# Patient Record
Sex: Male | Born: 1960
Health system: Southern US, Community
[De-identification: ages and names within clinical notes are randomized; demographics above are authoritative.]

## PROBLEM LIST (undated history)

## (undated) DIAGNOSIS — Z8701 Personal history of pneumonia (recurrent): Secondary | ICD-10-CM

## (undated) DIAGNOSIS — Z87442 Personal history of urinary calculi: Secondary | ICD-10-CM

## (undated) DIAGNOSIS — J189 Pneumonia, unspecified organism: Secondary | ICD-10-CM

## (undated) DIAGNOSIS — M1990 Unspecified osteoarthritis, unspecified site: Secondary | ICD-10-CM

## (undated) DIAGNOSIS — N200 Calculus of kidney: Secondary | ICD-10-CM

## (undated) DIAGNOSIS — I10 Essential (primary) hypertension: Secondary | ICD-10-CM

## (undated) DIAGNOSIS — R7303 Prediabetes: Secondary | ICD-10-CM

## (undated) DIAGNOSIS — E785 Hyperlipidemia, unspecified: Secondary | ICD-10-CM

## (undated) DIAGNOSIS — M519 Unspecified thoracic, thoracolumbar and lumbosacral intervertebral disc disorder: Secondary | ICD-10-CM

## (undated) DIAGNOSIS — I2510 Atherosclerotic heart disease of native coronary artery without angina pectoris: Secondary | ICD-10-CM

## (undated) HISTORY — DX: Essential (primary) hypertension: I10

## (undated) HISTORY — DX: Calculus of kidney: N20.0

---
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
## 2007-09-10 ENCOUNTER — Ambulatory Visit (HOSPITAL_COMMUNITY): Admission: RE | Admit: 2007-09-10 | Discharge: 2007-09-10 | Payer: Self-pay | Admitting: Pulmonary Disease

## 2008-05-21 ENCOUNTER — Emergency Department (HOSPITAL_COMMUNITY): Admission: EM | Admit: 2008-05-21 | Discharge: 2008-05-21 | Payer: Self-pay | Admitting: Emergency Medicine

## 2011-09-08 LAB — URINALYSIS, ROUTINE W REFLEX MICROSCOPIC
Bilirubin Urine: NEGATIVE
Glucose, UA: NEGATIVE
Ketones, ur: NEGATIVE
Leukocytes, UA: NEGATIVE
Nitrite: NEGATIVE
Protein, ur: NEGATIVE
Specific Gravity, Urine: >1.03 — ABNORMAL HIGH
Urobilinogen, UA: 0.2
pH: 5

## 2011-09-08 LAB — URINE MICROSCOPIC-ADD ON

## 2017-09-04 ENCOUNTER — Encounter: Payer: Self-pay | Admitting: Family Medicine

## 2017-09-04 ENCOUNTER — Ambulatory Visit (INDEPENDENT_AMBULATORY_CARE_PROVIDER_SITE_OTHER): Payer: 59 | Admitting: Family Medicine

## 2017-09-04 VITALS — BP 156/100 | Ht 68.0 in | Wt 246.1 lb

## 2017-09-04 DIAGNOSIS — I10 Essential (primary) hypertension: Secondary | ICD-10-CM | POA: Diagnosis not present

## 2017-09-04 DIAGNOSIS — R5383 Other fatigue: Secondary | ICD-10-CM | POA: Diagnosis not present

## 2017-09-04 MED ORDER — AMLODIPINE BESYLATE 5 MG PO TABS: 5.0000 mg | ORAL_TABLET | Freq: Every day | ORAL | 5 refills | Status: DC

## 2017-09-04 NOTE — Patient Instructions (Signed)
DASH Eating Plan DASH stands for "Dietary Approaches to Stop Hypertension." The DASH eating plan is a healthy eating plan that has been shown to reduce high blood pressure (hypertension). It may also reduce your risk for type 2 diabetes, heart disease, and stroke. The DASH eating plan may also help with weight loss. What are tips for following this plan? General guidelines  Avoid eating more than 2,300 mg (milligrams) of salt (sodium) a day. If you have hypertension, you may need to reduce your sodium intake to 1,500 mg a day.  Limit alcohol intake to no more than 1 drink a day for nonpregnant women and 2 drinks a day for men. One drink equals 12 oz of beer, 5 oz of wine, or 1 oz of hard liquor.  Work with your health care provider to maintain a healthy body weight or to lose weight. Ask what an ideal weight is for you.  Get at least 30 minutes of exercise that causes your heart to beat faster (aerobic exercise) most days of the week. Activities may include walking, swimming, or biking.  Work with your health care provider or diet and nutrition specialist (dietitian) to adjust your eating plan to your individual calorie needs. Reading food labels  Check food labels for the amount of sodium per serving. Choose foods with less than 5 percent of the Daily Value of sodium. Generally, foods with less than 300 mg of sodium per serving fit into this eating plan.  To find whole grains, look for the word "whole" as the first word in the ingredient list. Shopping  Buy products labeled as "low-sodium" or "no salt added."  Buy fresh foods. Avoid canned foods and premade or frozen meals. Cooking  Avoid adding salt when cooking. Use salt-free seasonings or herbs instead of table salt or sea salt. Check with your health care provider or pharmacist before using salt substitutes.  Do not fry foods. Cook foods using healthy methods such as baking, boiling, grilling, and broiling instead.  Cook with  heart-healthy oils, such as olive, canola, soybean, or sunflower oil. Meal planning   Eat a balanced diet that includes: ? 5 or more servings of fruits and vegetables each day. At each meal, try to fill half of your plate with fruits and vegetables. ? Up to 6-8 servings of whole grains each day. ? Less than 6 oz of lean meat, poultry, or fish each day. A 3-oz serving of meat is about the same size as a deck of cards. One egg equals 1 oz. ? 2 servings of low-fat dairy each day. ? A serving of nuts, seeds, or beans 5 times each week. ? Heart-healthy fats. Healthy fats called Omega-3 fatty acids are found in foods such as flaxseeds and coldwater fish, like sardines, salmon, and mackerel.  Limit how much you eat of the following: ? Canned or prepackaged foods. ? Food that is high in trans fat, such as fried foods. ? Food that is high in saturated fat, such as fatty meat. ? Sweets, desserts, sugary drinks, and other foods with added sugar. ? Full-fat dairy products.  Do not salt foods before eating.  Try to eat at least 2 vegetarian meals each week.  Eat more home-cooked food and less restaurant, buffet, and fast food.  When eating at a restaurant, ask that your food be prepared with less salt or no salt, if possible. What foods are recommended? The items listed may not be a complete list. Talk with your dietitian about what   dietary choices are best for you. Grains Whole-grain or whole-wheat bread. Whole-grain or whole-wheat pasta. Brown rice. Oatmeal. Quinoa. Bulgur. Whole-grain and low-sodium cereals. Pita bread. Low-fat, low-sodium crackers. Whole-wheat flour tortillas. Vegetables Fresh or frozen vegetables (raw, steamed, roasted, or grilled). Low-sodium or reduced-sodium tomato and vegetable juice. Low-sodium or reduced-sodium tomato sauce and tomato paste. Low-sodium or reduced-sodium canned vegetables. Fruits All fresh, dried, or frozen fruit. Canned fruit in natural juice (without  added sugar). Meat and other protein foods Skinless chicken or turkey. Ground chicken or turkey. Pork with fat trimmed off. Fish and seafood. Egg whites. Dried beans, peas, or lentils. Unsalted nuts, nut butters, and seeds. Unsalted canned beans. Lean cuts of beef with fat trimmed off. Low-sodium, lean deli meat. Dairy Low-fat (1%) or fat-free (skim) milk. Fat-free, low-fat, or reduced-fat cheeses. Nonfat, low-sodium ricotta or cottage cheese. Low-fat or nonfat yogurt. Low-fat, low-sodium cheese. Fats and oils Soft margarine without trans fats. Vegetable oil. Low-fat, reduced-fat, or light mayonnaise and salad dressings (reduced-sodium). Canola, safflower, olive, soybean, and sunflower oils. Avocado. Seasoning and other foods Herbs. Spices. Seasoning mixes without salt. Unsalted popcorn and pretzels. Fat-free sweets. What foods are not recommended? The items listed may not be a complete list. Talk with your dietitian about what dietary choices are best for you. Grains Baked goods made with fat, such as croissants, muffins, or some breads. Dry pasta or rice meal packs. Vegetables Creamed or fried vegetables. Vegetables in a cheese sauce. Regular canned vegetables (not low-sodium or reduced-sodium). Regular canned tomato sauce and paste (not low-sodium or reduced-sodium). Regular tomato and vegetable juice (not low-sodium or reduced-sodium). Pickles. Olives. Fruits Canned fruit in a light or heavy syrup. Fried fruit. Fruit in cream or butter sauce. Meat and other protein foods Fatty cuts of meat. Ribs. Fried meat. Bacon. Sausage. Bologna and other processed lunch meats. Salami. Fatback. Hotdogs. Bratwurst. Salted nuts and seeds. Canned beans with added salt. Canned or smoked fish. Whole eggs or egg yolks. Chicken or turkey with skin. Dairy Whole or 2% milk, cream, and half-and-half. Whole or full-fat cream cheese. Whole-fat or sweetened yogurt. Full-fat cheese. Nondairy creamers. Whipped toppings.  Processed cheese and cheese spreads. Fats and oils Butter. Stick margarine. Lard. Shortening. Ghee. Bacon fat. Tropical oils, such as coconut, palm kernel, or palm oil. Seasoning and other foods Salted popcorn and pretzels. Onion salt, garlic salt, seasoned salt, table salt, and sea salt. Worcestershire sauce. Tartar sauce. Barbecue sauce. Teriyaki sauce. Soy sauce, including reduced-sodium. Steak sauce. Canned and packaged gravies. Fish sauce. Oyster sauce. Cocktail sauce. Horseradish that you find on the shelf. Ketchup. Mustard. Meat flavorings and tenderizers. Bouillon cubes. Hot sauce and Tabasco sauce. Premade or packaged marinades. Premade or packaged taco seasonings. Relishes. Regular salad dressings. Where to find more information:  National Heart, Lung, and Blood Institute: www.nhlbi.nih.gov  American Heart Association: www.heart.org Summary  The DASH eating plan is a healthy eating plan that has been shown to reduce high blood pressure (hypertension). It may also reduce your risk for type 2 diabetes, heart disease, and stroke.  With the DASH eating plan, you should limit salt (sodium) intake to 2,300 mg a day. If you have hypertension, you may need to reduce your sodium intake to 1,500 mg a day.  When on the DASH eating plan, aim to eat more fresh fruits and vegetables, whole grains, lean proteins, low-fat dairy, and heart-healthy fats.  Work with your health care provider or diet and nutrition specialist (dietitian) to adjust your eating plan to your individual   calorie needs. This information is not intended to replace advice given to you by your health care provider. Make sure you discuss any questions you have with your health care provider. Document Released: 11/17/2011 Document Revised: 11/21/2016 Document Reviewed: 11/21/2016 Elsevier Interactive Patient Education  2017 [employer]  

## 2017-09-04 NOTE — Progress Notes (Signed)
   Subjective:    Patient ID: Jeremy Riley, male    DOB: August 06, 1961, 56 y.o.   MRN: [id number]  Hypertension  This is a new problem. The current episode started in the past 7 days. Pertinent negatives include no chest pain, headaches or shortness of breath. (Nausea, dizziness )   The patient reports that the past few weeks at times feels a little woozy. Has noticed that blood pressures been up when he checked it. Did not have a history of this. Remote history of smoking but now uses chewing tobacco He denies any chest tightness pressure pain but does relate some fatigue tiredness. No prior history of blood pressure issues. No family history of blood pressure does not exercise consumes too much salt Patient states no other concerns this visit.   Review of Systems  Constitutional: Negative for activity change, fatigue and fever.  Respiratory: Negative for cough and shortness of breath.   Cardiovascular: Negative for chest pain and leg swelling.  Neurological: Negative for headaches.       Objective:   Physical Exam  Constitutional: He appears well-nourished. No distress.  Cardiovascular: Normal rate, regular rhythm and normal heart sounds.   No murmur heard. Pulmonary/Chest: Effort normal and breath sounds normal. No respiratory distress.  Musculoskeletal: He exhibits no edema.  Lymphadenopathy:    He has no cervical adenopathy.  Neurological: He is alert.  Psychiatric: His behavior is normal.  Vitals reviewed.    Patient denies any angina symptoms     Assessment & Plan:  Elevated blood pressure Will start amlodipine 5 mg daily Will do EKG to rule out acute changes Lab work indicated We'll be following up in several weeks Dietary activity education given.

## 2017-09-05 ENCOUNTER — Encounter: Payer: Self-pay | Admitting: Family Medicine

## 2017-09-09 LAB — CBC WITH DIFFERENTIAL/PLATELET
Basophils Absolute: 0.1 10*3/uL (ref 0.0–0.2)
Basos: 2 %
EOS (ABSOLUTE): 0.3 10*3/uL (ref 0.0–0.4)
Eos: 5 %
Hematocrit: 46.3 % (ref 37.5–51.0)
Hemoglobin: 16.2 g/dL (ref 13.0–17.7)
Immature Grans (Abs): 0 10*3/uL (ref 0.0–0.1)
Immature Granulocytes: 0 %
Lymphocytes Absolute: 1.9 10*3/uL (ref 0.7–3.1)
Lymphs: 33 %
MCH: 30.7 pg (ref 26.6–33.0)
MCHC: 35 g/dL (ref 31.5–35.7)
MCV: 88 fL (ref 79–97)
Monocytes Absolute: 0.5 10*3/uL (ref 0.1–0.9)
Monocytes: 8 %
Neutrophils Absolute: 3.1 10*3/uL (ref 1.4–7.0)
Neutrophils: 52 %
Platelets: 221 10*3/uL (ref 150–379)
RBC: 5.28 x10E6/uL (ref 4.14–5.80)
RDW: 13.8 % (ref 12.3–15.4)
WBC: 5.8 10*3/uL (ref 3.4–10.8)

## 2017-09-09 LAB — BASIC METABOLIC PANEL
BUN/Creatinine Ratio: 11 (ref 9–20)
BUN: 12 mg/dL (ref 6–24)
CO2: 26 mmol/L (ref 20–29)
Calcium: 9.7 mg/dL (ref 8.7–10.2)
Chloride: 103 mmol/L (ref 96–106)
Creatinine, Ser: 1.11 mg/dL (ref 0.76–1.27)
GFR calc Af Amer: 85 mL/min/{1.73_m2} (ref >59)
GFR calc non Af Amer: 74 mL/min/{1.73_m2} (ref >59)
Glucose: 113 mg/dL — ABNORMAL HIGH (ref 65–99)
Potassium: 4.6 mmol/L (ref 3.5–5.2)
Sodium: 143 mmol/L (ref 134–144)

## 2017-09-09 LAB — HEPATIC FUNCTION PANEL
ALT: 28 IU/L (ref 0–44)
AST: 24 IU/L (ref 0–40)
Albumin: 4.7 g/dL (ref 3.5–5.5)
Alkaline Phosphatase: 66 IU/L (ref 39–117)
Bilirubin Total: 0.6 mg/dL (ref 0.0–1.2)
Bilirubin, Direct: 0.18 mg/dL (ref 0.00–0.40)
Total Protein: 7.2 g/dL (ref 6.0–8.5)

## 2017-09-09 LAB — LIPID PANEL
Chol/HDL Ratio: 4.9 ratio (ref 0.0–5.0)
Cholesterol, Total: 194 mg/dL (ref 100–199)
HDL: 40 mg/dL (ref >39)
LDL Calculated: 125 mg/dL — ABNORMAL HIGH (ref 0–99)
Triglycerides: 145 mg/dL (ref 0–149)
VLDL Cholesterol Cal: 29 mg/dL (ref 5–40)

## 2017-09-09 LAB — PSA: Prostate Specific Ag, Serum: 1.7 ng/mL (ref 0.0–4.0)

## 2017-09-10 ENCOUNTER — Encounter: Payer: Self-pay | Admitting: Family Medicine

## 2017-10-03 ENCOUNTER — Encounter: Payer: Self-pay | Admitting: Family Medicine

## 2017-10-03 ENCOUNTER — Ambulatory Visit (INDEPENDENT_AMBULATORY_CARE_PROVIDER_SITE_OTHER): Payer: 59 | Admitting: Family Medicine

## 2017-10-03 VITALS — BP 146/96 | Ht 68.0 in | Wt 253.1 lb

## 2017-10-03 DIAGNOSIS — Z23 Encounter for immunization: Secondary | ICD-10-CM | POA: Diagnosis not present

## 2017-10-03 DIAGNOSIS — R739 Hyperglycemia, unspecified: Secondary | ICD-10-CM | POA: Diagnosis not present

## 2017-10-03 DIAGNOSIS — I10 Essential (primary) hypertension: Secondary | ICD-10-CM

## 2017-10-03 LAB — POCT GLYCOSYLATED HEMOGLOBIN (HGB A1C): Hemoglobin A1C: 5.2

## 2017-10-03 MED ORDER — AMLODIPINE BESYLATE 10 MG PO TABS: 10.0000 mg | ORAL_TABLET | Freq: Every day | ORAL | 5 refills | Status: DC

## 2017-10-03 NOTE — Patient Instructions (Signed)
DASH Eating Plan DASH stands for "Dietary Approaches to Stop Hypertension." The DASH eating plan is a healthy eating plan that has been shown to reduce high blood pressure (hypertension). It may also reduce your risk for type 2 diabetes, heart disease, and stroke. The DASH eating plan may also help with weight loss. What are tips for following this plan? General guidelines  Avoid eating more than 2,300 mg (milligrams) of salt (sodium) a day. If you have hypertension, you may need to reduce your sodium intake to 1,500 mg a day.  Limit alcohol intake to no more than 1 drink a day for nonpregnant women and 2 drinks a day for men. One drink equals 12 oz of beer, 5 oz of wine, or 1 oz of hard liquor.  Work with your health care provider to maintain a healthy body weight or to lose weight. Ask what an ideal weight is for you.  Get at least 30 minutes of exercise that causes your heart to beat faster (aerobic exercise) most days of the week. Activities may include walking, swimming, or biking.  Work with your health care provider or diet and nutrition specialist (dietitian) to adjust your eating plan to your individual calorie needs. Reading food labels  Check food labels for the amount of sodium per serving. Choose foods with less than 5 percent of the Daily Value of sodium. Generally, foods with less than 300 mg of sodium per serving fit into this eating plan.  To find whole grains, look for the word "whole" as the first word in the ingredient list. Shopping  Buy products labeled as "low-sodium" or "no salt added."  Buy fresh foods. Avoid canned foods and premade or frozen meals. Cooking  Avoid adding salt when cooking. Use salt-free seasonings or herbs instead of table salt or sea salt. Check with your health care provider or pharmacist before using salt substitutes.  Do not fry foods. Cook foods using healthy methods such as baking, boiling, grilling, and broiling instead.  Cook with  heart-healthy oils, such as olive, canola, soybean, or sunflower oil. Meal planning   Eat a balanced diet that includes: ? 5 or more servings of fruits and vegetables each day. At each meal, try to fill half of your plate with fruits and vegetables. ? Up to 6-8 servings of whole grains each day. ? Less than 6 oz of lean meat, poultry, or fish each day. A 3-oz serving of meat is about the same size as a deck of cards. One egg equals 1 oz. ? 2 servings of low-fat dairy each day. ? A serving of nuts, seeds, or beans 5 times each week. ? Heart-healthy fats. Healthy fats called Omega-3 fatty acids are found in foods such as flaxseeds and coldwater fish, like sardines, salmon, and mackerel.  Limit how much you eat of the following: ? Canned or prepackaged foods. ? Food that is high in trans fat, such as fried foods. ? Food that is high in saturated fat, such as fatty meat. ? Sweets, desserts, sugary drinks, and other foods with added sugar. ? Full-fat dairy products.  Do not salt foods before eating.  Try to eat at least 2 vegetarian meals each week.  Eat more home-cooked food and less restaurant, buffet, and fast food.  When eating at a restaurant, ask that your food be prepared with less salt or no salt, if possible. What foods are recommended? The items listed may not be a complete list. Talk with your dietitian about what   dietary choices are best for you. Grains Whole-grain or whole-wheat bread. Whole-grain or whole-wheat pasta. Brown rice. Oatmeal. Quinoa. Bulgur. Whole-grain and low-sodium cereals. Pita bread. Low-fat, low-sodium crackers. Whole-wheat flour tortillas. Vegetables Fresh or frozen vegetables (raw, steamed, roasted, or grilled). Low-sodium or reduced-sodium tomato and vegetable juice. Low-sodium or reduced-sodium tomato sauce and tomato paste. Low-sodium or reduced-sodium canned vegetables. Fruits All fresh, dried, or frozen fruit. Canned fruit in natural juice (without  added sugar). Meat and other protein foods Skinless chicken or turkey. Ground chicken or turkey. Pork with fat trimmed off. Fish and seafood. Egg whites. Dried beans, peas, or lentils. Unsalted nuts, nut butters, and seeds. Unsalted canned beans. Lean cuts of beef with fat trimmed off. Low-sodium, lean deli meat. Dairy Low-fat (1%) or fat-free (skim) milk. Fat-free, low-fat, or reduced-fat cheeses. Nonfat, low-sodium ricotta or cottage cheese. Low-fat or nonfat yogurt. Low-fat, low-sodium cheese. Fats and oils Soft margarine without trans fats. Vegetable oil. Low-fat, reduced-fat, or light mayonnaise and salad dressings (reduced-sodium). Canola, safflower, olive, soybean, and sunflower oils. Avocado. Seasoning and other foods Herbs. Spices. Seasoning mixes without salt. Unsalted popcorn and pretzels. Fat-free sweets. What foods are not recommended? The items listed may not be a complete list. Talk with your dietitian about what dietary choices are best for you. Grains Baked goods made with fat, such as croissants, muffins, or some breads. Dry pasta or rice meal packs. Vegetables Creamed or fried vegetables. Vegetables in a cheese sauce. Regular canned vegetables (not low-sodium or reduced-sodium). Regular canned tomato sauce and paste (not low-sodium or reduced-sodium). Regular tomato and vegetable juice (not low-sodium or reduced-sodium). Pickles. Olives. Fruits Canned fruit in a light or heavy syrup. Fried fruit. Fruit in cream or butter sauce. Meat and other protein foods Fatty cuts of meat. Ribs. Fried meat. Bacon. Sausage. Bologna and other processed lunch meats. Salami. Fatback. Hotdogs. Bratwurst. Salted nuts and seeds. Canned beans with added salt. Canned or smoked fish. Whole eggs or egg yolks. Chicken or turkey with skin. Dairy Whole or 2% milk, cream, and half-and-half. Whole or full-fat cream cheese. Whole-fat or sweetened yogurt. Full-fat cheese. Nondairy creamers. Whipped toppings.  Processed cheese and cheese spreads. Fats and oils Butter. Stick margarine. Lard. Shortening. Ghee. Bacon fat. Tropical oils, such as coconut, palm kernel, or palm oil. Seasoning and other foods Salted popcorn and pretzels. Onion salt, garlic salt, seasoned salt, table salt, and sea salt. Worcestershire sauce. Tartar sauce. Barbecue sauce. Teriyaki sauce. Soy sauce, including reduced-sodium. Steak sauce. Canned and packaged gravies. Fish sauce. Oyster sauce. Cocktail sauce. Horseradish that you find on the shelf. Ketchup. Mustard. Meat flavorings and tenderizers. Bouillon cubes. Hot sauce and Tabasco sauce. Premade or packaged marinades. Premade or packaged taco seasonings. Relishes. Regular salad dressings. Where to find more information:  National Heart, Lung, and Blood Institute: www.nhlbi.nih.gov  American Heart Association: www.heart.org Summary  The DASH eating plan is a healthy eating plan that has been shown to reduce high blood pressure (hypertension). It may also reduce your risk for type 2 diabetes, heart disease, and stroke.  With the DASH eating plan, you should limit salt (sodium) intake to 2,300 mg a day. If you have hypertension, you may need to reduce your sodium intake to 1,500 mg a day.  When on the DASH eating plan, aim to eat more fresh fruits and vegetables, whole grains, lean proteins, low-fat dairy, and heart-healthy fats.  Work with your health care provider or diet and nutrition specialist (dietitian) to adjust your eating plan to your individual   calorie needs. This information is not intended to replace advice given to you by your health care provider. Make sure you discuss any questions you have with your health care provider. Document Released: 11/17/2011 Document Revised: 11/21/2016 Document Reviewed: 11/21/2016 Elsevier Interactive Patient Education  2017 [employer]  

## 2017-10-03 NOTE — Progress Notes (Signed)
   Subjective:    Patient ID: Jeremy Riley, male    DOB: [birth year], 56 y.o.   MRN: [id number]  Hypertension  This is a chronic problem. The current episode started more than 1 year ago. Pertinent negatives include no chest pain, headaches or shortness of breath.  Patient is taking his blood pressure medicine Needs Back on salt He is trying to lose some weight He is taking about doing some walking He also relates increasing his lab work. We reviewed over this including slight elevation of glucose Patient does have some moderate obesity Used lose weight  Patient states no other concerns this visit.  Review of Systems  Constitutional: Negative for activity change, fatigue and fever.  Respiratory: Negative for cough and shortness of breath.   Cardiovascular: Negative for chest pain and leg swelling.  Neurological: Negative for headaches.       Objective:   Physical Exam  Constitutional: He appears well-nourished. No distress.  Cardiovascular: Normal rate, regular rhythm and normal heart sounds.   No murmur heard. Pulmonary/Chest: Effort normal and breath sounds normal. No respiratory distress.  Musculoskeletal: He exhibits no edema.  Lymphadenopathy:    He has no cervical adenopathy.  Neurological: He is alert.  Psychiatric: His behavior is normal.  Vitals reviewed.   The goal is to get his blood pressure under 130/70 range we'll check it on a regular basis and send [place] some readings      Assessment & Plan:  Moderate obesity-cup portions cut calories increase activity HTN increase medication if pedal edema notify us may need to try other combination Flu vaccine today Follow-up within 3 months for preventative health Discussed colonoscopy and other options at that time

## 2018-01-05 ENCOUNTER — Encounter: Payer: 59 | Admitting: Family Medicine

## 2018-02-27 ENCOUNTER — Ambulatory Visit (INDEPENDENT_AMBULATORY_CARE_PROVIDER_SITE_OTHER): Payer: BLUE CROSS/BLUE SHIELD | Admitting: Family Medicine

## 2018-02-27 ENCOUNTER — Encounter: Payer: Self-pay | Admitting: Family Medicine

## 2018-02-27 VITALS — BP 170/108 | Temp 98.1°F | Ht 68.0 in | Wt 255.0 lb

## 2018-02-27 DIAGNOSIS — N200 Calculus of kidney: Secondary | ICD-10-CM | POA: Diagnosis not present

## 2018-02-27 DIAGNOSIS — R3129 Other microscopic hematuria: Secondary | ICD-10-CM

## 2018-02-27 DIAGNOSIS — M545 Low back pain: Secondary | ICD-10-CM | POA: Diagnosis not present

## 2018-02-27 LAB — POCT URINALYSIS DIPSTICK
Leukocytes, UA: NEGATIVE
Spec Grav, UA: 1.02 (ref 1.010–1.025)
pH, UA: 5 (ref 5.0–8.0)

## 2018-02-27 MED ORDER — HYDROCODONE-ACETAMINOPHEN 5-325MG PREPACK (~~LOC~~: ORAL_TABLET | ORAL | 0 refills | Status: DC

## 2018-02-27 MED ORDER — TAMSULOSIN HCL 0.4 MG PO CAPS: ORAL_CAPSULE | ORAL | 0 refills | Status: DC

## 2018-02-27 MED ORDER — ONDANSETRON 4 MG PO TBDP: ORAL_TABLET | ORAL | 0 refills | Status: DC

## 2018-02-27 NOTE — Progress Notes (Signed)
   Subjective:    Patient ID: Jeremy Riley, male    DOB: [birth year], 57 y.o.   MRN: [id number]  HPI Patient is here today with complaints of lower back pain that started Sunday. He states he has had a hx of kidney stones and this is what it feels like. He thinks it is moving as he is in more pain today than he has been. Results for orders placed or performed in visit on 02/27/18  POCT urinalysis dipstick  Result Value Ref Range   Color, UA     Clarity, UA     Glucose, UA     Bilirubin, UA     Ketones, UA     Spec Grav, UA 1.020 1.010 - 1.025   Blood, UA trace    pH, UA 5.0 5.0 - 8.0   Protein, UA     Urobilinogen, UA  0.2 or 1.0 E.U./dL   Nitrite, UA     Leukocytes, UA Negative Negative   Appearance     Odor     pretty bad pain severe in nature  Pt taking hydrocodone Helps a bit    Takes phosfoos suppplement   Patient notes substantial nausea.  Left flank pain.  Radiating somewhat to the lateral abdomen.  Some nausea.  No vomiting.  Pain severe at times.  Took 800 mg ibuprofen did not help.  Known history of prior stones.  Exact nature stone never identified.  Prior ER report and CT scan reviewed today in presence of patient      Review of Systems No headache, no major weight loss or weight gain, no chest pain no back pain abdominal pain no change in bowel habits complete ROS otherwise negative     Objective:   Physical Exam  Alert and oriented, vitals reviewed and stable, NAD ENT-TM's and ext canals WNL bilat via otoscopic exam Soft palate, tonsils and post pharynx WNL via oropharyngeal exam Neck-symmetric, no masses; thyroid nonpalpable and nontender Pulmonary-no tachypnea or accessory muscle use; Clear without wheezes via auscultation Card--no abnrml murmurs, rhythm reg and rate WNL Carotid pulses symmetric, without bruits   Positive left flank tenderness mild left lateral abdominal tenderness excellent bowel sounds no rebound no guarding  Urinalysis  2-4 red blood cells per high-power field      Assessment & Plan:  Impression 1 probable acute left-sided kidney stone.  Discussed at length.  Will initiate Flomax.  Will prescribe hydrocodone.  Importance of using ibuprofen underlying to patient.  Screen urine.  Bring patient back stone.  Zofran as needed for nausea.  Warning signs discussed.  Increase hydration.  Hopefully will pass stone without major intervention discussed at length multiple questions answered  Greater than 50% of this 25 minute face to face visit was spent in counseling and discussion and coordination of care regarding the above diagnosis/diagnosies

## 2018-03-10 ENCOUNTER — Other Ambulatory Visit: Payer: Self-pay | Admitting: Family Medicine

## 2018-03-21 ENCOUNTER — Other Ambulatory Visit: Payer: Self-pay | Admitting: Family Medicine

## 2018-04-26 ENCOUNTER — Telehealth: Payer: Self-pay | Admitting: Family Medicine

## 2018-04-26 MED ORDER — AMLODIPINE BESYLATE 5 MG PO TABS: 5.0000 mg | ORAL_TABLET | Freq: Every day | ORAL | 1 refills | Status: DC

## 2018-04-26 NOTE — Telephone Encounter (Signed)
Wife is aware and new rx sent in to [facility].

## 2018-04-26 NOTE — Telephone Encounter (Signed)
So this is a potential side effect of the medication.  It is more common with 10 mg than it is with 5 mg.  So therefore I would recommend reducing it to 5 mg tablet.  But then we will need to do a follow-up visit to assess how his blood pressure is doing because he may need to be placed on additional meds.  Stay physically active.  Minimize salt in the diet.  May use 5 mg tablet, #30, 1 daily, 1 refill, recommend follow-up office visit somewhere within the next 2  weeks to reassess blood pressure- swelling issues should gradually go to with the transition to 5 mg

## 2018-04-26 NOTE — Telephone Encounter (Signed)
Wife thinks that Blood pressure medicine is causing excessive fluid retention in his legs and ankles. (She said this is a side effect of Norvasc)  She has been giving him otc fluid pills without a whole lot of results. BP has been good.  She is worried about his swelling and not sure what to do. Change BP meds?  [facility]

## 2018-04-26 NOTE — Telephone Encounter (Signed)
Called patient. He states no swelling in legs just in ankles and feet and it started after he was put on norvasc.

## 2018-05-11 ENCOUNTER — Encounter: Payer: Self-pay | Admitting: Family Medicine

## 2018-05-11 ENCOUNTER — Ambulatory Visit (INDEPENDENT_AMBULATORY_CARE_PROVIDER_SITE_OTHER): Payer: BLUE CROSS/BLUE SHIELD | Admitting: Family Medicine

## 2018-05-11 VITALS — BP 148/98 | Ht 68.0 in | Wt 253.1 lb

## 2018-05-11 DIAGNOSIS — Z131 Encounter for screening for diabetes mellitus: Secondary | ICD-10-CM

## 2018-05-11 DIAGNOSIS — R600 Localized edema: Secondary | ICD-10-CM | POA: Diagnosis not present

## 2018-05-11 DIAGNOSIS — I10 Essential (primary) hypertension: Secondary | ICD-10-CM

## 2018-05-11 DIAGNOSIS — Z13220 Encounter for screening for lipoid disorders: Secondary | ICD-10-CM

## 2018-05-11 DIAGNOSIS — Z125 Encounter for screening for malignant neoplasm of prostate: Secondary | ICD-10-CM

## 2018-05-11 MED ORDER — POTASSIUM CHLORIDE ER 10 MEQ PO TBCR: 10.0000 meq | EXTENDED_RELEASE_TABLET | Freq: Every day | ORAL | 4 refills | Status: DC

## 2018-05-11 MED ORDER — HYDROCHLOROTHIAZIDE 12.5 MG PO TABS: 12.5000 mg | ORAL_TABLET | Freq: Every day | ORAL | 4 refills | Status: DC

## 2018-05-11 MED ORDER — AMLODIPINE BESYLATE 5 MG PO TABS: 5.0000 mg | ORAL_TABLET | Freq: Every day | ORAL | 4 refills | Status: DC

## 2018-05-11 NOTE — Progress Notes (Signed)
   Subjective:    Patient ID: Jeremy Riley, male    DOB: 1961/09/13, 57 y.o.   MRN: [id number]  HPI  Patient is here today to follow up on chronic health issues.He states he has been having some bilateral foot edema. States since he has been on Norvasc he has had this problem since starting. It was at 10 mg and was decreased to 5 mg. We will reduce the medication to 5 mg of his blood pressure is gone mildly elevated he is doing some walking but on the days he works he is so fatigued he cannot fit in any extra walking in addition to this he denies wheezing difficulty breathing sweats chills vomiting  Review of Systems  Constitutional: Negative for activity change, fatigue and fever.  HENT: Negative for congestion and rhinorrhea.   Respiratory: Negative for cough and shortness of breath.   Cardiovascular: Negative for chest pain and leg swelling.  Gastrointestinal: Negative for abdominal pain, diarrhea and nausea.  Genitourinary: Negative for dysuria and hematuria.  Neurological: Negative for weakness and headaches.  Psychiatric/Behavioral: Negative for behavioral problems.       Objective:   Physical Exam  Constitutional: He appears well-nourished. No distress.  Cardiovascular: Normal rate, regular rhythm and normal heart sounds.  No murmur heard. Pulmonary/Chest: Effort normal and breath sounds normal. No respiratory distress.  Musculoskeletal: He exhibits no edema.  Lymphadenopathy:    He has no cervical adenopathy.  Neurological: He is alert.  Psychiatric: His behavior is normal.  Vitals reviewed.         Assessment & Plan:  Very nice gentleman HTN with pedal edema Diuretic low-dose on a regular basis Amlodipine 5 mg Screening lab work recommended for wellness exam later this year Recent kidney stone they will bring the stone in for analysis

## 2018-05-11 NOTE — Patient Instructions (Signed)
DASH Eating Plan DASH stands for "Dietary Approaches to Stop Hypertension." The DASH eating plan is a healthy eating plan that has been shown to reduce high blood pressure (hypertension). It may also reduce your risk for type 2 diabetes, heart disease, and stroke. The DASH eating plan may also help with weight loss. What are tips for following this plan? General guidelines  Avoid eating more than 2,300 mg (milligrams) of salt (sodium) a day. If you have hypertension, you may need to reduce your sodium intake to 1,500 mg a day.  Limit alcohol intake to no more than 1 drink a day for nonpregnant women and 2 drinks a day for men. One drink equals 12 oz of beer, 5 oz of wine, or 1 oz of hard liquor.  Work with your health care provider to maintain a healthy body weight or to lose weight. Ask what an ideal weight is for you.  Get at least 30 minutes of exercise that causes your heart to beat faster (aerobic exercise) most days of the week. Activities may include walking, swimming, or biking.  Work with your health care provider or diet and nutrition specialist (dietitian) to adjust your eating plan to your individual calorie needs. Reading food labels  Check food labels for the amount of sodium per serving. Choose foods with less than 5 percent of the Daily Value of sodium. Generally, foods with less than 300 mg of sodium per serving fit into this eating plan.  To find whole grains, look for the word "whole" as the first word in the ingredient list. Shopping  Buy products labeled as "low-sodium" or "no salt added."  Buy fresh foods. Avoid canned foods and premade or frozen meals. Cooking  Avoid adding salt when cooking. Use salt-free seasonings or herbs instead of table salt or sea salt. Check with your health care provider or pharmacist before using salt substitutes.  Do not fry foods. Cook foods using healthy methods such as baking, boiling, grilling, and broiling instead.  Cook with  heart-healthy oils, such as olive, canola, soybean, or sunflower oil. Meal planning   Eat a balanced diet that includes: ? 5 or more servings of fruits and vegetables each day. At each meal, try to fill half of your plate with fruits and vegetables. ? Up to 6-8 servings of whole grains each day. ? Less than 6 oz of lean meat, poultry, or fish each day. A 3-oz serving of meat is about the same size as a deck of cards. One egg equals 1 oz. ? 2 servings of low-fat dairy each day. ? A serving of nuts, seeds, or beans 5 times each week. ? Heart-healthy fats. Healthy fats called Omega-3 fatty acids are found in foods such as flaxseeds and coldwater fish, like sardines, salmon, and mackerel.  Limit how much you eat of the following: ? Canned or prepackaged foods. ? Food that is high in trans fat, such as fried foods. ? Food that is high in saturated fat, such as fatty meat. ? Sweets, desserts, sugary drinks, and other foods with added sugar. ? Full-fat dairy products.  Do not salt foods before eating.  Try to eat at least 2 vegetarian meals each week.  Eat more home-cooked food and less restaurant, buffet, and fast food.  When eating at a restaurant, ask that your food be prepared with less salt or no salt, if possible. What foods are recommended? The items listed may not be a complete list. Talk with your dietitian about what   dietary choices are best for you. Grains Whole-grain or whole-wheat bread. Whole-grain or whole-wheat pasta. Brown rice. Oatmeal. Quinoa. Bulgur. Whole-grain and low-sodium cereals. Pita bread. Low-fat, low-sodium crackers. Whole-wheat flour tortillas. Vegetables Fresh or frozen vegetables (raw, steamed, roasted, or grilled). Low-sodium or reduced-sodium tomato and vegetable juice. Low-sodium or reduced-sodium tomato sauce and tomato paste. Low-sodium or reduced-sodium canned vegetables. Fruits All fresh, dried, or frozen fruit. Canned fruit in natural juice (without  added sugar). Meat and other protein foods Skinless chicken or turkey. Ground chicken or turkey. Pork with fat trimmed off. Fish and seafood. Egg whites. Dried beans, peas, or lentils. Unsalted nuts, nut butters, and seeds. Unsalted canned beans. Lean cuts of beef with fat trimmed off. Low-sodium, lean deli meat. Dairy Low-fat (1%) or fat-free (skim) milk. Fat-free, low-fat, or reduced-fat cheeses. Nonfat, low-sodium ricotta or cottage cheese. Low-fat or nonfat yogurt. Low-fat, low-sodium cheese. Fats and oils Soft margarine without trans fats. Vegetable oil. Low-fat, reduced-fat, or light mayonnaise and salad dressings (reduced-sodium). Canola, safflower, olive, soybean, and sunflower oils. Avocado. Seasoning and other foods Herbs. Spices. Seasoning mixes without salt. Unsalted popcorn and pretzels. Fat-free sweets. What foods are not recommended? The items listed may not be a complete list. Talk with your dietitian about what dietary choices are best for you. Grains Baked goods made with fat, such as croissants, muffins, or some breads. Dry pasta or rice meal packs. Vegetables Creamed or fried vegetables. Vegetables in a cheese sauce. Regular canned vegetables (not low-sodium or reduced-sodium). Regular canned tomato sauce and paste (not low-sodium or reduced-sodium). Regular tomato and vegetable juice (not low-sodium or reduced-sodium). Pickles. Olives. Fruits Canned fruit in a light or heavy syrup. Fried fruit. Fruit in cream or butter sauce. Meat and other protein foods Fatty cuts of meat. Ribs. Fried meat. Bacon. Sausage. Bologna and other processed lunch meats. Salami. Fatback. Hotdogs. Bratwurst. Salted nuts and seeds. Canned beans with added salt. Canned or smoked fish. Whole eggs or egg yolks. Chicken or turkey with skin. Dairy Whole or 2% milk, cream, and half-and-half. Whole or full-fat cream cheese. Whole-fat or sweetened yogurt. Full-fat cheese. Nondairy creamers. Whipped toppings.  Processed cheese and cheese spreads. Fats and oils Butter. Stick margarine. Lard. Shortening. Ghee. Bacon fat. Tropical oils, such as coconut, palm kernel, or palm oil. Seasoning and other foods Salted popcorn and pretzels. Onion salt, garlic salt, seasoned salt, table salt, and sea salt. Worcestershire sauce. Tartar sauce. Barbecue sauce. Teriyaki sauce. Soy sauce, including reduced-sodium. Steak sauce. Canned and packaged gravies. Fish sauce. Oyster sauce. Cocktail sauce. Horseradish that you find on the shelf. Ketchup. Mustard. Meat flavorings and tenderizers. Bouillon cubes. Hot sauce and Tabasco sauce. Premade or packaged marinades. Premade or packaged taco seasonings. Relishes. Regular salad dressings. Where to find more information:  National Heart, Lung, and Blood Institute: www.nhlbi.nih.gov  American Heart Association: www.heart.org Summary  The DASH eating plan is a healthy eating plan that has been shown to reduce high blood pressure (hypertension). It may also reduce your risk for type 2 diabetes, heart disease, and stroke.  With the DASH eating plan, you should limit salt (sodium) intake to 2,300 mg a day. If you have hypertension, you may need to reduce your sodium intake to 1,500 mg a day.  When on the DASH eating plan, aim to eat more fresh fruits and vegetables, whole grains, lean proteins, low-fat dairy, and heart-healthy fats.  Work with your health care provider or diet and nutrition specialist (dietitian) to adjust your eating plan to your individual   calorie needs. This information is not intended to replace advice given to you by your health care provider. Make sure you discuss any questions you have with your health care provider. Document Released: 11/17/2011 Document Revised: 11/21/2016 Document Reviewed: 11/21/2016 Elsevier Interactive Patient Education  2018 Elsevier Inc.  

## 2018-08-06 ENCOUNTER — Other Ambulatory Visit: Payer: Self-pay | Admitting: Family Medicine

## 2018-08-29 ENCOUNTER — Encounter: Payer: BLUE CROSS/BLUE SHIELD | Admitting: Family Medicine

## 2018-09-21 ENCOUNTER — Ambulatory Visit (INDEPENDENT_AMBULATORY_CARE_PROVIDER_SITE_OTHER): Payer: BLUE CROSS/BLUE SHIELD | Admitting: Family Medicine

## 2018-09-21 ENCOUNTER — Encounter: Payer: Self-pay | Admitting: Family Medicine

## 2018-09-21 VITALS — BP 136/86 | Ht 68.0 in | Wt 254.8 lb

## 2018-09-21 DIAGNOSIS — Z0000 Encounter for general adult medical examination without abnormal findings: Secondary | ICD-10-CM

## 2018-09-21 NOTE — Progress Notes (Signed)
   Subjective:    Patient ID: Jeremy Riley, male    DOB: [birth year], 57 y.o.   MRN: [id number]  HPI The patient comes in today for a wellness visit.  Patient comes in for wellness He does use tobacco It is using it as a smokeless We did counsel the patient about quitting smoking He denies excessive use of alcohol He does play golf he tries to stay active he denies any accidents injuries Denies depression   A review of their health history was completed.  A review of medications was also completed.  Any needed refills; none  Eating habits: not health conscious  Falls/  MVA accidents in past few months: none  Regular exercise: none  Specialist pt sees on regular basis: none  Preventative health issues were discussed.   Additional concerns: none  Declines flu vaccine today.    Review of Systems  Constitutional: Negative for activity change, appetite change and fever.  HENT: Negative for congestion and rhinorrhea.   Eyes: Negative for discharge.  Respiratory: Negative for cough and wheezing.   Cardiovascular: Negative for chest pain.  Gastrointestinal: Negative for abdominal pain, blood in stool and vomiting.  Genitourinary: Negative for difficulty urinating and frequency.  Musculoskeletal: Negative for neck pain.  Skin: Negative for rash.  Allergic/Immunologic: Negative for environmental allergies and food allergies.  Neurological: Negative for weakness and headaches.  Psychiatric/Behavioral: Negative for agitation.       Objective:   Physical Exam  Constitutional: He appears well-developed and well-nourished.  HENT:  Head: Normocephalic and atraumatic.  Right Ear: External ear normal.  Left Ear: External ear normal.  Nose: Nose normal.  Mouth/Throat: Oropharynx is clear and moist.  Eyes: Pupils are equal, round, and reactive to light. EOM are normal.  Neck: Normal range of motion. Neck supple. No thyromegaly present.  Cardiovascular: Normal rate, regular  rhythm and normal heart sounds.  No murmur heard. Pulmonary/Chest: Effort normal and breath sounds normal. No respiratory distress. He has no wheezes.  Abdominal: Soft. Bowel sounds are normal. He exhibits no distension and no mass. There is no tenderness.  Genitourinary: Penis normal.  Musculoskeletal: Normal range of motion. He exhibits no edema.  Lymphadenopathy:    He has no cervical adenopathy.  Neurological: He is alert. He exhibits normal muscle tone.  Skin: Skin is warm and dry. No erythema.  Psychiatric: He has a normal mood and affect. His behavior is normal. Judgment normal.          Assessment & Plan:  Adult wellness-complete.wellness physical was conducted today. Importance of diet and exercise were discussed in detail.  In addition to this a discussion regarding safety was also covered. We also reviewed over immunizations and gave recommendations regarding current immunization needed for age.  In addition to this additional areas were also touched on including: Preventative health exams needed:  Colonoscopy patient defers on colonoscopy will do stool testing for blood  Patient was advised yearly wellness exam We did discuss quitting tobacco products Also discussed the importance of minimizing alcohol Also discussed trying to lose some weight and regular physical activity  The patient never did do his lab work he was recommended highly to get this done he stated that he will work toward trying to get this completed  He will do a regular follow-up regarding blood pressure and cholesterol within the next several months

## 2018-09-21 NOTE — Patient Instructions (Signed)
DASH Eating Plan DASH stands for "Dietary Approaches to Stop Hypertension." The DASH eating plan is a healthy eating plan that has been shown to reduce high blood pressure (hypertension). It may also reduce your risk for type 2 diabetes, heart disease, and stroke. The DASH eating plan may also help with weight loss. What are tips for following this plan? General guidelines  Avoid eating more than 2,300 mg (milligrams) of salt (sodium) a day. If you have hypertension, you may need to reduce your sodium intake to 1,500 mg a day.  Limit alcohol intake to no more than 1 drink a day for nonpregnant women and 2 drinks a day for men. One drink equals 12 oz of beer, 5 oz of wine, or 1 oz of hard liquor.  Work with your health care provider to maintain a healthy body weight or to lose weight. Ask what an ideal weight is for you.  Get at least 30 minutes of exercise that causes your heart to beat faster (aerobic exercise) most days of the week. Activities may include walking, swimming, or biking.  Work with your health care provider or diet and nutrition specialist (dietitian) to adjust your eating plan to your individual calorie needs. Reading food labels  Check food labels for the amount of sodium per serving. Choose foods with less than 5 percent of the Daily Value of sodium. Generally, foods with less than 300 mg of sodium per serving fit into this eating plan.  To find whole grains, look for the word "whole" as the first word in the ingredient list. Shopping  Buy products labeled as "low-sodium" or "no salt added."  Buy fresh foods. Avoid canned foods and premade or frozen meals. Cooking  Avoid adding salt when cooking. Use salt-free seasonings or herbs instead of table salt or sea salt. Check with your health care provider or pharmacist before using salt substitutes.  Do not fry foods. Cook foods using healthy methods such as baking, boiling, grilling, and broiling instead.  Cook with  heart-healthy oils, such as olive, canola, soybean, or sunflower oil. Meal planning   Eat a balanced diet that includes: ? 5 or more servings of fruits and vegetables each day. At each meal, try to fill half of your plate with fruits and vegetables. ? Up to 6-8 servings of whole grains each day. ? Less than 6 oz of lean meat, poultry, or fish each day. A 3-oz serving of meat is about the same size as a deck of cards. One egg equals 1 oz. ? 2 servings of low-fat dairy each day. ? A serving of nuts, seeds, or beans 5 times each week. ? Heart-healthy fats. Healthy fats called Omega-3 fatty acids are found in foods such as flaxseeds and coldwater fish, like sardines, salmon, and mackerel.  Limit how much you eat of the following: ? Canned or prepackaged foods. ? Food that is high in trans fat, such as fried foods. ? Food that is high in saturated fat, such as fatty meat. ? Sweets, desserts, sugary drinks, and other foods with added sugar. ? Full-fat dairy products.  Do not salt foods before eating.  Try to eat at least 2 vegetarian meals each week.  Eat more home-cooked food and less restaurant, buffet, and fast food.  When eating at a restaurant, ask that your food be prepared with less salt or no salt, if possible. What foods are recommended? The items listed may not be a complete list. Talk with your dietitian about what   dietary choices are best for you. Grains Whole-grain or whole-wheat bread. Whole-grain or whole-wheat pasta. Brown rice. Oatmeal. Quinoa. Bulgur. Whole-grain and low-sodium cereals. Pita bread. Low-fat, low-sodium crackers. Whole-wheat flour tortillas. Vegetables Fresh or frozen vegetables (raw, steamed, roasted, or grilled). Low-sodium or reduced-sodium tomato and vegetable juice. Low-sodium or reduced-sodium tomato sauce and tomato paste. Low-sodium or reduced-sodium canned vegetables. Fruits All fresh, dried, or frozen fruit. Canned fruit in natural juice (without  added sugar). Meat and other protein foods Skinless chicken or turkey. Ground chicken or turkey. Pork with fat trimmed off. Fish and seafood. Egg whites. Dried beans, peas, or lentils. Unsalted nuts, nut butters, and seeds. Unsalted canned beans. Lean cuts of beef with fat trimmed off. Low-sodium, lean deli meat. Dairy Low-fat (1%) or fat-free (skim) milk. Fat-free, low-fat, or reduced-fat cheeses. Nonfat, low-sodium ricotta or cottage cheese. Low-fat or nonfat yogurt. Low-fat, low-sodium cheese. Fats and oils Soft margarine without trans fats. Vegetable oil. Low-fat, reduced-fat, or light mayonnaise and salad dressings (reduced-sodium). Canola, safflower, olive, soybean, and sunflower oils. Avocado. Seasoning and other foods Herbs. Spices. Seasoning mixes without salt. Unsalted popcorn and pretzels. Fat-free sweets. What foods are not recommended? The items listed may not be a complete list. Talk with your dietitian about what dietary choices are best for you. Grains Baked goods made with fat, such as croissants, muffins, or some breads. Dry pasta or rice meal packs. Vegetables Creamed or fried vegetables. Vegetables in a cheese sauce. Regular canned vegetables (not low-sodium or reduced-sodium). Regular canned tomato sauce and paste (not low-sodium or reduced-sodium). Regular tomato and vegetable juice (not low-sodium or reduced-sodium). Pickles. Olives. Fruits Canned fruit in a light or heavy syrup. Fried fruit. Fruit in cream or butter sauce. Meat and other protein foods Fatty cuts of meat. Ribs. Fried meat. Bacon. Sausage. Bologna and other processed lunch meats. Salami. Fatback. Hotdogs. Bratwurst. Salted nuts and seeds. Canned beans with added salt. Canned or smoked fish. Whole eggs or egg yolks. Chicken or turkey with skin. Dairy Whole or 2% milk, cream, and half-and-half. Whole or full-fat cream cheese. Whole-fat or sweetened yogurt. Full-fat cheese. Nondairy creamers. Whipped toppings.  Processed cheese and cheese spreads. Fats and oils Butter. Stick margarine. Lard. Shortening. Ghee. Bacon fat. Tropical oils, such as coconut, palm kernel, or palm oil. Seasoning and other foods Salted popcorn and pretzels. Onion salt, garlic salt, seasoned salt, table salt, and sea salt. Worcestershire sauce. Tartar sauce. Barbecue sauce. Teriyaki sauce. Soy sauce, including reduced-sodium. Steak sauce. Canned and packaged gravies. Fish sauce. Oyster sauce. Cocktail sauce. Horseradish that you find on the shelf. Ketchup. Mustard. Meat flavorings and tenderizers. Bouillon cubes. Hot sauce and Tabasco sauce. Premade or packaged marinades. Premade or packaged taco seasonings. Relishes. Regular salad dressings. Where to find more information:  National Heart, Lung, and Blood Institute: www.nhlbi.nih.gov  American Heart Association: www.heart.org Summary  The DASH eating plan is a healthy eating plan that has been shown to reduce high blood pressure (hypertension). It may also reduce your risk for type 2 diabetes, heart disease, and stroke.  With the DASH eating plan, you should limit salt (sodium) intake to 2,300 mg a day. If you have hypertension, you may need to reduce your sodium intake to 1,500 mg a day.  When on the DASH eating plan, aim to eat more fresh fruits and vegetables, whole grains, lean proteins, low-fat dairy, and heart-healthy fats.  Work with your health care provider or diet and nutrition specialist (dietitian) to adjust your eating plan to your individual   calorie needs. This information is not intended to replace advice given to you by your health care provider. Make sure you discuss any questions you have with your health care provider. Document Released: 11/17/2011 Document Revised: 11/21/2016 Document Reviewed: 11/21/2016 Elsevier Interactive Patient Education  2018 Elsevier Inc.  

## 2018-10-03 ENCOUNTER — Other Ambulatory Visit: Payer: Self-pay | Admitting: Family Medicine

## 2018-12-25 ENCOUNTER — Ambulatory Visit (INDEPENDENT_AMBULATORY_CARE_PROVIDER_SITE_OTHER): Payer: BLUE CROSS/BLUE SHIELD | Admitting: Family Medicine

## 2018-12-25 ENCOUNTER — Encounter: Payer: Self-pay | Admitting: Family Medicine

## 2018-12-25 VITALS — BP 138/96 | Temp 97.6°F | Ht 68.0 in | Wt 260.0 lb

## 2018-12-25 DIAGNOSIS — M545 Low back pain, unspecified: Secondary | ICD-10-CM

## 2018-12-25 DIAGNOSIS — Z23 Encounter for immunization: Secondary | ICD-10-CM

## 2018-12-25 LAB — POCT URINALYSIS DIPSTICK
Spec Grav, UA: >=1.03 — AB (ref 1.010–1.025)
pH, UA: 5 (ref 5.0–8.0)

## 2018-12-25 MED ORDER — TAMSULOSIN HCL 0.4 MG PO CAPS: 0.4000 mg | ORAL_CAPSULE | Freq: Every day | ORAL | 0 refills | Status: DC

## 2018-12-25 NOTE — Progress Notes (Signed)
   Subjective:    Patient ID: Jeremy Riley, male    DOB: January 21, 1961, 58 y.o.   MRN: [id number]  Back Pain  This is a recurrent problem. The current episode started today. The pain is present in the lumbar spine (left side).  history of kidney stones. Pain has always been on left side lower back.   Results for orders placed or performed in visit on 12/25/18  POCT urinalysis dipstick  Result Value Ref Range   Color, UA     Clarity, UA     Glucose, UA     Bilirubin, UA     Ketones, UA     Spec Grav, UA >=1.030 (A) 1.010 - 1.025   Blood, UA     pH, UA 5.0 5.0 - 8.0   Protein, UA     Urobilinogen, UA     Nitrite, UA     Leukocytes, UA     Appearance     Odor     This morn, pretty sudden onset   Left fank exactly ike last one kidney stone   Some dysuria   No blood in urine     Review of Systems  Musculoskeletal: Positive for back pain.       Objective:   Physical Exam  Alert active good hydration.  Positive left CVA tenderness.  Positive lateral abdominal tenderness.  Heart regular rate and rhythm lungs clear.  Urinalysis occasional red blood cell      Assessment & Plan:  Impression probable kidney stone.  Plan hydration.  Hydrocodone as needed for pain.  Encourage use ibuprofen.  Started on Flomax.  Symptom care discussed.  Warning signs discussed.

## 2018-12-26 ENCOUNTER — Telehealth: Payer: Self-pay | Admitting: Family Medicine

## 2018-12-26 MED ORDER — HYDROCODONE-ACETAMINOPHEN 5-325 MG PO TABS: ORAL_TABLET | ORAL | 0 refills | Status: DC

## 2018-12-26 NOTE — Telephone Encounter (Signed)
Please advise. Thank you

## 2018-12-26 NOTE — Telephone Encounter (Signed)
Pharmacy did not receive refill for HYDROcodone-acetaminophen (VICODIN) 5-325 mg TABS tablet. Pt checking on status of refill. Advise.    Pharmacy:  [id number] - [place], [place]

## 2018-12-26 NOTE — Telephone Encounter (Signed)
Done, sorry electronic goof up on my part

## 2018-12-26 NOTE — Telephone Encounter (Signed)
Pt contacted and informed that medication has been resent. Pt verbalized understanding.

## 2019-01-17 ENCOUNTER — Other Ambulatory Visit: Payer: Self-pay | Admitting: Family Medicine

## 2019-03-17 ENCOUNTER — Other Ambulatory Visit: Payer: Self-pay | Admitting: Family Medicine

## 2019-03-28 ENCOUNTER — Ambulatory Visit: Payer: BLUE CROSS/BLUE SHIELD | Admitting: Family Medicine

## 2019-04-10 ENCOUNTER — Other Ambulatory Visit: Payer: Self-pay | Admitting: Family Medicine

## 2019-04-12 ENCOUNTER — Other Ambulatory Visit: Payer: Self-pay | Admitting: Family Medicine

## 2019-04-15 ENCOUNTER — Other Ambulatory Visit: Payer: Self-pay | Admitting: Family Medicine

## 2019-04-15 NOTE — Telephone Encounter (Signed)
Previous request on other medicines was processed earlier today Will need a virtual visit may have refill

## 2019-04-15 NOTE — Telephone Encounter (Signed)
Needs virtual visit Then may have refills

## 2019-04-17 NOTE — Telephone Encounter (Signed)
Left message to return call 

## 2019-04-23 ENCOUNTER — Ambulatory Visit (INDEPENDENT_AMBULATORY_CARE_PROVIDER_SITE_OTHER): Payer: BLUE CROSS/BLUE SHIELD | Admitting: Family Medicine

## 2019-04-23 ENCOUNTER — Encounter: Payer: Self-pay | Admitting: Family Medicine

## 2019-04-23 ENCOUNTER — Other Ambulatory Visit: Payer: Self-pay

## 2019-04-23 DIAGNOSIS — I10 Essential (primary) hypertension: Secondary | ICD-10-CM

## 2019-04-23 DIAGNOSIS — Z13220 Encounter for screening for lipoid disorders: Secondary | ICD-10-CM | POA: Diagnosis not present

## 2019-04-23 DIAGNOSIS — Z79899 Other long term (current) drug therapy: Secondary | ICD-10-CM | POA: Diagnosis not present

## 2019-04-23 DIAGNOSIS — Z125 Encounter for screening for malignant neoplasm of prostate: Secondary | ICD-10-CM

## 2019-04-23 MED ORDER — HYDROCHLOROTHIAZIDE 12.5 MG PO TABS: 12.5000 mg | ORAL_TABLET | Freq: Every day | ORAL | 1 refills | Status: DC

## 2019-04-23 MED ORDER — HYDROCODONE-ACETAMINOPHEN 5-325 MG PO TABS: ORAL_TABLET | ORAL | 0 refills | Status: DC

## 2019-04-23 MED ORDER — AMLODIPINE BESYLATE 5 MG PO TABS: 5.0000 mg | ORAL_TABLET | Freq: Every day | ORAL | 1 refills | Status: DC

## 2019-04-23 NOTE — Progress Notes (Signed)
   Subjective:    Patient ID: Jeremy Riley, male    DOB: December 11, 1961, 58 y.o.   MRN: [id number]  Hypertension  This is a chronic problem. Pertinent negatives include no chest pain, headaches or shortness of breath. Treatments tried: amlodipine 5mg , hctz 12.5mg . Compliance problems: takes meds every, could do better with diet and exercise. needs to lose weight.   pt states bp has been running good but does not have his readings with him.  Patient relates he does try to watch diet try to stay active works [employer]  In addition to this patient relates has had some intermittent kidney stones but none recently but does need a refill on his hydrocodone drug registry checked he knows to use this medication at home only when he has a kidney stone Virtual Visit via Video Note  I connected with Jeremy Riley on 04/23/19 at  1:10 PM EDT by a video enabled telemedicine application and verified that I am speaking with the correct person using two identifiers.  Location: Patient: home Provider: office  I discussed the limitations of evaluation and management by telemedicine and the availability of in person appointments. The patient expressed understanding and agreed to proceed.  History of Present Illness:    Observations/Objective:   Assessment and Plan:   Follow Up Instructions:    I discussed the assessment and treatment plan with the patient. The patient was provided an opportunity to ask questions and all were answered. The patient agreed with the plan and demonstrated an understanding of the instructions.   The patient was advised to call back or seek an in-person evaluation if the symptoms worsen or if the condition fails to improve as anticipated.  I provided 15 minutes of non-face-to-face time during this encounter.      Review of Systems  Constitutional: Negative for activity change, fatigue and fever.  HENT: Negative for congestion and rhinorrhea.   Respiratory:  Negative for cough and shortness of breath.   Cardiovascular: Negative for chest pain and leg swelling.  Gastrointestinal: Negative for abdominal pain, diarrhea and nausea.  Genitourinary: Negative for dysuria and hematuria.  Neurological: Negative for weakness and headaches.  Psychiatric/Behavioral: Negative for agitation and behavioral problems.       Objective:   Physical Exam Unable to do physical exam via phone patient looks good no respiratory distress       Assessment & Plan:  HTN decent control continue current measures  Watch diet closely keep weight check  Intermittent kidney stones patient will bring the stones to send for analysis pain medication was sent and patient uses it responsibly

## 2019-05-12 ENCOUNTER — Other Ambulatory Visit: Payer: Self-pay | Admitting: Family Medicine

## 2019-05-21 ENCOUNTER — Observation Stay (HOSPITAL_COMMUNITY)
Admission: EM | Admit: 2019-05-21 | Discharge: 2019-05-22 | Discharge status: Against Medical Advice | Payer: BLUE CROSS/BLUE SHIELD | Attending: Internal Medicine | Admitting: Internal Medicine

## 2019-05-21 ENCOUNTER — Emergency Department (HOSPITAL_COMMUNITY): Payer: BLUE CROSS/BLUE SHIELD

## 2019-05-21 ENCOUNTER — Encounter (HOSPITAL_COMMUNITY): Payer: Self-pay

## 2019-05-21 ENCOUNTER — Other Ambulatory Visit: Payer: Self-pay

## 2019-05-21 DIAGNOSIS — E876 Hypokalemia: Secondary | ICD-10-CM | POA: Diagnosis not present

## 2019-05-21 DIAGNOSIS — Z8249 Family history of ischemic heart disease and other diseases of the circulatory system: Secondary | ICD-10-CM | POA: Diagnosis not present

## 2019-05-21 DIAGNOSIS — I10 Essential (primary) hypertension: Secondary | ICD-10-CM | POA: Diagnosis not present

## 2019-05-21 DIAGNOSIS — E669 Obesity, unspecified: Secondary | ICD-10-CM | POA: Diagnosis not present

## 2019-05-21 DIAGNOSIS — Z1159 Encounter for screening for other viral diseases: Secondary | ICD-10-CM | POA: Insufficient documentation

## 2019-05-21 DIAGNOSIS — I25110 Atherosclerotic heart disease of native coronary artery with unstable angina pectoris: Secondary | ICD-10-CM | POA: Diagnosis not present

## 2019-05-21 DIAGNOSIS — Z6836 Body mass index (BMI) 36.0-36.9, adult: Secondary | ICD-10-CM | POA: Insufficient documentation

## 2019-05-21 DIAGNOSIS — Z87891 Personal history of nicotine dependence: Secondary | ICD-10-CM | POA: Diagnosis not present

## 2019-05-21 DIAGNOSIS — Z20828 Contact with and (suspected) exposure to other viral communicable diseases: Secondary | ICD-10-CM | POA: Diagnosis not present

## 2019-05-21 DIAGNOSIS — I160 Hypertensive urgency: Secondary | ICD-10-CM | POA: Diagnosis not present

## 2019-05-21 DIAGNOSIS — R7303 Prediabetes: Secondary | ICD-10-CM | POA: Insufficient documentation

## 2019-05-21 DIAGNOSIS — I249 Acute ischemic heart disease, unspecified: Secondary | ICD-10-CM | POA: Diagnosis not present

## 2019-05-21 DIAGNOSIS — Z79899 Other long term (current) drug therapy: Secondary | ICD-10-CM | POA: Insufficient documentation

## 2019-05-21 DIAGNOSIS — R079 Chest pain, unspecified: Secondary | ICD-10-CM | POA: Diagnosis not present

## 2019-05-21 DIAGNOSIS — R072 Precordial pain: Secondary | ICD-10-CM

## 2019-05-21 LAB — CBC
HCT: 47.4 % (ref 39.0–52.0)
Hemoglobin: 16.5 g/dL (ref 13.0–17.0)
MCH: 30.3 pg (ref 26.0–34.0)
MCHC: 34.8 g/dL (ref 30.0–36.0)
MCV: 87 fL (ref 80.0–100.0)
Platelets: 224 10*3/uL (ref 150–400)
RBC: 5.45 MIL/uL (ref 4.22–5.81)
RDW: 12.7 % (ref 11.5–15.5)
WBC: 7.2 10*3/uL (ref 4.0–10.5)
nRBC: 0 % (ref 0.0–0.2)

## 2019-05-21 LAB — BASIC METABOLIC PANEL
Anion gap: 10 (ref 5–15)
BUN: 17 mg/dL (ref 6–20)
CO2: 26 mmol/L (ref 22–32)
Calcium: 9.2 mg/dL (ref 8.9–10.3)
Chloride: 103 mmol/L (ref 98–111)
Creatinine, Ser: 1.12 mg/dL (ref 0.61–1.24)
GFR calc Af Amer: >60 mL/min (ref >60)
GFR calc non Af Amer: >60 mL/min (ref >60)
Glucose, Bld: 166 mg/dL — ABNORMAL HIGH (ref 70–99)
Potassium: 3.2 mmol/L — ABNORMAL LOW (ref 3.5–5.1)
Sodium: 139 mmol/L (ref 135–145)

## 2019-05-21 LAB — SARS CORONAVIRUS 2 BY RT PCR (HOSPITAL ORDER, PERFORMED IN ~~LOC~~ HOSPITAL LAB): SARS Coronavirus 2: NEGATIVE

## 2019-05-21 LAB — TROPONIN I: Troponin I: <0.03 ng/mL (ref <0.03)

## 2019-05-21 MED ORDER — POTASSIUM CHLORIDE CRYS ER 10 MEQ PO TBCR
10.0000 meq | EXTENDED_RELEASE_TABLET | Freq: Every day | ORAL | Status: DC
  Filled 2019-05-21: qty 1

## 2019-05-21 MED ORDER — MORPHINE SULFATE (PF) 2 MG/ML IV SOLN: 2.0000 mg | INTRAVENOUS | Status: DC | PRN

## 2019-05-21 MED ORDER — HYDROCHLOROTHIAZIDE 25 MG PO TABS: 12.5000 mg | ORAL_TABLET | Freq: Every day | ORAL | Status: DC

## 2019-05-21 MED ORDER — ENOXAPARIN SODIUM 40 MG/0.4ML ~~LOC~~ SOLN
40.0000 mg | SUBCUTANEOUS | Status: DC
  Administered 2019-05-21: 40 mg via SUBCUTANEOUS
  Filled 2019-05-21: qty 0.4

## 2019-05-21 MED ORDER — SODIUM CHLORIDE 0.9 % IV SOLN: 250.0000 mL | INTRAVENOUS | Status: DC | PRN

## 2019-05-21 MED ORDER — SODIUM CHLORIDE 0.9% FLUSH: 3.0000 mL | Freq: Two times a day (BID) | INTRAVENOUS | Status: DC

## 2019-05-21 MED ORDER — METOPROLOL TARTRATE 25 MG PO TABS
25.0000 mg | ORAL_TABLET | Freq: Two times a day (BID) | ORAL | Status: DC
  Administered 2019-05-21: 25 mg via ORAL
  Filled 2019-05-21: qty 1

## 2019-05-21 MED ORDER — ACETAMINOPHEN 325 MG PO TABS: 650.0000 mg | ORAL_TABLET | ORAL | Status: DC | PRN

## 2019-05-21 MED ORDER — AMLODIPINE BESYLATE 5 MG PO TABS: 5.0000 mg | ORAL_TABLET | Freq: Every day | ORAL | Status: DC

## 2019-05-21 MED ORDER — NITROGLYCERIN 0.4 MG SL SUBL: 0.4000 mg | SUBLINGUAL_TABLET | SUBLINGUAL | Status: DC | PRN

## 2019-05-21 MED ORDER — POTASSIUM CHLORIDE CRYS ER 20 MEQ PO TBCR
20.0000 meq | EXTENDED_RELEASE_TABLET | Freq: Once | ORAL | Status: AC
  Administered 2019-05-21: 20 meq via ORAL
  Filled 2019-05-21: qty 1

## 2019-05-21 MED ORDER — ASPIRIN 81 MG PO CHEW
324.0000 mg | CHEWABLE_TABLET | Freq: Once | ORAL | Status: AC
  Administered 2019-05-21: 324 mg via ORAL
  Filled 2019-05-21: qty 4

## 2019-05-21 MED ORDER — ONDANSETRON HCL 4 MG/2ML IJ SOLN: 4.0000 mg | Freq: Four times a day (QID) | INTRAMUSCULAR | Status: DC | PRN

## 2019-05-21 MED ORDER — SODIUM CHLORIDE 0.9% FLUSH: 3.0000 mL | INTRAVENOUS | Status: DC | PRN

## 2019-05-21 MED ORDER — ASPIRIN EC 81 MG PO TBEC: 81.0000 mg | DELAYED_RELEASE_TABLET | Freq: Every day | ORAL | Status: DC

## 2019-05-21 MED ORDER — SODIUM CHLORIDE 0.9% FLUSH: 3.0000 mL | Freq: Once | INTRAVENOUS | Status: DC

## 2019-05-21 NOTE — H&P (Signed)
History and Physical    Jeremy Riley DDU:[id number] DOB: 1961-08-21 DOA: 05/21/2019  PCP: Kathyrn Drown, MD   Patient coming from: Home   Chief Complaint: Chest pain   HPI: Jeremy Riley is a 58 y.o. male with medical history significant for hypertension and obesity (BMI 36), now presenting to the emergency department for evaluation of chest discomfort.  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  He decided to lay down, his wife could see that he was not feeling well, recommended that he check his blood pressure, noted his pressure to be elevated, and came into the ED for evaluation.  Denies any associated shortness of breath, cough, fevers, or chills, and denies sick contacts.  ED Course: Upon arrival to the ED, patient is found to be afebrile, saturating well on room air, and hypertensive to 190/110.  EKG features a sinus rhythm with PVC and non-specific ST-abnormalities, and chest x-ray is notable for minimal right middle lobe atelectasis only.  Chemistry panel features a potassium of 3.2 and CBC is unremarkable.  Initial troponin is undetectable.  Patient was given 324 mg of aspirin and hospitalists were asked to admit.  Review of Systems:  All other systems reviewed and apart from HPI, are negative.  Past Medical History:  Diagnosis Date  . Hypertension     History reviewed. No pertinent surgical history.   reports that he has quit smoking. His smokeless tobacco use includes chew. He reports current alcohol use. He reports previous drug use.  No Known Allergies  Family History  Problem Relation Age of Onset  . Heart disease Father      Prior to Admission medications   Medication Sig Start Date End Date Taking? Authorizing Provider  acetaminophen  (TYLENOL) 500 MG tablet Take 500 mg by mouth every 6 (six) hours as needed.    [provider]  amLODipine (NORVASC) 5 MG tablet Take 1 tablet (5 mg total) by mouth daily. 04/23/19   Kathyrn Drown, MD  hydrochlorothiazide (HYDRODIURIL) 12.5 MG tablet Take 1 tablet (12.5 mg total) by mouth daily. 04/23/19   Kathyrn Drown, MD  HYDROcodone-acetaminophen (NORCO/VICODIN) 5-325 MG tablet Take 1 tablet by mouth every 6 (six) hours as needed for moderate pain.    [provider]  HYDROcodone-acetaminophen (NORCO/VICODIN) 5-325 MG tablet Take one every 4  hours prn pain 04/23/19   Kathyrn Drown, MD  HYDROcodone-acetaminophen (VICODIN) 5-325 mg TABS tablet One po Q 6 hours prn pain Patient not taking: Reported on 12/25/2018 02/27/18   Mikey Kirschner, MD  ibuprofen (ADVIL,MOTRIN) 200 MG tablet Take 200 mg by mouth every 6 (six) hours as needed.    [provider]  ondansetron (ZOFRAN ODT) 4 MG disintegrating tablet One po Q 8 hours prn nausea Patient not taking: Reported on 04/23/2019 02/27/18   Mikey Kirschner, MD  potassium chloride (K-DUR) 10 MEQ tablet TAKE 1 TABLET BY MOUTH EVERY DAY 05/13/19   Kathyrn Drown, MD  tamsulosin (FLOMAX) 0.4 MG CAPS capsule TAKE 1 CAPSULE BY MOUTH EVERYDAY AT BEDTIME Patient not taking: Reported on 04/23/2019 04/10/19   Kathyrn Drown, MD    Physical Exam: Vitals:   05/21/19 2008 05/21/19 2009 05/21/19 2200 05/21/19 2230  BP: (!) 189/114  Marland Kitchen)  171/100 (!) 155/93  Pulse: 96  93 88  Resp: 18  (!) 21 (!) 21  Temp: 98.2 F (36.8 C)     TempSrc: Oral     SpO2: 98%  98% 100%  Weight:  108.9 kg    Height:  5\' 8"  (1.727 m)      Constitutional: NAD, calm  Eyes: PERTLA, lids and conjunctivae normal ENMT: Mucous membranes are moist. Posterior pharynx clear of any exudate or lesions.   Neck: normal, supple, no masses, no thyromegaly Respiratory: clear to auscultation bilaterally, no wheezing, no crackles. No accessory muscle use.   Cardiovascular: S1 & S2 heard, regular rate and rhythm. No extremity edema.   Abdomen: No distension, no tenderness, soft. Bowel sounds normal.  Musculoskeletal: no clubbing / cyanosis. No joint deformity upper and lower extremities.   Skin: no significant rashes, lesions, ulcers. Warm, dry, well-perfused. Neurologic: CN 2-12 grossly intact. Sensation intact. Strength 5/5 in all 4 limbs.  Psychiatric: Alert and oriented x 3. Very pleasant, cooperative.    Labs on Admission: I have personally reviewed following labs and imaging studies  CBC: Recent Labs  Lab 05/21/19 2119  WBC 7.2  HGB 16.5  HCT 47.4  MCV 87.0  PLT 854   Basic Metabolic Panel: Recent Labs  Lab 05/21/19 2119  NA 139  K 3.2*  CL 103  CO2 26  GLUCOSE 166*  BUN 17  CREATININE 1.12  CALCIUM 9.2   GFR: Estimated Creatinine Clearance: 86 mL/min (by C-G formula based on SCr of 1.12 mg/dL). Liver Function Tests: No results for input(s): AST, ALT, ALKPHOS, BILITOT, PROT, ALBUMIN in the last 168 hours. No results for input(s): LIPASE, AMYLASE in the last 168 hours. No results for input(s): AMMONIA in the last 168 hours. Coagulation Profile: No results for input(s): INR, PROTIME in the last 168 hours. Cardiac Enzymes: Recent Labs  Lab 05/21/19 2119  TROPONINI <0.03   BNP (last 3 results) No results for input(s): PROBNP in the last 8760 hours. HbA1C: No results for input(s): HGBA1C in the last 72 hours. CBG: No results for input(s): GLUCAP in the last 168 hours. Lipid Profile: No results for input(s): CHOL, HDL, LDLCALC, TRIG, CHOLHDL, LDLDIRECT in the last 72 hours. Thyroid Function Tests: No results for input(s): TSH, T4TOTAL, FREET4, T3FREE, THYROIDAB in the last 72 hours. Anemia Panel: No results for input(s): VITAMINB12, FOLATE, FERRITIN, TIBC, IRON, RETICCTPCT in the last 72 hours. Urine analysis:    Component Value Date/Time   COLORURINE YELLOW 05/21/2008 0638   APPEARANCEUR CLEAR 05/21/2008  0638   LABSPEC >1.030 (H) 05/21/2008 0638   PHURINE 5.0 05/21/2008 0638   GLUCOSEU NEGATIVE 05/21/2008 0638   HGBUR LARGE (A) 05/21/2008 0638   BILIRUBINUR NEGATIVE 05/21/2008 0638   KETONESUR NEGATIVE 05/21/2008 0638   PROTEINUR NEGATIVE 05/21/2008 0638   UROBILINOGEN 0.2 05/21/2008 0638   NITRITE NEGATIVE 05/21/2008 0638   LEUKOCYTESUR Negative 02/27/2018 1622   Sepsis Labs: @LABRCNTIP (procalcitonin:4,lacticidven:4) )No results found for this or any previous visit (from the past 240 hour(s)).   Radiological Exams on Admission: Dg Chest 2 View  Result Date: 05/21/2019 CLINICAL DATA:  Chest pain. EXAM: CHEST - 2 VIEW COMPARISON:  Radiographs of September 10, 2007. FINDINGS: The heart size and mediastinal contours are within normal limits. No pneumothorax or pleural effusion is noted. Left lung is clear. Minimal right middle lobe subsegmental atelectasis is noted. The visualized skeletal structures are unremarkable. IMPRESSION: Minimal right middle lobe subsegmental atelectasis. Electronically Signed   By: Sabino Dick  Jr M.D.   On: 05/21/2019 20:50    EKG: Independently reviewed. Sinus rhythm, PVC, diffuse ST-abnormalities similar to prior.   Assessment/Plan   1. Chest pain  - Presents with 2 days of waxing and waning chest pressure, seemingly unaffected by exertion  - Initial troponin is <0.03, EKG with ST-abnormalities that appear similar to prior, and CXR unremearkable  - Continue cardiac monitoring, obtain serial troponin measurements, repeat EKG, start low-dose metoprolol for now, check fasting lipids in am    2. Hypertension with hypertensive urgency  - BP 190/110 in ED, patient reports compliance with his medications  - Continue Norvasc and HCTZ, start low-dose metoprolol for now    3. Hypokalemia  - Serum potassium is 3.2 on admission and replaced with oral potassium  - Repeat chem panel in am     PPE: Mask, face shield. Patient wearing mask.  DVT prophylaxis: Lovenox   Code Status: Full  Family Communication: Discussed with patient  Consults called: None Admission status: observation     Vianne Bulls, MD [facility] Pager [phone]  If 7PM-7AM, please contact night-coverage [email or web address] Password [email or web address]  05/21/2019, 11:03 PM

## 2019-05-21 NOTE — ED Triage Notes (Signed)
Pt presents to ED with complaints of chest tightness which started tonight. Pt states he has also had some nausea.

## 2019-05-21 NOTE — ED Provider Notes (Signed)
Emergency Department Provider Note   I have reviewed the triage vital signs and the nursing notes.   HISTORY  Chief Complaint Chest Pain   HPI Jeremy Riley is a 58 y.o. male with PMH of HTN, elevated BMI, and smoking history presents to the emergency department for evaluation of intermittent chest tightness which began this evening.  Patient states he had some mild symptoms yesterday but resolved quickly.  Today, the patient had central chest heaviness and tightness.  No radiation of symptoms.  Not worse with exertion or deep breathing.  No fevers or chills.  He did feel some mild nausea but no vomiting.  He started to feel worse at home and alerted his wife.  He states that she checked his blood pressure and saw it was very high and insisted that he present to the emergency department.  He has no prior history of CAD.  He has been compliant with his home medications.  He states that his father did have a heart attack but unsure exactly which age.  He stopped smoking several years ago but does vape tobacco currently. CP resolved shortly after arrival in the ED.    Past Medical History:  Diagnosis Date   Hypertension     Patient Active Problem List   Diagnosis Date Noted   Chest pain 05/21/2019   Hypertensive urgency 05/21/2019   Hypokalemia 05/21/2019   HTN (hypertension) 10/03/2017    History reviewed. No pertinent surgical history.  Allergies Patient has no known allergies.  History reviewed. No pertinent family history.  Social History Social History   Tobacco Use   Smoking status: Former Smoker   Smokeless tobacco: Current User    Types: Chew  Substance Use Topics   Alcohol use: Yes    Comment: occ   Drug use: Not Currently    Review of Systems  Constitutional: No fever/chills Eyes: No visual changes. ENT: No sore throat. Cardiovascular: Positive chest pain. Respiratory: Denies shortness of breath. Gastrointestinal: No abdominal pain.  No  nausea, no vomiting.  No diarrhea.  No constipation. Genitourinary: Negative for dysuria. Musculoskeletal: Negative for back pain. Skin: Negative for rash. Neurological: Negative for headaches, focal weakness or numbness.  10-point ROS otherwise negative.  ____________________________________________   PHYSICAL EXAM:  VITAL SIGNS: ED Triage Vitals  Enc Vitals Group     BP 05/21/19 2008 (!) 189/114     Pulse Rate 05/21/19 2008 96     Resp 05/21/19 2008 18     Temp 05/21/19 2008 98.2 F (36.8 C)     Temp Source 05/21/19 2008 Oral     SpO2 05/21/19 2008 98 %     Weight 05/21/19 2009 240 lb (108.9 kg)     Height 05/21/19 2009 5\' 8"  (1.727 m)   Constitutional: Alert and oriented. Well appearing and in no acute distress. Eyes: Conjunctivae are normal. Head: Atraumatic. Nose: No congestion/rhinnorhea. Mouth/Throat: Mucous membranes are moist.  Neck: No stridor.   Cardiovascular: Normal rate, regular rhythm. Good peripheral circulation. Grossly normal heart sounds.   Respiratory: Normal respiratory effort.  No retractions. Lungs CTAB. Gastrointestinal: Soft and nontender. No distention.  Musculoskeletal: No lower extremity tenderness nor edema. No gross deformities of extremities. Neurologic:  Normal speech and language. No gross focal neurologic deficits are appreciated.  Skin:  Skin is warm, dry and intact. No rash noted.  ____________________________________________   LABS (all labs ordered are listed, but only abnormal results are displayed)  Labs Reviewed  BASIC METABOLIC PANEL - Abnormal;  Notable for the following components:      Result Value   Potassium 3.2 (*)    Glucose, Bld 166 (*)    All other components within normal limits  SARS CORONAVIRUS 2 (HOSPITAL ORDER, [facility])  CBC  TROPONIN I   ____________________________________________  EKG     ____________________________________________  RADIOLOGY  Dg Chest 2  View  Result Date: 05/21/2019 CLINICAL DATA:  Chest pain. EXAM: CHEST - 2 VIEW COMPARISON:  Radiographs of September 10, 2007. FINDINGS: The heart size and mediastinal contours are within normal limits. No pneumothorax or pleural effusion is noted. Left lung is clear. Minimal right middle lobe subsegmental atelectasis is noted. The visualized skeletal structures are unremarkable. IMPRESSION: Minimal right middle lobe subsegmental atelectasis. Electronically Signed   By: Marijo Conception M.D.   On: 05/21/2019 20:50    ____________________________________________   PROCEDURES  Procedure(s) performed:   Procedures  None  ____________________________________________   INITIAL IMPRESSION / ASSESSMENT AND PLAN / ED COURSE  Pertinent labs & imaging results that were available during my care of the patient were reviewed by me and considered in my medical decision making (see chart for details).   Patient presents to the ED for evaluation of chest pain. HEART score 5. Low PE suspicion. EKG not crossing into Epic but picture above. Non-specific ST changes. No STEMI.   Discussed patient's case with Hospitalist, Dr. Myna Hidalgo to request admission. Patient and family (if present) updated with plan. Care transferred to Hospitalist service.  I reviewed all nursing notes, vitals, pertinent old records, EKGs, labs, imaging (as available).  ____________________________________________  FINAL CLINICAL IMPRESSION(S) / ED DIAGNOSES  Final diagnoses:  Precordial chest pain    MEDICATIONS GIVEN DURING THIS VISIT:  Medications  sodium chloride flush (NS) 0.9 % injection 3 mL (has no administration in time range)  aspirin chewable tablet 324 mg (324 mg Oral Given 05/21/19 2207)     Note:  This document was prepared using Dragon voice recognition software and may include unintentional dictation errors.  Nanda Quinton, MD Emergency Medicine    Mischelle Reeg, [facility], MD 05/21/19 2255

## 2019-05-22 ENCOUNTER — Other Ambulatory Visit: Payer: Self-pay

## 2019-05-22 ENCOUNTER — Encounter (HOSPITAL_COMMUNITY)
Admission: EM | Disposition: A | Discharge status: Home / Self Care | Payer: Self-pay | Source: Home / Self Care | Attending: Emergency Medicine

## 2019-05-22 ENCOUNTER — Encounter (HOSPITAL_COMMUNITY): Payer: Self-pay | Admitting: *Deleted

## 2019-05-22 ENCOUNTER — Telehealth: Payer: Self-pay | Admitting: *Deleted

## 2019-05-22 ENCOUNTER — Observation Stay (HOSPITAL_BASED_OUTPATIENT_CLINIC_OR_DEPARTMENT_OTHER)
Admission: EM | Admit: 2019-05-22 | Discharge: 2019-05-23 | Disposition: A | Discharge status: Home / Self Care | Payer: BLUE CROSS/BLUE SHIELD | Source: Home / Self Care | Attending: Emergency Medicine | Admitting: Emergency Medicine

## 2019-05-22 DIAGNOSIS — Z87891 Personal history of nicotine dependence: Secondary | ICD-10-CM | POA: Diagnosis not present

## 2019-05-22 DIAGNOSIS — E785 Hyperlipidemia, unspecified: Secondary | ICD-10-CM | POA: Insufficient documentation

## 2019-05-22 DIAGNOSIS — Z9861 Coronary angioplasty status: Secondary | ICD-10-CM | POA: Diagnosis present

## 2019-05-22 DIAGNOSIS — R7303 Prediabetes: Secondary | ICD-10-CM | POA: Diagnosis present

## 2019-05-22 DIAGNOSIS — E782 Mixed hyperlipidemia: Secondary | ICD-10-CM | POA: Diagnosis not present

## 2019-05-22 DIAGNOSIS — R079 Chest pain, unspecified: Secondary | ICD-10-CM | POA: Diagnosis not present

## 2019-05-22 DIAGNOSIS — E669 Obesity, unspecified: Secondary | ICD-10-CM | POA: Diagnosis not present

## 2019-05-22 DIAGNOSIS — Z6836 Body mass index (BMI) 36.0-36.9, adult: Secondary | ICD-10-CM | POA: Diagnosis not present

## 2019-05-22 DIAGNOSIS — I25110 Atherosclerotic heart disease of native coronary artery with unstable angina pectoris: Secondary | ICD-10-CM | POA: Diagnosis not present

## 2019-05-22 DIAGNOSIS — Z955 Presence of coronary angioplasty implant and graft: Secondary | ICD-10-CM

## 2019-05-22 DIAGNOSIS — I160 Hypertensive urgency: Secondary | ICD-10-CM | POA: Diagnosis not present

## 2019-05-22 DIAGNOSIS — I249 Acute ischemic heart disease, unspecified: Secondary | ICD-10-CM | POA: Diagnosis not present

## 2019-05-22 DIAGNOSIS — Z8249 Family history of ischemic heart disease and other diseases of the circulatory system: Secondary | ICD-10-CM | POA: Diagnosis not present

## 2019-05-22 DIAGNOSIS — I200 Unstable angina: Secondary | ICD-10-CM | POA: Diagnosis not present

## 2019-05-22 DIAGNOSIS — I10 Essential (primary) hypertension: Secondary | ICD-10-CM | POA: Diagnosis present

## 2019-05-22 DIAGNOSIS — Z79899 Other long term (current) drug therapy: Secondary | ICD-10-CM | POA: Diagnosis not present

## 2019-05-22 DIAGNOSIS — E876 Hypokalemia: Secondary | ICD-10-CM | POA: Diagnosis present

## 2019-05-22 DIAGNOSIS — Z1159 Encounter for screening for other viral diseases: Secondary | ICD-10-CM | POA: Diagnosis not present

## 2019-05-22 DIAGNOSIS — I2510 Atherosclerotic heart disease of native coronary artery without angina pectoris: Secondary | ICD-10-CM | POA: Diagnosis present

## 2019-05-22 DIAGNOSIS — R072 Precordial pain: Secondary | ICD-10-CM | POA: Diagnosis present

## 2019-05-22 HISTORY — DX: Prediabetes: R73.03

## 2019-05-22 HISTORY — DX: Atherosclerotic heart disease of native coronary artery without angina pectoris: I25.10

## 2019-05-22 HISTORY — DX: Personal history of pneumonia (recurrent): Z87.01

## 2019-05-22 HISTORY — DX: Unspecified thoracic, thoracolumbar and lumbosacral intervertebral disc disorder: M51.9

## 2019-05-22 HISTORY — PX: CORONARY STENT INTERVENTION: CATH118234

## 2019-05-22 HISTORY — PX: LEFT HEART CATH AND CORONARY ANGIOGRAPHY: CATH118249

## 2019-05-22 HISTORY — DX: Hyperlipidemia, unspecified: E78.5

## 2019-05-22 LAB — BASIC METABOLIC PANEL
Anion gap: 12 (ref 5–15)
BUN: 14 mg/dL (ref 6–20)
CO2: 25 mmol/L (ref 22–32)
Calcium: 9.1 mg/dL (ref 8.9–10.3)
Chloride: 103 mmol/L (ref 98–111)
Creatinine, Ser: 1.05 mg/dL (ref 0.61–1.24)
GFR calc Af Amer: >60 mL/min (ref >60)
GFR calc non Af Amer: >60 mL/min (ref >60)
Glucose, Bld: 133 mg/dL — ABNORMAL HIGH (ref 70–99)
Potassium: 4.3 mmol/L (ref 3.5–5.1)
Sodium: 140 mmol/L (ref 135–145)

## 2019-05-22 LAB — POCT ACTIVATED CLOTTING TIME: Activated Clotting Time: 290 seconds

## 2019-05-22 LAB — CREATININE, SERUM
Creatinine, Ser: 1.04 mg/dL (ref 0.61–1.24)
GFR calc Af Amer: >60 mL/min (ref >60)
GFR calc non Af Amer: >60 mL/min (ref >60)

## 2019-05-22 LAB — LIPID PANEL
Cholesterol: 220 mg/dL — ABNORMAL HIGH (ref 0–200)
HDL: 37 mg/dL — ABNORMAL LOW (ref >40)
LDL Cholesterol: 134 mg/dL — ABNORMAL HIGH (ref 0–99)
Total CHOL/HDL Ratio: 5.9 RATIO
Triglycerides: 245 mg/dL — ABNORMAL HIGH (ref <150)
VLDL: 49 mg/dL — ABNORMAL HIGH (ref 0–40)

## 2019-05-22 LAB — TROPONIN I
Troponin I: <0.03 ng/mL (ref <0.03)
Troponin I: <0.03 ng/mL (ref <0.03)

## 2019-05-22 LAB — CBC
HCT: 46.1 % (ref 39.0–52.0)
Hemoglobin: 16.5 g/dL (ref 13.0–17.0)
MCH: 30.7 pg (ref 26.0–34.0)
MCHC: 35.8 g/dL (ref 30.0–36.0)
MCV: 85.7 fL (ref 80.0–100.0)
Platelets: 275 10*3/uL (ref 150–400)
RBC: 5.38 MIL/uL (ref 4.22–5.81)
RDW: 12.6 % (ref 11.5–15.5)
WBC: 10.4 10*3/uL (ref 4.0–10.5)
nRBC: 0 % (ref 0.0–0.2)

## 2019-05-22 LAB — MAGNESIUM: Magnesium: 2.3 mg/dL (ref 1.7–2.4)

## 2019-05-22 SURGERY — LEFT HEART CATH AND CORONARY ANGIOGRAPHY
Anesthesia: LOCAL

## 2019-05-22 MED ORDER — SODIUM CHLORIDE 0.9% FLUSH: 3.0000 mL | INTRAVENOUS | Status: DC | PRN

## 2019-05-22 MED ORDER — VERAPAMIL HCL 2.5 MG/ML IV SOLN
INTRAVENOUS | Status: DC | PRN
  Administered 2019-05-22: 10 mL via INTRA_ARTERIAL

## 2019-05-22 MED ORDER — HEPARIN SODIUM (PORCINE) 1000 UNIT/ML IJ SOLN
INTRAMUSCULAR | Status: AC
  Filled 2019-05-22: qty 1

## 2019-05-22 MED ORDER — VERAPAMIL HCL 2.5 MG/ML IV SOLN
INTRAVENOUS | Status: AC
  Filled 2019-05-22: qty 2

## 2019-05-22 MED ORDER — ATORVASTATIN CALCIUM 40 MG PO TABS: 40.0000 mg | ORAL_TABLET | Freq: Every day | ORAL | Status: DC

## 2019-05-22 MED ORDER — HYDROCODONE-ACETAMINOPHEN 5-325 MG PO TABS: 1.0000 | ORAL_TABLET | Freq: Four times a day (QID) | ORAL | Status: DC | PRN

## 2019-05-22 MED ORDER — HEPARIN (PORCINE) IN NACL 1000-0.9 UT/500ML-% IV SOLN
INTRAVENOUS | Status: AC
  Filled 2019-05-22: qty 1000

## 2019-05-22 MED ORDER — LIDOCAINE HCL (PF) 1 % IJ SOLN
INTRAMUSCULAR | Status: AC
  Filled 2019-05-22: qty 30

## 2019-05-22 MED ORDER — ONDANSETRON HCL 4 MG/2ML IJ SOLN: 4.0000 mg | Freq: Four times a day (QID) | INTRAMUSCULAR | Status: DC | PRN

## 2019-05-22 MED ORDER — ASPIRIN EC 81 MG PO TBEC: 81.0000 mg | DELAYED_RELEASE_TABLET | Freq: Every day | ORAL | Status: DC

## 2019-05-22 MED ORDER — FENTANYL CITRATE (PF) 100 MCG/2ML IJ SOLN
INTRAMUSCULAR | Status: AC
  Filled 2019-05-22: qty 2

## 2019-05-22 MED ORDER — TICAGRELOR 90 MG PO TABS
ORAL_TABLET | ORAL | Status: AC
  Filled 2019-05-22: qty 1

## 2019-05-22 MED ORDER — SODIUM CHLORIDE 0.9 % IV SOLN: 250.0000 mL | INTRAVENOUS | Status: DC | PRN

## 2019-05-22 MED ORDER — NITROGLYCERIN 1 MG/10 ML FOR IR/CATH LAB
INTRA_ARTERIAL | Status: AC
  Filled 2019-05-22: qty 10

## 2019-05-22 MED ORDER — LABETALOL HCL 5 MG/ML IV SOLN: 10.0000 mg | INTRAVENOUS | Status: AC | PRN

## 2019-05-22 MED ORDER — FENTANYL CITRATE (PF) 100 MCG/2ML IJ SOLN
INTRAMUSCULAR | Status: DC | PRN
  Administered 2019-05-22 (×2): 25 ug via INTRAVENOUS

## 2019-05-22 MED ORDER — SODIUM CHLORIDE 0.9% FLUSH
3.0000 mL | Freq: Two times a day (BID) | INTRAVENOUS | Status: DC
  Administered 2019-05-22: 3 mL via INTRAVENOUS

## 2019-05-22 MED ORDER — HEPARIN SODIUM (PORCINE) 1000 UNIT/ML IJ SOLN
INTRAMUSCULAR | Status: DC | PRN
  Administered 2019-05-22: 7000 [IU] via INTRAVENOUS
  Administered 2019-05-22: 5000 [IU] via INTRAVENOUS
  Administered 2019-05-22: 2000 [IU] via INTRAVENOUS

## 2019-05-22 MED ORDER — NITROGLYCERIN 0.4 MG SL SUBL: 0.4000 mg | SUBLINGUAL_TABLET | SUBLINGUAL | Status: DC | PRN

## 2019-05-22 MED ORDER — IOHEXOL 350 MG/ML SOLN
INTRAVENOUS | Status: DC | PRN
  Administered 2019-05-22: 19:00:00 120 mL via INTRAVENOUS

## 2019-05-22 MED ORDER — HYDRALAZINE HCL 20 MG/ML IJ SOLN: 10.0000 mg | INTRAMUSCULAR | Status: AC | PRN

## 2019-05-22 MED ORDER — ENOXAPARIN SODIUM 40 MG/0.4ML ~~LOC~~ SOLN: 40.0000 mg | SUBCUTANEOUS | Status: DC

## 2019-05-22 MED ORDER — NITROGLYCERIN 1 MG/10 ML FOR IR/CATH LAB
INTRA_ARTERIAL | Status: DC | PRN
  Administered 2019-05-22: 200 ug via INTRACORONARY

## 2019-05-22 MED ORDER — SODIUM CHLORIDE 0.9% FLUSH
3.0000 mL | Freq: Two times a day (BID) | INTRAVENOUS | Status: DC
  Administered 2019-05-23: 3 mL via INTRAVENOUS

## 2019-05-22 MED ORDER — ATORVASTATIN CALCIUM 80 MG PO TABS: 80.0000 mg | ORAL_TABLET | Freq: Every day | ORAL | Status: DC

## 2019-05-22 MED ORDER — AMLODIPINE BESYLATE 5 MG PO TABS
5.0000 mg | ORAL_TABLET | Freq: Every day | ORAL | Status: DC
  Administered 2019-05-23: 06:00:00 5 mg via ORAL
  Filled 2019-05-22: qty 1

## 2019-05-22 MED ORDER — ACETAMINOPHEN 325 MG PO TABS: 650.0000 mg | ORAL_TABLET | ORAL | Status: DC | PRN

## 2019-05-22 MED ORDER — TICAGRELOR 90 MG PO TABS
90.0000 mg | ORAL_TABLET | Freq: Two times a day (BID) | ORAL | Status: DC
  Administered 2019-05-23: 90 mg via ORAL
  Filled 2019-05-22: qty 1

## 2019-05-22 MED ORDER — OXYCODONE HCL 5 MG PO TABS: 5.0000 mg | ORAL_TABLET | ORAL | Status: DC | PRN

## 2019-05-22 MED ORDER — SODIUM CHLORIDE 0.9 % IV SOLN
INTRAVENOUS | Status: AC
  Administered 2019-05-22: 19:00:00 via INTRAVENOUS

## 2019-05-22 MED ORDER — LIDOCAINE HCL (PF) 1 % IJ SOLN
INTRAMUSCULAR | Status: DC | PRN
  Administered 2019-05-22: 10 mL

## 2019-05-22 MED ORDER — MIDAZOLAM HCL 2 MG/2ML IJ SOLN
INTRAMUSCULAR | Status: DC | PRN
  Administered 2019-05-22: 0.5 mg via INTRAVENOUS
  Administered 2019-05-22: 1 mg via INTRAVENOUS

## 2019-05-22 MED ORDER — SODIUM CHLORIDE 0.9 % IV SOLN
INTRAVENOUS | Status: DC
  Administered 2019-05-22 – 2019-05-23 (×2): via INTRAVENOUS

## 2019-05-22 MED ORDER — MIDAZOLAM HCL 2 MG/2ML IJ SOLN
INTRAMUSCULAR | Status: AC
  Filled 2019-05-22: qty 2

## 2019-05-22 MED ORDER — TICAGRELOR 90 MG PO TABS
ORAL_TABLET | ORAL | Status: DC | PRN
  Administered 2019-05-22: 180 mg via ORAL

## 2019-05-22 MED ORDER — HEPARIN SODIUM (PORCINE) 5000 UNIT/ML IJ SOLN
5000.0000 [IU] | Freq: Three times a day (TID) | INTRAMUSCULAR | Status: DC
  Administered 2019-05-22: 5000 [IU] via SUBCUTANEOUS
  Filled 2019-05-22: qty 1

## 2019-05-22 MED ORDER — ASPIRIN 81 MG PO CHEW
81.0000 mg | CHEWABLE_TABLET | Freq: Every day | ORAL | Status: DC
  Administered 2019-05-23: 81 mg via ORAL
  Filled 2019-05-22: qty 1

## 2019-05-22 MED ORDER — ASPIRIN 81 MG PO CHEW
81.0000 mg | CHEWABLE_TABLET | ORAL | Status: AC
  Administered 2019-05-22: 15:00:00 81 mg via ORAL
  Filled 2019-05-22: qty 1

## 2019-05-22 MED ORDER — HEPARIN (PORCINE) IN NACL 1000-0.9 UT/500ML-% IV SOLN
INTRAVENOUS | Status: DC | PRN
  Administered 2019-05-22 (×2): 500 mL

## 2019-05-22 MED ORDER — HYDROCHLOROTHIAZIDE 25 MG PO TABS
12.5000 mg | ORAL_TABLET | Freq: Every day | ORAL | Status: DC
  Administered 2019-05-23: 12.5 mg via ORAL
  Filled 2019-05-22: qty 1

## 2019-05-22 SURGICAL SUPPLY — 16 items
BALLN SAPPHIRE 2.5X15 (BALLOONS) ×2
BALLN SAPPHIRE ~~LOC~~ 2.75X12 (BALLOONS) ×2 IMPLANT
BALLOON SAPPHIRE 2.5X15 (BALLOONS) ×1 IMPLANT
CATH 5FR JL3.5 JR4 ANG PIG MP (CATHETERS) ×2 IMPLANT
CATH VISTA GUIDE 6FR XB3.5 (CATHETERS) ×2 IMPLANT
DEVICE RAD COMP TR BAND LRG (VASCULAR PRODUCTS) ×2 IMPLANT
GLIDESHEATH SLEND A-KIT 6F 22G (SHEATH) ×2 IMPLANT
GUIDEWIRE INQWIRE 1.5J.035X260 (WIRE) ×1 IMPLANT
INQWIRE 1.5J .035X260CM (WIRE) ×2
KIT ENCORE 26 ADVANTAGE (KITS) ×2 IMPLANT
KIT HEART LEFT (KITS) ×2 IMPLANT
PACK CARDIAC CATHETERIZATION (CUSTOM PROCEDURE TRAY) ×2 IMPLANT
STENT RESOLUTE ONYX 2.5X18 (Permanent Stent) ×2 IMPLANT
TRANSDUCER W/STOPCOCK (MISCELLANEOUS) ×2 IMPLANT
TUBING CIL FLEX 10 FLL-RA (TUBING) ×2 IMPLANT
WIRE ASAHI PROWATER 180CM (WIRE) ×2 IMPLANT

## 2019-05-22 NOTE — ED Triage Notes (Signed)
Pt left earlier this morning from ED AMA. Pt was holding in the ED for inpatient bed. Pt had come in last night with c/o chest tightness that started last night. Pt denies any chest pain currently but does report that he feels like he has some "chest congestion". Pt denies SOB, cough.

## 2019-05-22 NOTE — Interval H&P Note (Signed)
Cath Lab Visit (complete for each Cath Lab visit)  Clinical Evaluation Leading to the Procedure:   ACS: Yes.    Non-ACS:    Anginal Classification: CCS III  Anti-ischemic medical therapy: Minimal Therapy (1 class of medications)  Non-Invasive Test Results: No non-invasive testing performed  Prior CABG: No previous CABG      History and Physical Interval Note:  05/22/2019 5:50 PM  Jeremy Riley  has presented today for surgery, with the diagnosis of chest pain.  The various methods of treatment have been discussed with the patient and family. After consideration of risks, benefits and other options for treatment, the patient has consented to  Procedure(s): LEFT HEART CATH AND CORONARY ANGIOGRAPHY (N/A) as a surgical intervention.  The patient's history has been reviewed, patient examined, no change in status, stable for surgery.  I have reviewed the patient's chart and labs.  Questions were answered to the patient's satisfaction.     Belva Crome III

## 2019-05-22 NOTE — ED Notes (Signed)
[facility] at bedside 

## 2019-05-22 NOTE — H&P (Addendum)
Cardiology H&P    Patient ID: Jeremy Riley; [id number]; 1961-05-31   Admit date: 05/22/2019 Date of Admission: 05/22/2019  Primary Care Provider: Kathyrn Drown, MD Primary Cardiologist: New to [facility] - Dr. Domenic Polite  Patient Profile    Jeremy Riley is a 58 y.o. male with past medical history of HTN, chronic back pain, and prior tobacco use who is being seen today for the evaluation of chest pain at the request of Dr. Ree Kida.  He initially left AMA this morning but presented back to the ED for further evaluation.  History of Present Illness    Jeremy Riley reports being in his usual state of health until the past week. He reports developing "chest tightness" that he initially described as congestion approximately 1 week ago. Since that timeframe, he reports feeling "off". Unsure if symptoms are associated with exertion. Says that his BP has been significantly elevated during this timeframe which is new for him. Reports good compliance with his current medication regimen. He denies any associated dyspnea on exertion, orthopnea, PND, lower extremity edema, or palpitations.  No known personal history of CAD but reports his father required CABG in his 29s. No known HLD or Type 2 DM but FLP this morning showed total cholesterol was elevated 220 and LDL at 134. He is a former smoker and quit smoking cigarettes 10+ years ago but still vapes on a daily basis.   Labs from 05/21/2019 showed WBC 7.2, Hgb 16.5, platelets 224, +139, K+ 3.2, and creatinine 1.12. COVID testing negative. Initial and cyclic troponin values were negative. CXR shows minimal right middle lobe atelectasis. EKG shows NSR, HR 92, with no acute ST or T-wave changes.    Past Medical History:  Diagnosis Date  . History of pneumonia as a child   . Hypertension   . Lumbar disc disease    Fall from ladder    History reviewed. No pertinent surgical history.   Home Medications:  Prior to Admission medications   Medication Sig  Start Date End Date Taking? Authorizing Provider  acetaminophen (TYLENOL) 500 MG tablet Take 500 mg by mouth every 6 (six) hours as needed.    [provider]  amLODipine (NORVASC) 5 MG tablet Take 1 tablet (5 mg total) by mouth daily. 04/23/19   Kathyrn Drown, MD  hydrochlorothiazide (HYDRODIURIL) 12.5 MG tablet Take 1 tablet (12.5 mg total) by mouth daily. 04/23/19   Kathyrn Drown, MD  HYDROcodone-acetaminophen (NORCO/VICODIN) 5-325 MG tablet Take 1 tablet by mouth every 6 (six) hours as needed for moderate pain.    [provider]  HYDROcodone-acetaminophen (NORCO/VICODIN) 5-325 MG tablet Take one every 4  hours prn pain 04/23/19   Kathyrn Drown, MD  HYDROcodone-acetaminophen (VICODIN) 5-325 mg TABS tablet One po Q 6 hours prn pain Patient not taking: Reported on 12/25/2018 02/27/18   Mikey Kirschner, MD  ibuprofen (ADVIL,MOTRIN) 200 MG tablet Take 200 mg by mouth every 6 (six) hours as needed.    [provider]  ondansetron (ZOFRAN ODT) 4 MG disintegrating tablet One po Q 8 hours prn nausea Patient not taking: Reported on 04/23/2019 02/27/18   Mikey Kirschner, MD  potassium chloride (K-DUR) 10 MEQ tablet TAKE 1 TABLET BY MOUTH EVERY DAY 05/13/19   Kathyrn Drown, MD  tamsulosin (FLOMAX) 0.4 MG CAPS capsule TAKE 1 CAPSULE BY MOUTH EVERYDAY AT BEDTIME Patient not taking: Reported on 04/23/2019 04/10/19   Kathyrn Drown, MD    Inpatient Medications:  Scheduled Meds:  Continuous Infusions:  PRN Meds:   Allergies:   No Known Allergies  Social History:   Social History   Socioeconomic History  . Marital status: Married    Spouse name: Not on file  . Number of children: Not on file  . Years of education: Not on file  . Highest education level: Not on file  Occupational History  . Not on file  Social Needs  . Financial resource strain: Not on file  . Food insecurity:    Worry: Not on file    Inability: Not on file  . Transportation needs:    Medical:  Not on file    Non-medical: Not on file  Tobacco Use  . Smoking status: Former Smoker    Types: Cigarettes  . Smokeless tobacco: Current User    Types: Chew  Substance and Sexual Activity  . Alcohol use: Yes    Comment: occ  . Drug use: Not Currently  . Sexual activity: Not on file  Lifestyle  . Physical activity:    Days per week: Not on file    Minutes per session: Not on file  . Stress: Not on file  Relationships  . Social connections:    Talks on phone: Not on file    Gets together: Not on file    Attends religious service: Not on file    Active member of club or organization: Not on file    Attends meetings of clubs or organizations: Not on file    Relationship status: Not on file  . Intimate partner violence:    Fear of current or ex partner: Not on file    Emotionally abused: Not on file    Physically abused: Not on file    Forced sexual activity: Not on file  Other Topics Concern  . Not on file  Social History Narrative  . Not on file     Family History:    Family History  Problem Relation Age of Onset  . Heart disease Father        CABG age 45's      Review of Systems    General:  No chills, fever, night sweats or weight changes.  Cardiovascular:  No dyspnea on exertion, edema, orthopnea, palpitations, paroxysmal nocturnal dyspnea. Positive for chest pain.  Dermatological: No rash, lesions/masses Respiratory: No cough, dyspnea Urologic: No hematuria, dysuria Abdominal:   No nausea, vomiting, diarrhea, bright red blood per rectum, melena, or hematemesis Neurologic:  No visual changes, wkns, changes in mental status. All other systems reviewed and are otherwise negative except as noted above.  Physical Exam/Data    Vitals:   05/22/19 1031 05/22/19 1034 05/22/19 1100  BP:  (!) 159/96 (!) 161/89  Pulse:  91 90  Resp:  (!) 21 (!) 23  Temp:  98.2 F (36.8 C)   TempSrc:  Oral   SpO2:  97% 96%  Weight: 108.9 kg    Height: 5\' 8"  (1.727 m)     No  intake or output data in the 24 hours ending 05/22/19 1221 Filed Weights   05/22/19 1031  Weight: 108.9 kg   Body mass index is 36.49 kg/m.   General: Pleasant, overweight Caucasian male appearing in NAD Psych: Normal affect. Neuro: Alert and oriented X 3. Moves all extremities spontaneously. HEENT: Normal  Neck: Supple without bruits or JVD. Lungs:  Resp regular and unlabored, CTA without wheezing or rales. Heart: RRR no s3, s4, or murmurs. Abdomen: Soft, non-tender, non-distended,  BS + x 4.  Extremities: No clubbing, cyanosis or edema. DP/PT/Radials 2+ and equal bilaterally.   EKG:  The EKG was personally reviewed and demonstrates: NSR, HR 92, with no acute ST or T-wave changes.  Telemetry:  Telemetry was personally reviewed and demonstrates:  NSR, HR in 70's to 90's. No significant arrhythmias.    Labs/Studies     Relevant CV Studies:  None on File  Laboratory Data:  Chemistry Recent Labs  Lab 05/21/19 2119 05/22/19 0606  NA 139 140  K 3.2* 4.3  CL 103 103  CO2 26 25  GLUCOSE 166* 133*  BUN 17 14  CREATININE 1.12 1.05  CALCIUM 9.2 9.1  GFRNONAA >60 >60  GFRAA >60 >60  ANIONGAP 10 12    No results for input(s): PROT, ALBUMIN, AST, ALT, ALKPHOS, BILITOT in the last 168 hours. Hematology Recent Labs  Lab 05/21/19 2119  WBC 7.2  RBC 5.45  HGB 16.5  HCT 47.4  MCV 87.0  MCH 30.3  MCHC 34.8  RDW 12.7  PLT 224   Cardiac Enzymes Recent Labs  Lab 05/21/19 2119 05/21/19 2336 05/22/19 0606  TROPONINI <0.03 <0.03 <0.03   No results for input(s): TROPIPOC in the last 168 hours.  BNPNo results for input(s): BNP, PROBNP in the last 168 hours.  DDimer No results for input(s): DDIMER in the last 168 hours.  Radiology/Studies:  Dg Chest 2 View  Result Date: 05/21/2019 CLINICAL DATA:  Chest pain. EXAM: CHEST - 2 VIEW COMPARISON:  Radiographs of September 10, 2007. FINDINGS: The heart size and mediastinal contours are within normal limits. No pneumothorax or  pleural effusion is noted. Left lung is clear. Minimal right middle lobe subsegmental atelectasis is noted. The visualized skeletal structures are unremarkable. IMPRESSION: Minimal right middle lobe subsegmental atelectasis. Electronically Signed   By: Marijo Conception M.D.   On: 05/21/2019 20:50     Assessment & Plan    1. Chest Pain with Mixed Typical and Atypical Features - Presents with episodes of chest tightness for the past week which can occur at rest or with activity. He denies any associated symptoms but has experienced elevated BP during this timeframe. - initial and cyclic troponin values have been negative. EKG shows no acute ischemic changes.  - he does have multiple cardiac risk factors including HTN, HLD, family history of CAD (father undergoing CABG in his 3's), and tobacco use. Options were reviewed with the patient and given his high pre-test probability, will plan for cardiac catheterization later today. He has been NPO since midnight.  - initiate ASA 81mg  daily and statin therapy.   2. HTN - Will continue PTA Amlodipine 5 mg daily and HCTZ 12.5 mg daily (starting tomorrow given cath today) at the time of admission.  3. HLD  - FLP this admission shows total cholesterol 220, triglycerides 245, HDL 37, and LDL 134. Will initiate high intensity statin therapy.  For questions or updates, please contact [place] Please consult [email or web address] for contact info under Cardiology/STEMI.  Signed, Erma Heritage, PA-C 05/22/2019, 12:21 PM Pager: [phone]   Attending note:  Patient seen and examined.  I reviewed his records and discussed the case with Ms. Ahmed Prima PA-C.  Jeremy Riley is a 58 year old male with history of hypertension, previous cigarette use up to 10 years ago and now with regular vaping, LDL 134 not on statin therapy, also family history of CAD and CABG in his father while in his 44s.  He reports a history of recurring  chest tightness since about a week  ago.  Not specifically worsened with exertion, but recurring and making him feel "not quite right."  He ate some crab cakes yesterday and thought that he might have had indigestion, but states that he felt worse and found that his blood pressure was significantly elevated over baseline despite regular use of his antihypertensive medications.  He was encouraged to come to the ER by his wife and was scheduled for admission, however left AMA only to return to the ER later this morning at the vigorous encouragement of his PCP Dr. Wolfgang Phoenix.  On examination he reports no active chest tightness.  He is afebrile, heart rate in the 90s in sinus rhythm by telemetry which I personally reviewed.  Systolic blood pressure 641R to 160s with interval elevations in diastolic blood pressure.  Lungs are clear without labored breathing.  Cardiac exam reveals RRR without gallop or significant murmur.  Lab work shows negative troponin I levels x3, potassium 4.3, BUN 14, creatinine 1.05, total cholesterol 220 with LDL 134, hemoglobin 16.5, platelets 224, varus 2 negative.  For some minimal right middle lobe atelectasis.  I personally reviewed his ECG which shows sinus rhythm with no acute ST segment changes.  Patient presents with recent onset recurring chest tightness with both typical and atypical features in the setting of hypertension with recent spikes in blood pressure despite compliance with medication, previous tobacco use in the form of cigarettes and more recently use of vaping, and a family history of premature CAD, his father underwent CABG in his 35s.  Although his cardiac markers argue against ACS, symptoms are concerning for unstable angina, and in light of his high pretest probability of ischemic heart disease, we discussed transfer to [facility] for a diagnostic cardiac catheterization.  We discussed the risks and benefits and he is in agreement to proceed.  Also discussed situation with the patient's wife  over the phone.  Satira Sark, M.D., F.A.C.C.

## 2019-05-22 NOTE — ED Notes (Signed)
ED TO INPATIENT HANDOFF REPORT  ED Nurse Name and Phone #: [phone]  S Name/Age/Gender Jeremy Riley 58 y.o. male Room/Bed: APA04/APA04  Code Status   Code Status: Full Code  Home/SNF/Other Home Patient oriented to: self Is this baseline? Yes   Triage Complete: Triage complete  Chief Complaint Chest pains, high Blood pressure   Triage Note Pt presents to ED with complaints of chest tightness which started tonight. Pt states he has also had some nausea.    Allergies No Known Allergies  Level of Care/Admitting Diagnosis ED Disposition    ED Disposition Condition [facility] Area: [facility] [[id number]]  Level of Care: Telemetry [5]  Covid Evaluation: Screening Protocol (No Symptoms)  Diagnosis: Chest pain [518841]  Admitting Physician: DENNIS, HEGEMAN [[id number]]  Attending Physician: Vianne Bulls [[id number]]  PT Class (Do Not Modify): Observation [104]  PT Acc Code (Do Not Modify): Observation [10022]       B Medical/Surgery History Past Medical History:  Diagnosis Date  . Hypertension    History reviewed. No pertinent surgical history.   A IV Location/Drains/Wounds Patient Lines/Drains/Airways Status   Active Line/Drains/Airways    Name:   Placement date:   Placement time:   Site:   Days:   Peripheral IV 05/21/19 Right Antecubital   05/21/19    2228    Antecubital   1          Intake/Output Last 24 hours No intake or output data in the 24 hours ending 05/22/19 0005  Labs/Imaging Results for orders placed or performed during the hospital encounter of 05/21/19 (from the past 48 hour(s))  Basic metabolic panel     Status: Abnormal   Collection Time: 05/21/19  9:19 PM  Result Value Ref Range   Sodium 139 135 - 145 mmol/L   Potassium 3.2 (L) 3.5 - 5.1 mmol/L   Chloride 103 98 - 111 mmol/L   CO2 26 22 - 32 mmol/L   Glucose, Bld 166 (H) 70 - 99 mg/dL   BUN 17 6 - 20 mg/dL   Creatinine, Ser 1.12 0.61 - 1.24 mg/dL   Calcium  9.2 8.9 - 10.3 mg/dL   GFR calc non Af Amer >60 >60 mL/min   GFR calc Af Amer >60 >60 mL/min   Anion gap 10 5 - 15    Comment: Performed at [facility], [address], [place], [place] [address]  CBC     Status: None   Collection Time: 05/21/19  9:19 PM  Result Value Ref Range   WBC 7.2 4.0 - 10.5 K/uL   RBC 5.45 4.22 - 5.81 MIL/uL   Hemoglobin 16.5 13.0 - 17.0 g/dL   HCT 47.4 39.0 - 52.0 %   MCV 87.0 80.0 - 100.0 fL   MCH 30.3 26.0 - 34.0 pg   MCHC 34.8 30.0 - 36.0 g/dL   RDW 12.7 11.5 - 15.5 %   Platelets 224 150 - 400 K/uL   nRBC 0.0 0.0 - 0.2 %    Comment: Performed at [facility], [address], [place], [place] [address]  Troponin I - ONCE - STAT     Status: None   Collection Time: 05/21/19  9:19 PM  Result Value Ref Range   Troponin I <0.03 <0.03 ng/mL    Comment: Performed at [facility], [address], [place], [place] [address]  SARS Coronavirus 2 (CEPHEID - Performed in [place] hospital lab), [facility]  Status: None   Collection Time: 05/21/19 10:01 PM  Result Value Ref Range   SARS Coronavirus 2 NEGATIVE NEGATIVE    Comment: (NOTE) If result is NEGATIVE SARS-CoV-2 target nucleic acids are NOT DETECTED. The SARS-CoV-2 RNA is generally detectable in upper and lower  respiratory specimens during the acute phase of infection. The lowest  concentration of SARS-CoV-2 viral copies this assay can detect is 250  copies / mL. A negative result does not preclude SARS-CoV-2 infection  and should not be used as the sole basis for treatment or other  patient management decisions.  A negative result may occur with  improper specimen collection / handling, submission of specimen other  than nasopharyngeal swab, presence of viral mutation(s) within the  areas targeted by this assay, and inadequate number of viral copies  (<250 copies / mL). A negative result must be combined with clinical  observations, patient history, and epidemiological information. If result is  POSITIVE SARS-CoV-2 target nucleic acids are DETECTED. The SARS-CoV-2 RNA is generally detectable in upper and lower  respiratory specimens dur ing the acute phase of infection.  Positive  results are indicative of active infection with SARS-CoV-2.  Clinical  correlation with patient history and other diagnostic information is  necessary to determine patient infection status.  Positive results do  not rule out bacterial infection or co-infection with other viruses. If result is PRESUMPTIVE POSTIVE SARS-CoV-2 nucleic acids MAY BE PRESENT.   A presumptive positive result was obtained on the submitted specimen  and confirmed on repeat testing.  While 2019 novel coronavirus  (SARS-CoV-2) nucleic acids may be present in the submitted sample  additional confirmatory testing may be necessary for epidemiological  and / or clinical management purposes  to differentiate between  SARS-CoV-2 and other Sarbecovirus currently known to infect humans.  If clinically indicated additional testing with an alternate test  methodology [phone]) is advised. The SARS-CoV-2 RNA is generally  detectable in upper and lower respiratory sp ecimens during the acute  phase of infection. The expected result is Negative. Fact Sheet for Patients:  StrictlyIdeas.no Fact Sheet for Healthcare Providers: BankingDealers.co.za This test is not yet approved or cleared by the [place] FDA and has been authorized for detection and/or diagnosis of SARS-CoV-2 by FDA under an Emergency Use Authorization (EUA).  This EUA will remain in effect (meaning this test can be used) for the duration of the COVID-19 declaration under Section 564(b)(1) of the Act, 21 U.S.C. section 360bbb-3(b)(1), unless the authorization is terminated or revoked sooner. Performed at [facility], [address], [place], [place] [address]    Dg Chest 2 View  Result Date: 05/21/2019 CLINICAL DATA:   Chest pain. EXAM: CHEST - 2 VIEW COMPARISON:  Radiographs of September 10, 2007. FINDINGS: The heart size and mediastinal contours are within normal limits. No pneumothorax or pleural effusion is noted. Left lung is clear. Minimal right middle lobe subsegmental atelectasis is noted. The visualized skeletal structures are unremarkable. IMPRESSION: Minimal right middle lobe subsegmental atelectasis. Electronically Signed   By: Marijo Conception M.D.   On: 05/21/2019 20:50    Pending Labs Unresulted Labs (From admission, onward)    Start     Ordered   05/28/19 0500  Creatinine, serum  (enoxaparin (LOVENOX)    CrCl >/= 30 ml/min)  Weekly,   R    Comments:  while on enoxaparin therapy    05/21/19 2302   05/22/19 0500  HIV antibody (Routine Testing)  Tomorrow morning,   R  05/21/19 2302   05/22/19 4142  Basic metabolic panel  Tomorrow morning,   R     05/21/19 2302   05/22/19 0500  Magnesium  Tomorrow morning,   R     05/21/19 2302   05/22/19 0500  Lipid panel  Tomorrow morning,   R     05/21/19 2304   05/22/19 0000  Troponin I - Now Then Q6H  Now then every 6 hours,   STAT     05/21/19 2302          Vitals/Pain Today's Vitals   05/21/19 2009 05/21/19 2200 05/21/19 2230 05/21/19 2315  BP:  (!) 171/100 (!) 155/93 (!) 156/89  Pulse:  93 88 82  Resp:  (!) 21 (!) 21   Temp:      TempSrc:      SpO2:  98% 100% 98%  Weight: 108.9 kg     Height: 5\' 8"  (1.727 m)     PainSc:        Isolation Precautions No active isolations  Medications Medications  sodium chloride flush (NS) 0.9 % injection 3 mL (has no administration in time range)  amLODipine (NORVASC) tablet 5 mg (has no administration in time range)  hydrochlorothiazide (HYDRODIURIL) tablet 12.5 mg (has no administration in time range)  potassium chloride (K-DUR) CR tablet 10 mEq (has no administration in time range)  aspirin EC tablet 81 mg (has no administration in time range)  nitroGLYCERIN (NITROSTAT) SL tablet 0.4 mg (has  no administration in time range)  acetaminophen (TYLENOL) tablet 650 mg (has no administration in time range)  ondansetron (ZOFRAN) injection 4 mg (has no administration in time range)  enoxaparin (LOVENOX) injection 40 mg (40 mg Subcutaneous Given 05/21/19 2312)  sodium chloride flush (NS) 0.9 % injection 3 mL (has no administration in time range)  sodium chloride flush (NS) 0.9 % injection 3 mL (has no administration in time range)  0.9 %  sodium chloride infusion (has no administration in time range)  morphine 2 MG/ML injection 2-4 mg (has no administration in time range)  metoprolol tartrate (LOPRESSOR) tablet 25 mg (25 mg Oral Given 05/21/19 2311)  aspirin chewable tablet 324 mg (324 mg Oral Given 05/21/19 2207)  potassium chloride SA (K-DUR) CR tablet 20 mEq (20 mEq Oral Given 05/21/19 2311)    Mobility walks Low fall risk   Focused Assessments Cardiac Assessment Handoff:    Lab Results  Component Value Date   TROPONINI <0.03 05/21/2019   No results found for: DDIMER Does the Patient currently have chest pain? No     R Recommendations: See Admitting Provider Note  Report given to:   Additional Notes:

## 2019-05-22 NOTE — ED Notes (Signed)
Cardiologist at bedside.  

## 2019-05-22 NOTE — Discharge Summary (Signed)
Physician Discharge Summary  Jeremy Riley:[id number] DOB: [birth year] DOA: 05/21/2019  PCP: Jeremy Drown, MD  Admit date: 05/21/2019 Discharge date: 05/22/2019  Time spent: 45 minutes  Recommendations for Outpatient Follow-up:  None- as patient left AMA   Discharge Diagnoses:  Principal Problem:   Chest pain Active Problems:   HTN (hypertension)   Hypertensive urgency   Hypokalemia   Discharge Condition: Not stable  Diet recommendation: None  Filed Weights   05/21/19 2009  Weight: 108.9 kg    History of present illness:  On 05/21/2019 on 05/21/2019 by Dr. Mitzi Hansen REMIGIO Riley is a 58 y.o. male with medical history significant for hypertension and obesity (BMI 36), now presenting to the emergency department for evaluation of chest discomfort.  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  He decided to lay down, his wife could see that he was not feeling well, recommended that he check his blood pressure, noted his pressure to be elevated, and came into the ED for evaluation.  Denies any associated shortness of breath, cough, fevers, or chills, and denies sick contacts.  Hospital Course:  Chest pain -Troponin cycled, unremarkable x3 -Pain has been waxing and waning for 2 days, and does not seem to be affected by exertion -Patient does have multiple risk factors including hypertension, hyperlipidemia, family history, smoking -Cardiology was consulted and appreciated -Patient opted to leave [place] prior to cardiology consultation -Advised to stay however again stated he needed to leave  Essential hypertension/hypertensive urgency -Upon arrival to the emergency department, blood pressure was 190/110 -He reports compliance with his  medications -At the time of my assessment, BP was still uncontrolled -He states that he takes amlodipine as well as a fluid pill  Hypokalemia -replaced  Tobacco abuse -States he no longer smokes cigarettes but is now vaping -Smoking Cessation discussed  Procedures: none  Consultations: Cardiology- however patient left prior to being seen  Discharge Exam: Vitals:   05/22/19 0500 05/22/19 0747  BP: 140/82 (!) 153/121  Pulse: 72 81  Resp: 16 18  Temp:  98.8 F (37.1 C)  SpO2: 94% 98%     General: Well developed, well nourished, NAD, appears stated age  HEENT: NCAT, mucous membranes moist.  Neck: Supple  Cardiovascular: S1 S2 auscultated, no rubs, murmurs or gallops. Regular rate and rhythm.  Respiratory: Clear to auscultation bilaterally with equal chest rise  Abdomen: Soft, obese, nontender, nondistended, + bowel sounds  Extremities: warm dry without cyanosis clubbing or edema  Neuro: AAOx3, nonfocal  Psych: Normal affect and demeanor  Discharge Instructions  Allergies as of 05/22/2019   No Known Allergies     Medication List    ASK your doctor about these medications   acetaminophen 500 MG tablet Commonly known as:  TYLENOL Take 500 mg by mouth every 6 (six) hours as needed.   amLODipine 5 MG tablet Commonly known as:  NORVASC Take 1 tablet (5 mg total) by mouth daily.   hydrochlorothiazide 12.5 MG tablet Commonly known as:  HYDRODIURIL Take 1 tablet (12.5 mg total) by mouth daily.   HYDROcodone-acetaminophen 5-325 MG tablet Commonly known as:  NORCO/VICODIN Take 1 tablet by mouth every 6 (six) hours as needed for moderate pain.   HYDROcodone-acetaminophen 5-325 mg Tabs tablet  Commonly known as:  VICODIN One po Q 6 hours prn pain   HYDROcodone-acetaminophen 5-325 MG tablet Commonly known as:  NORCO/VICODIN Take one every 4  hours prn pain   ibuprofen 200 MG tablet Commonly known as:  ADVIL Take 200 mg by mouth every 6 (six) hours as  needed.   ondansetron 4 MG disintegrating tablet Commonly known as:  Zofran ODT One po Q 8 hours prn nausea   potassium chloride 10 MEQ tablet Commonly known as:  K-DUR TAKE 1 TABLET BY MOUTH EVERY DAY   tamsulosin 0.4 MG Caps capsule Commonly known as:  FLOMAX TAKE 1 CAPSULE BY MOUTH EVERYDAY AT BEDTIME      No Known Allergies    The results of significant diagnostics from this hospitalization (including imaging, microbiology, ancillary and laboratory) are listed below for reference.    Significant Diagnostic Studies: Dg Chest 2 View  Result Date: 05/21/2019 CLINICAL DATA:  Chest pain. EXAM: CHEST - 2 VIEW COMPARISON:  Radiographs of September 10, 2007. FINDINGS: The heart size and mediastinal contours are within normal limits. No pneumothorax or pleural effusion is noted. Left lung is clear. Minimal right middle lobe subsegmental atelectasis is noted. The visualized skeletal structures are unremarkable. IMPRESSION: Minimal right middle lobe subsegmental atelectasis. Electronically Signed   By: Marijo Conception M.D.   On: 05/21/2019 20:50    Microbiology: Recent Results (from the past 240 hour(s))  SARS Coronavirus 2 (CEPHEID - Performed in [place] hospital lab), Hosp Order     Status: None   Collection Time: 05/21/19 10:01 PM  Result Value Ref Range Status   SARS Coronavirus 2 NEGATIVE NEGATIVE Final    Comment: (NOTE) If result is NEGATIVE SARS-CoV-2 target nucleic acids are NOT DETECTED. The SARS-CoV-2 RNA is generally detectable in upper and lower  respiratory specimens during the acute phase of infection. The lowest  concentration of SARS-CoV-2 viral copies this assay can detect is 250  copies / mL. A negative result does not preclude SARS-CoV-2 infection  and should not be used as the sole basis for treatment or other  patient management decisions.  A negative result may occur with  improper specimen collection / handling, submission of specimen other  than  nasopharyngeal swab, presence of viral mutation(s) within the  areas targeted by this assay, and inadequate number of viral copies  (<250 copies / mL). A negative result must be combined with clinical  observations, patient history, and epidemiological information. If result is POSITIVE SARS-CoV-2 target nucleic acids are DETECTED. The SARS-CoV-2 RNA is generally detectable in upper and lower  respiratory specimens dur ing the acute phase of infection.  Positive  results are indicative of active infection with SARS-CoV-2.  Clinical  correlation with patient history and other diagnostic information is  necessary to determine patient infection status.  Positive results do  not rule out bacterial infection or co-infection with other viruses. If result is PRESUMPTIVE POSTIVE SARS-CoV-2 nucleic acids MAY BE PRESENT.   A presumptive positive result was obtained on the submitted specimen  and confirmed on repeat testing.  While 2019 novel coronavirus  (SARS-CoV-2) nucleic acids may be present in the submitted sample  additional confirmatory testing may be necessary for epidemiological  and / or clinical management purposes  to differentiate between  SARS-CoV-2 and other Sarbecovirus currently known to infect humans.  If clinically indicated additional testing with an alternate test  methodology [phone]) is advised. The SARS-CoV-2 RNA is generally  detectable in upper and lower respiratory  sp ecimens during the acute  phase of infection. The expected result is Negative. Fact Sheet for Patients:  StrictlyIdeas.no Fact Sheet for Healthcare Providers: BankingDealers.co.za This test is not yet approved or cleared by the [place] FDA and has been authorized for detection and/or diagnosis of SARS-CoV-2 by FDA under an Emergency Use Authorization (EUA).  This EUA will remain in effect (meaning this test can be used) for the duration of the  COVID-19 declaration under Section 564(b)(1) of the Act, 21 U.S.C. section 360bbb-3(b)(1), unless the authorization is terminated or revoked sooner. Performed at [facility], [address] [place], [place] [address]      Labs: Basic Metabolic Panel: Recent Labs  Lab 05/21/19 2119 05/22/19 0606  NA 139 140  K 3.2* 4.3  CL 103 103  CO2 26 25  GLUCOSE 166* 133*  BUN 17 14  CREATININE 1.12 1.05  CALCIUM 9.2 9.1  MG  --  2.3   Liver Function Tests: No results for input(s): AST, ALT, ALKPHOS, BILITOT, PROT, ALBUMIN in the last 168 hours. No results for input(s): LIPASE, AMYLASE in the last 168 hours. No results for input(s): AMMONIA in the last 168 hours. CBC: Recent Labs  Lab 05/21/19 2119  WBC 7.2  HGB 16.5  HCT 47.4  MCV 87.0  PLT 224   Cardiac Enzymes: Recent Labs  Lab 05/21/19 2119 05/21/19 2336 05/22/19 0606  TROPONINI <0.03 <0.03 <0.03   BNP: BNP (last 3 results) No results for input(s): BNP in the last 8760 hours.  ProBNP (last 3 results) No results for input(s): PROBNP in the last 8760 hours.  CBG: No results for input(s): GLUCAP in the last 168 hours.     Signed:  Cristal Ford  [facility] 05/22/2019, 11:58 AM

## 2019-05-22 NOTE — Telephone Encounter (Signed)
Pt returned call and informed that provider reviewed records and pt needs to go back to ER to have full hospital workup and evaluation. Pt verbalized understanding and states he will go back to ED.

## 2019-05-22 NOTE — Telephone Encounter (Signed)
Pt calling today to get appt with dr Richardson Landry. He was at ED last night and left this morning because he states he was told all of his test were normal and he needed to see cardiologist. Pt states he is not having chest pain. Having pressure in chest like congestion, no fever, no cough, no sob. Pt advised dr Richardson Landry will review records and we will call  Him back.  Pt's number [phone]

## 2019-05-22 NOTE — CV Procedure (Signed)
   Unstable angina presentation.  Left heart cath, left ventriculography, coronary angiography, distal circumflex stent implantation via right radial approach using real-time vascular ultrasound guidance.  Distal circumflex 99% --> 0% with 2.5 x 18 Onyx postdilated to 2.75 mm in diameter.  No complications.  Discharge in a.m. if no complications.  Recommend aggressive risk factor modification: LDL less than 55; A1c less than 7, blood pressure 130/80 mmHg, evaluation for sleep apnea, and institute moderate aerobic activity.  Smoking cessation.

## 2019-05-22 NOTE — ED Provider Notes (Addendum)
[facility] EMERGENCY DEPARTMENT Provider Note   CSN: [id number] Arrival date & time: 05/22/19  1017    History   Chief Complaint Chief Complaint  Patient presents with  . Chest Pain    HPI Jeremy Riley is a 58 y.o. male who returns to the emergency department this morning after leaving AMA.  Patient denies any active chest pain at this time.  He was admitted overnight for chest pain rule out as he is high risk for coronary artery disease given his previous risk factors.  He was waiting for cardiology consult but left and went to his primary care physician's office who promptly returned him to the emergency department.  Both with Dr. Ree Kida who had seen the patient earlier this morning.  We have consulted with cardiology to discuss the patient's case.  Patient denies fever chills chest pain shortness of breath wheezing or tightness.     HPI  Past Medical History:  Diagnosis Date  . History of pneumonia as a child   . Hypertension   . Lumbar disc disease    Fall from ladder    Patient Active Problem List   Diagnosis Date Noted  . Chest pain 05/21/2019  . Hypertensive urgency 05/21/2019  . Hypokalemia 05/21/2019  . HTN (hypertension) 10/03/2017    History reviewed. No pertinent surgical history.      Home Medications    Prior to Admission medications   Medication Sig Start Date End Date Taking? Authorizing Provider  acetaminophen (TYLENOL) 500 MG tablet Take 500 mg by mouth every 6 (six) hours as needed.    [provider]  amLODipine (NORVASC) 5 MG tablet Take 1 tablet (5 mg total) by mouth daily. 04/23/19   Kathyrn Drown, MD  hydrochlorothiazide (HYDRODIURIL) 12.5 MG tablet Take 1 tablet (12.5 mg total) by mouth daily. 04/23/19   Kathyrn Drown, MD  HYDROcodone-acetaminophen (NORCO/VICODIN) 5-325 MG tablet Take 1 tablet by mouth every 6 (six) hours as needed for moderate pain.    [provider]  HYDROcodone-acetaminophen (NORCO/VICODIN) 5-325  MG tablet Take one every 4  hours prn pain 04/23/19   Kathyrn Drown, MD  HYDROcodone-acetaminophen (VICODIN) 5-325 mg TABS tablet One po Q 6 hours prn pain Patient not taking: Reported on 12/25/2018 02/27/18   Mikey Kirschner, MD  ibuprofen (ADVIL,MOTRIN) 200 MG tablet Take 200 mg by mouth every 6 (six) hours as needed.    [provider]  ondansetron (ZOFRAN ODT) 4 MG disintegrating tablet One po Q 8 hours prn nausea Patient not taking: Reported on 04/23/2019 02/27/18   Mikey Kirschner, MD  potassium chloride (K-DUR) 10 MEQ tablet TAKE 1 TABLET BY MOUTH EVERY DAY 05/13/19   Kathyrn Drown, MD  tamsulosin (FLOMAX) 0.4 MG CAPS capsule TAKE 1 CAPSULE BY MOUTH EVERYDAY AT BEDTIME Patient not taking: Reported on 04/23/2019 04/10/19   Kathyrn Drown, MD    Family History Family History  Problem Relation Age of Onset  . Heart disease Father        CABG age 1's    Social History Social History   Tobacco Use  . Smoking status: Former Smoker    Types: Cigarettes  . Smokeless tobacco: Current User    Types: Chew  Substance Use Topics  . Alcohol use: Yes    Comment: occ  . Drug use: Not Currently     Allergies   Patient has no known allergies.   Review of Systems Review of Systems  Ten  systems reviewed and are negative for acute change, except as noted in the HPI.   Physical Exam Updated Vital Signs BP (!) 172/104 (BP Location: Left Arm)   Pulse 87   Temp 98.4 F (36.9 C) (Oral)   Resp 15   Ht 5\' 8"  (1.727 m)   Wt 114.6 kg   SpO2 96%   BMI 38.42 kg/m   Physical Exam Physical Exam  Nursing note and vitals reviewed. Constitutional: He appears well-developed and well-nourished. No distress.  HENT:  Head: Normocephalic and atraumatic.  Eyes: Conjunctivae normal are normal. No scleral icterus.  Neck: Normal range of motion. Neck supple.  Cardiovascular: Normal rate, regular rhythm and normal heart sounds.   Pulmonary/Chest: Effort normal and breath sounds  normal. No respiratory distress.  Abdominal: Soft. There is no tenderness.  Musculoskeletal: He exhibits no edema.  Neurological: He is alert.  Skin: Skin is warm and dry. He is not diaphoretic.  Psychiatric: His behavior is normal.   Who presents emergency department chief complaint of left shoulder and anterior shoulder blade pain.  ED Treatments / Results  Labs (all labs ordered are listed, but only abnormal results are displayed) Labs Reviewed - No data to display  EKG None  Radiology Dg Chest 2 View  Result Date: 05/21/2019 CLINICAL DATA:  Chest pain. EXAM: CHEST - 2 VIEW COMPARISON:  Radiographs of September 10, 2007. FINDINGS: The heart size and mediastinal contours are within normal limits. No pneumothorax or pleural effusion is noted. Left lung is clear. Minimal right middle lobe subsegmental atelectasis is noted. The visualized skeletal structures are unremarkable. IMPRESSION: Minimal right middle lobe subsegmental atelectasis. Electronically Signed   By: Marijo Conception M.D.   On: 05/21/2019 20:50    Procedures Procedures (including critical care time)  Medications Ordered in ED Medications  sodium chloride flush (NS) 0.9 % injection 3 mL (3 mLs Intravenous Given 05/22/19 1234)  sodium chloride flush (NS) 0.9 % injection 3 mL (has no administration in time range)  0.9 %  sodium chloride infusion (has no administration in time range)  0.9 %  sodium chloride infusion ( Intravenous New Bag/Given 05/22/19 1242)  HYDROcodone-acetaminophen (NORCO/VICODIN) 5-325 MG per tablet 1 tablet (has no administration in time range)  amLODipine (NORVASC) tablet 5 mg (5 mg Oral Not Given 05/22/19 1449)  hydrochlorothiazide (HYDRODIURIL) tablet 12.5 mg (has no administration in time range)  aspirin EC tablet 81 mg (has no administration in time range)  nitroGLYCERIN (NITROSTAT) SL tablet 0.4 mg (has no administration in time range)  acetaminophen (TYLENOL) tablet 650 mg (has no administration  in time range)  ondansetron (ZOFRAN) injection 4 mg (has no administration in time range)  enoxaparin (LOVENOX) injection 40 mg (has no administration in time range)  atorvastatin (LIPITOR) tablet 40 mg (has no administration in time range)  aspirin chewable tablet 81 mg (81 mg Oral Given 05/22/19 1459)     Initial Impression / Assessment and Plan / ED Course  I have reviewed the triage vital signs and the nursing notes.  Pertinent labs & imaging results that were available during my care of the patient were reviewed by me and considered in my medical decision making (see chart for details).       Patient will be admitted by cardiology service for further management and evaluation.  He is stable throughout his visit here in the emergency department Final Clinical Impressions(s) / ED Diagnoses   Final diagnoses:  Chest pain with moderate risk of acute coronary syndrome  ED Discharge Orders    None       Margarita Mail, PA-C 05/22/19 1640    Margarita Mail, PA-C 05/22/19 1641    Maudie Flakes, MD 05/23/19 0700

## 2019-05-22 NOTE — Telephone Encounter (Signed)
[facility] as soon as possible

## 2019-05-22 NOTE — Telephone Encounter (Signed)
I reviewed all the records including what he told the physicians, I think he made a serious mistake by leaving, he could be on the edge of a massive heart attack. There is no way we can know that or not without him going through a hospital level work up and evaluation. We do not have the3 capability of doi8ng that in the office. He needs to go back to the er

## 2019-05-23 ENCOUNTER — Other Ambulatory Visit (HOSPITAL_COMMUNITY): Payer: BLUE CROSS/BLUE SHIELD

## 2019-05-23 ENCOUNTER — Encounter (HOSPITAL_COMMUNITY): Payer: Self-pay | Admitting: Interventional Cardiology

## 2019-05-23 ENCOUNTER — Other Ambulatory Visit: Payer: Self-pay | Admitting: Student

## 2019-05-23 DIAGNOSIS — I200 Unstable angina: Secondary | ICD-10-CM | POA: Diagnosis not present

## 2019-05-23 DIAGNOSIS — I160 Hypertensive urgency: Secondary | ICD-10-CM | POA: Diagnosis not present

## 2019-05-23 DIAGNOSIS — I25110 Atherosclerotic heart disease of native coronary artery with unstable angina pectoris: Secondary | ICD-10-CM | POA: Diagnosis not present

## 2019-05-23 DIAGNOSIS — Z1159 Encounter for screening for other viral diseases: Secondary | ICD-10-CM | POA: Diagnosis not present

## 2019-05-23 DIAGNOSIS — I10 Essential (primary) hypertension: Secondary | ICD-10-CM | POA: Diagnosis not present

## 2019-05-23 DIAGNOSIS — I2510 Atherosclerotic heart disease of native coronary artery without angina pectoris: Secondary | ICD-10-CM | POA: Diagnosis present

## 2019-05-23 DIAGNOSIS — R7303 Prediabetes: Secondary | ICD-10-CM | POA: Diagnosis present

## 2019-05-23 DIAGNOSIS — E876 Hypokalemia: Secondary | ICD-10-CM

## 2019-05-23 DIAGNOSIS — E785 Hyperlipidemia, unspecified: Secondary | ICD-10-CM | POA: Diagnosis present

## 2019-05-23 LAB — BASIC METABOLIC PANEL
Anion gap: 10 (ref 5–15)
BUN: 12 mg/dL (ref 6–20)
CO2: 26 mmol/L (ref 22–32)
Calcium: 8.9 mg/dL (ref 8.9–10.3)
Chloride: 103 mmol/L (ref 98–111)
Creatinine, Ser: 1.09 mg/dL (ref 0.61–1.24)
GFR calc Af Amer: >60 mL/min (ref >60)
GFR calc non Af Amer: >60 mL/min (ref >60)
Glucose, Bld: 120 mg/dL — ABNORMAL HIGH (ref 70–99)
Potassium: 3.4 mmol/L — ABNORMAL LOW (ref 3.5–5.1)
Sodium: 139 mmol/L (ref 135–145)

## 2019-05-23 LAB — CBC
HCT: 44.4 % (ref 39.0–52.0)
Hemoglobin: 15.8 g/dL (ref 13.0–17.0)
MCH: 30.7 pg (ref 26.0–34.0)
MCHC: 35.6 g/dL (ref 30.0–36.0)
MCV: 86.2 fL (ref 80.0–100.0)
Platelets: 228 10*3/uL (ref 150–400)
RBC: 5.15 MIL/uL (ref 4.22–5.81)
RDW: 12.6 % (ref 11.5–15.5)
WBC: 7.2 10*3/uL (ref 4.0–10.5)
nRBC: 0 % (ref 0.0–0.2)

## 2019-05-23 LAB — HEMOGLOBIN A1C
Hgb A1c MFr Bld: 6.3 % — ABNORMAL HIGH (ref 4.8–5.6)
Mean Plasma Glucose: 134.11 mg/dL

## 2019-05-23 LAB — HIV ANTIBODY (ROUTINE TESTING W REFLEX): HIV Screen 4th Generation wRfx: NONREACTIVE

## 2019-05-23 MED ORDER — POTASSIUM CHLORIDE CRYS ER 20 MEQ PO TBCR
20.0000 meq | EXTENDED_RELEASE_TABLET | Freq: Once | ORAL | Status: AC
  Filled 2019-05-23: qty 1

## 2019-05-23 MED ORDER — CARVEDILOL 3.125 MG PO TABS: 3.1250 mg | ORAL_TABLET | Freq: Two times a day (BID) | ORAL | 2 refills | Status: DC

## 2019-05-23 MED ORDER — LOSARTAN POTASSIUM 50 MG PO TABS
100.0000 mg | ORAL_TABLET | Freq: Every day | ORAL | Status: DC
  Administered 2019-05-23: 09:00:00 100 mg via ORAL
  Filled 2019-05-23: qty 2

## 2019-05-23 MED ORDER — NITROGLYCERIN 0.4 MG SL SUBL: 0.4000 mg | SUBLINGUAL_TABLET | SUBLINGUAL | 1 refills | Status: DC | PRN

## 2019-05-23 MED ORDER — TICAGRELOR 90 MG PO TABS: 90.0000 mg | ORAL_TABLET | Freq: Two times a day (BID) | ORAL | 11 refills | Status: DC

## 2019-05-23 MED ORDER — LOSARTAN POTASSIUM-HCTZ 100-12.5 MG PO TABS: 1.0000 | ORAL_TABLET | Freq: Every day | ORAL | 2 refills | Status: DC

## 2019-05-23 MED ORDER — ASPIRIN 81 MG PO CHEW: 81.0000 mg | CHEWABLE_TABLET | Freq: Every day | ORAL | Status: AC

## 2019-05-23 MED ORDER — CARVEDILOL 3.125 MG PO TABS
3.1250 mg | ORAL_TABLET | Freq: Two times a day (BID) | ORAL | Status: DC
  Administered 2019-05-23: 3.125 mg via ORAL
  Filled 2019-05-23: qty 1

## 2019-05-23 MED ORDER — ATORVASTATIN CALCIUM 80 MG PO TABS: 80.0000 mg | ORAL_TABLET | Freq: Every day | ORAL | 2 refills | Status: DC

## 2019-05-23 MED ORDER — POTASSIUM CHLORIDE CRYS ER 20 MEQ PO TBCR
40.0000 meq | EXTENDED_RELEASE_TABLET | Freq: Once | ORAL | Status: DC
  Administered 2019-05-23: 40 meq via ORAL

## 2019-05-23 MED FILL — NITROGLYCERIN 0.4 MG TAB SL: 0.4 | 7 days supply | Qty: 25 | Fill #0

## 2019-05-23 MED FILL — ATORVASTATIN CALCIUM 80 MG: 80 | 30 days supply | Qty: 30 | Fill #0

## 2019-05-23 MED FILL — BRILINTA 90 MG TABLET: 90 | 30 days supply | Qty: 60 | Fill #0

## 2019-05-23 MED FILL — CARVEDILOL 3.125 MG TABLET: 3.125 | 30 days supply | Qty: 60 | Fill #0

## 2019-05-23 MED FILL — LOSARTAN-HCTZ 100-12.5 MG T: 100-12.5 | 30 days supply | Qty: 30 | Fill #0

## 2019-05-23 NOTE — Progress Notes (Signed)
CARDIAC REHAB PHASE I   PRE:  Rate/Rhythm: 91 SR    BP: sitting 152/105    SaO2:   MODE:  Ambulation: 600 ft   POST:  Rate/Rhythm: 103 ST    BP: sitting 169/103     SaO2:   Tolerated well, no c/o. BP elevated, hasn't gotten new meds yet. Ed completed with great reception. He wants to make change. Understands need for Brilinta/ASA. Gave stent card. Discussed vaping cessation, pre DM and diet changes, exercise, NTG, and CRPII. Will refer to [facility]. Pt interested in app.  Pt is interested in participating in Virtual Cardiac Rehab. Pt advised that Virtual Cardiac Rehab is provided at no cost to the patient.  Checklist:  1. Pt has smart device  ie smartphone and/or ipad for downloading an app  Yes 2. Reliable internet/wifi service    Yes 3. Understands how to use their smartphone and navigate within an app.  Yes 4.  Reviewed with pt the scheduling process for virtual cardiac rehab.  Pt verbalized understanding.  [place], ACSM 05/23/2019 9:12 AM

## 2019-05-23 NOTE — Discharge Instructions (Signed)
-   Please take all medications as prescribed elsewhere on your discharge paperwork. - We have made adjustments to your blood pressure medications. Please check your blood pressure daily and keep a log of these to bring to your office visit in 2 weeks.  - For now, stop taking the potassium supplement. We will recheck your potassium and kidney function next week. Depending on what your potassium level is at that time, we may restart it.  - Please go to [facility] on 05/30/2019 for lab work. - Please call our office with any questions or concerns.   Post Cardiac Catheterization: NO HEAVY LIFTING OR SEXUAL ACTIVITY X 7 DAYS. NO DRIVING X 3-5 DAYS. NO SOAKING BATHS, HOT TUBS, POOLS, ETC., X 7 DAYS.  Radial Site Care: Refer to this sheet in the next few weeks. These instructions provide you with information on caring for yourself after your procedure. Your caregiver may also give you more specific instructions. Your treatment has been planned according to current medical practices, but problems sometimes occur. Call your caregiver if you have any problems or questions after your procedure. HOME CARE INSTRUCTIONS  You may shower the day after the procedure.Remove the bandage (dressing) and gently wash the site with plain soap and water.Gently pat the site dry.   Do not apply powder or lotion to the site.   Do not submerge the affected site in water for 3 to 5 days.   Inspect the site at least twice daily.   Do not flex or bend the affected arm for 24 hours.   No lifting over 5 pounds (2.3 kg) for 5 days after your procedure.   Do not drive home if you are discharged the same day of the procedure. Have someone else drive you.  What to expect:  Any bruising will usually fade within 1 to 2 weeks.   Blood that collects in the tissue (hematoma) may be painful to the touch. It should usually decrease in size and tenderness within 1 to 2 weeks.  SEEK IMMEDIATE MEDICAL CARE IF:  You have  unusual pain at the radial site.   You have redness, warmth, swelling, or pain at the radial site.   You have drainage (other than a small amount of blood on the dressing).   You have chills.   You have a fever or persistent symptoms for more than 72 hours.   You have a fever and your symptoms suddenly get worse.   Your arm becomes pale, cool, tingly, or numb.   You have heavy bleeding from the site. Hold pressure on the site.

## 2019-05-23 NOTE — Progress Notes (Signed)
Primary Cardiologist :  Domenic Polite  Subjective:  Denies SSCP, palpitations or Dyspnea   Objective:  Vitals:   05/22/19 2355 05/23/19 0513 05/23/19 0624 05/23/19 0800  BP: (!) 149/91  (!) 148/100 (!) 152/105  Pulse:  74    Resp:      Temp:  98.2 F (36.8 C)    TempSrc:  Oral    SpO2:  97%  100%  Weight:  114.5 kg    Height:        Intake/Output from previous day:  Intake/Output Summary (Last 24 hours) at 05/23/2019 0810 Last data filed at 05/23/2019 0800 Gross per 24 hour  Intake 1281.37 ml  Output -  Net 1281.37 ml    Physical Exam: Affect appropriate Healthy:  appears stated age HEENT: normal Neck supple with no adenopathy JVP normal no bruits no thyromegaly Lungs clear with no wheezing and good diaphragmatic motion Heart:  S1/S2 no murmur, no rub, gallop or click PMI normal Abdomen: benighn, BS positve, no tenderness, no AAA no bruit.  No HSM or HJR Distal pulses intact with no bruits No edema Neuro non-focal Skin warm and dry No muscular weakness Right radial A   Lab Results: Basic Metabolic Panel: Recent Labs    05/22/19 0606 05/22/19 2022 05/23/19 0342  NA 140  --  139  K 4.3  --  3.4*  CL 103  --  103  CO2 25  --  26  GLUCOSE 133*  --  120*  BUN 14  --  12  CREATININE 1.05 1.04 1.09  CALCIUM 9.1  --  8.9  MG 2.3  --   --    Liver Function Tests: No results for input(s): AST, ALT, ALKPHOS, BILITOT, PROT, ALBUMIN in the last 72 hours. No results for input(s): LIPASE, AMYLASE in the last 72 hours. CBC: Recent Labs    05/22/19 2022 05/23/19 0342  WBC 10.4 7.2  HGB 16.5 15.8  HCT 46.1 44.4  MCV 85.7 86.2  PLT 275 228   Cardiac Enzymes: Recent Labs    05/21/19 2119 05/21/19 2336 05/22/19 0606  TROPONINI <0.03 <0.03 <0.03   BNP: Invalid input(s): POCBNP D-Dimer: No results for input(s): DDIMER in the last 72 hours. Hemoglobin A1C: Recent Labs    05/23/19 0342  HGBA1C 6.3*   Fasting Lipid Panel: Recent Labs    05/22/19  0606  CHOL 220*  HDL 37*  LDLCALC 134*  TRIG 245*  CHOLHDL 5.9   Thyroid Function Tests: No results for input(s): TSH, T4TOTAL, T3FREE, THYROIDAB in the last 72 hours.  Invalid input(s): FREET3 Anemia Panel: No results for input(s): VITAMINB12, FOLATE, FERRITIN, TIBC, IRON, RETICCTPCT in the last 72 hours.  Imaging: Dg Chest 2 View  Result Date: 05/21/2019 CLINICAL DATA:  Chest pain. EXAM: CHEST - 2 VIEW COMPARISON:  Radiographs of September 10, 2007. FINDINGS: The heart size and mediastinal contours are within normal limits. No pneumothorax or pleural effusion is noted. Left lung is clear. Minimal right middle lobe subsegmental atelectasis is noted. The visualized skeletal structures are unremarkable. IMPRESSION: Minimal right middle lobe subsegmental atelectasis. Electronically Signed   By: Marijo Conception M.D.   On: 05/21/2019 20:50    Cardiac Studies:  ECG: NSR rate 80 normal    Telemetry: NSR 05/23/2019   Echo: will cancel   Medications:   . amLODipine  5 mg Oral Daily  . aspirin  81 mg Oral Daily  . atorvastatin  80 mg Oral q1800  . heparin  5,000 Units Subcutaneous Q8H  . hydrochlorothiazide  12.5 mg Oral Daily  . sodium chloride flush  3 mL Intravenous Q12H  . sodium chloride flush  3 mL Intravenous Q12H  . ticagrelor  90 mg Oral BID     . sodium chloride 100 mL/hr at 05/23/19 0802  . sodium chloride      Assessment/Plan:   Chest Pain:  CAD with new DES to distal circumflex DAT  HTN:  Increase coreg D/C norvasc Add Hyzaar low sodium diet HLD:  Continue high does statin  D/c home today  Jenkins Rouge 05/23/2019, 8:10 AM

## 2019-05-23 NOTE — Care Management (Signed)
[id number] 05-23-19 Brilinta filled via [place]. CM did provide patient with additional Brilinta discount card. No further needs identified at this time. Bethena Roys, RN,BSN Case Manager [phone]

## 2019-05-23 NOTE — Discharge Summary (Signed)
Discharge Summary    Patient ID: Jeremy Riley MRN: [id number]; DOB: December 30, 1960  Admit date: 05/22/2019 Discharge date: 05/23/2019  Primary Care Provider: Kathyrn Drown, MD  Primary Cardiologist: Rozann Lesches, MD (New Patient) Primary Electrophysiologist:  None   Discharge Diagnoses    Principal Problem:   Unstable angina [facility]) Active Problems:   HTN (hypertension)   Hypokalemia   Hyperlipidemia   Pre-diabetes   CAD (coronary artery disease)   Allergies No Known Allergies  Diagnostic Studies/Procedures    Left Heart Catheterization 05/22/2019:  Unstable angina pectoris  Successful DES implantation in distal circumflex reducing a culprit 99% stenosis to 0% using a 2.5 x 18 Onyx postdilated to 2.75 mm.  TIMI grade III flow noted.  Normal left main.  Luminal irregularities in LAD  Luminal irregularities and dominant right coronary  Recommendations:  Aggressive secondary risk factor modification: Consider the diagnosis of sleep apnea; hemoglobin A1c should be less than 7; LDL cholesterol less than 70 and preferably 55 or less; blood pressure 130/80 mmHg or less; moderate aerobic activity; discontinue vaping.  Eligible for discharge in a.m. if no problems.  Diagnostic Dominance: Right  Intervention    History of Present Illness     Jeremy Riley is a 58 year old male with a history of hypertension, chronic back pain, and prior tobacco use but no known history of CAD. Patient presented to the [facility] ED on 05/21/2019 for evaluation of chest pain with associated nausea. Initial troponin negative. Patient was admitted for ACS rule out and Cardiology was consulted. However, patient ended up leaving AMA yesterday before Cardiology consultation. Patient returned to the ED later yesterday for further evaluation.   Patient reports being in his usual state of health until the past week. He reports developing "chest tightness" that he initially described as  congestion approximately 1 week ago. Since that timeframe, he reports feeling "off". Unsure if symptoms are associated with exertion. Says that his BP has been significantly elevated during this timeframe which is new for him. Reports good compliance with his current medication regimen. He denies any associated dyspnea on exertion, orthopnea, PND, lower extremity edema, or palpitations.  No known personal history of CAD but reports his father required CABG in his 35s. No known hyperlipdiemia or type 2 diabetes but fasting lipid panel showed total cholesterol was elevated 220 and LDL at 134. He is a former smoker and quit smoking cigarettes 10+ years ago but still vapes on a daily basis.   Labs from 05/21/2019 showed WBC 7.2, Hgb 16.5, platelets 224, Na+ 140, K+ 4.3,  and creatinine 1.09. COVID testing negative. Initial and cyclic troponin values were negative. Chest x-ray shows minimal right middle lobe atelectasis. EKG shows normal sinus rhythm, rate 92, with no acute ST or T-wave changes.   Symptoms concerning for unstable angina so patient was transferred to [facility] for cardiac catheterization.   Hospital Course     Consultants: None  Unstable Angina Patient admitted at [facility] on 05/22/2019 and transferred to [facility] for cardiac catheterization as stated above. Troponin remained negative x3. Left cardiac catheterization showed 99% stenosis of the distal circumflex with luminal irregularities noted in the LAD and dominant RCA. Patient underwent success PCI with DES to the distal circumflex. Patient tolerated the procedure well. Renal function stable. Right radial cath site soft with no signs of hematoma. Patient able to ambulate this morning without any problems.  - Continue dual antiplatelet therapy with Aspirin 81mg  daily and Brilinta  90mg  twice daily.  - Patient will need aggressive secondary risk factor modification. LDL goal less than 70 and preferable 55 or less. BP goal 130/80 mmHg  or less. High intensity statin and beta-blocker added this admission. Consider sleep study for evaluation of sleep apnea.  Hypertension BP has been poorly controlled with systolic BP as high as 694 at one point. Most recent BP 152/105.  - Home Amlodipine discontinued.  - Home HCTZ 12.5mg  will be switched to Losartan-HCTZ combo (Hyzar) 100-12.5mg  daily. - Coreg 3.125mg  twice daily added this admission. May be able to up-titrate as outpatient. - Advised patient to measure BP daily and keep a log to bring to follow-up visit.  - Will recheck BMET in 1 week after starting ARB.  Hyperlipidemia Lipid panel this admission: Cholesterol 220, Triglycerides 245, HDL 37, LDL 134. LDL goal <70 and preferable 55 or less given CAD. - Continue Lipitor 80mg  daily (started this admission). - Will need repeat lipid panel and CMP in 6 weeks.   Pre-Diabetes  Hemoglobin A1c 6.3 this admission, consistent with pre-diabetes. Will need to follow-up with PCP.  Hypokalemia Patient mildly hypokalemic over the last couple of day with lowest potassium of 3.2 on 05/21/2019. Patient on K-Dur 10 mEq daily at home. Potassium 3.4 this morning. Dose of K-Dur 20 mEq ordered. - Will discontinue home K-Dur at discharge given initiation of ARB. Will recheck BMET in 1 week.   Patient seen and examined by Dr. Johnsie Cancel today and determined to be stable for discharge. Outpatient follow-up has been arranged. Medications as below.  _____________  Discharge Vitals Blood pressure (!) 152/105, pulse 85, temperature 98.2 F (36.8 C), temperature source Oral, resp. rate (!) 0, height 5\' 8"  (1.727 m), weight 114.5 kg, SpO2 100 %.  Filed Weights   05/22/19 1031 05/22/19 1446 05/23/19 0513  Weight: 108.9 kg 114.6 kg 114.5 kg    Labs & Radiologic Studies    CBC Recent Labs    05/22/19 2022 05/23/19 0342  WBC 10.4 7.2  HGB 16.5 15.8  HCT 46.1 44.4  MCV 85.7 86.2  PLT 275 854   Basic Metabolic Panel Recent Labs    05/22/19 0606  05/22/19 2022 05/23/19 0342  NA 140  --  139  K 4.3  --  3.4*  CL 103  --  103  CO2 25  --  26  GLUCOSE 133*  --  120*  BUN 14  --  12  CREATININE 1.05 1.04 1.09  CALCIUM 9.1  --  8.9  MG 2.3  --   --    Liver Function Tests No results for input(s): AST, ALT, ALKPHOS, BILITOT, PROT, ALBUMIN in the last 72 hours. No results for input(s): LIPASE, AMYLASE in the last 72 hours. Cardiac Enzymes Recent Labs    05/21/19 2119 05/21/19 2336 05/22/19 0606  TROPONINI <0.03 <0.03 <0.03   BNP Invalid input(s): POCBNP D-Dimer No results for input(s): DDIMER in the last 72 hours. Hemoglobin A1C Recent Labs    05/23/19 0342  HGBA1C 6.3*   Fasting Lipid Panel Recent Labs    05/22/19 0606  CHOL 220*  HDL 37*  LDLCALC 134*  TRIG 245*  CHOLHDL 5.9   Thyroid Function Tests No results for input(s): TSH, T4TOTAL, T3FREE, THYROIDAB in the last 72 hours.  Invalid input(s): FREET3 _____________  Dg Chest 2 View  Result Date: 05/21/2019 CLINICAL DATA:  Chest pain. EXAM: CHEST - 2 VIEW COMPARISON:  Radiographs of September 10, 2007. FINDINGS: The heart size and mediastinal  contours are within normal limits. No pneumothorax or pleural effusion is noted. Left lung is clear. Minimal right middle lobe subsegmental atelectasis is noted. The visualized skeletal structures are unremarkable. IMPRESSION: Minimal right middle lobe subsegmental atelectasis. Electronically Signed   By: Marijo Conception M.D.   On: 05/21/2019 20:50   Disposition   Patient is being discharged home today in good condition.  Follow-up Plans & Appointments    Follow-up Information    Erma Heritage, PA-C Follow up.   Specialties: Physician Assistant, Cardiology Why: You have a follow-up visit scheduled for 06/07/2019 at 1:30pm with Bernerd Pho, one of Dr. Myles Gip PAs. Please arrive 15 minutes early for check-in. Contact information: [place] [address] [phone]        [facility] Follow up.   Why: Please go by the [facility] next Thursday 05/30/2019 for lab work. We will be checking a BMET at that time to monitor your potassium and kidney funciton. Contact information: [address] [phone]       Kathyrn Drown, MD Follow up.   Specialty: Family Medicine Why: Please call your primary care physician's office to schedule a follow-up visit within 1-2 weeks.  Contact information: [address] [place] [address] [phone]          Discharge Instructions    Amb Referral to Cardiac Rehabilitation   Complete by: As directed    [email or web address]   Diagnosis:  Coronary Stents PTCA     After initial evaluation and assessments completed: Virtual Based Care may be provided alone or in conjunction with Phase 2 Cardiac Rehab based on patient barriers.: Yes   Diet - low sodium heart healthy   Complete by: As directed    Increase activity slowly   Complete by: As directed       Discharge Medications   Allergies as of 05/23/2019   No Known Allergies     Medication List    STOP taking these medications   amLODipine 5 MG tablet Commonly known as: NORVASC   hydrochlorothiazide 12.5 MG tablet Commonly known as: HYDRODIURIL   HYDROcodone-acetaminophen 5-325 MG tablet Commonly known as: NORCO/VICODIN   HYDROcodone-acetaminophen 5-325 mg Tabs tablet Commonly known as: VICODIN   ibuprofen 200 MG tablet Commonly known as: ADVIL   ondansetron 4 MG disintegrating tablet Commonly known as: Zofran ODT   potassium chloride 10 MEQ tablet Commonly known as: K-DUR   tamsulosin 0.4 MG Caps capsule Commonly known as: FLOMAX     TAKE these medications   acetaminophen 500 MG tablet Commonly known as: TYLENOL Take 500 mg by mouth every 6 (six) hours as needed.   aspirin 81 MG chewable tablet Chew 1 tablet (81 mg total) by mouth daily. Start taking on: May 24, 2019    atorvastatin 80 MG tablet Commonly known as: LIPITOR Take 1 tablet (80 mg total) by mouth daily at 6 PM.   carvedilol 3.125 MG tablet Commonly known as: COREG Take 1 tablet (3.125 mg total) by mouth 2 (two) times daily with a meal.   losartan-hydrochlorothiazide 100-12.5 MG tablet Commonly known as: HYZAAR Take 1 tablet by mouth daily.   nitroGLYCERIN 0.4 MG SL tablet Commonly known as: NITROSTAT Place 1 tablet (0.4 mg total) under the tongue every 5 (five) minutes x 3 doses as needed for chest pain.   ticagrelor 90 MG Tabs tablet Commonly known as: BRILINTA Take 1 tablet (90 mg total)  by mouth 2 (two) times daily.        Acute coronary syndrome (MI, NSTEMI, STEMI, etc) this admission?: No.    Outstanding Labs/Studies   Repeat BMET in 1 week after starting ARB. Repeat lipid panel and CMP in 6 weeks after starting high intensity statin.  Duration of Discharge Encounter   Greater than 30 minutes including physician time.  Signed, Darreld Mclean, PA-C 05/23/2019, 10:30 AM

## 2019-05-24 ENCOUNTER — Telehealth: Payer: Self-pay | Admitting: Family Medicine

## 2019-05-24 NOTE — Telephone Encounter (Signed)
Pt's wife would like to extend a HUGE thank you to Dr. Richardson Landry. Pt went back to the ER like he was told and had to be transferred to [facility] and have stent put in. They are very grateful to be apart of our practice and to have amazing doctors looking out for their well being. Pt has a follow up on 6/24 with Dr. Nicki Reaper

## 2019-05-24 NOTE — Telephone Encounter (Signed)
thanks

## 2019-05-30 ENCOUNTER — Other Ambulatory Visit (HOSPITAL_COMMUNITY)
Admission: RE | Admit: 2019-05-30 | Discharge: 2019-05-30 | Disposition: A | Discharge status: Home / Self Care | Payer: BLUE CROSS/BLUE SHIELD | Source: Ambulatory Visit | Attending: Student | Admitting: Student

## 2019-05-30 ENCOUNTER — Other Ambulatory Visit: Payer: Self-pay

## 2019-05-30 DIAGNOSIS — E876 Hypokalemia: Secondary | ICD-10-CM | POA: Diagnosis not present

## 2019-05-30 LAB — BASIC METABOLIC PANEL
Anion gap: 10 (ref 5–15)
BUN: 20 mg/dL (ref 6–20)
CO2: 26 mmol/L (ref 22–32)
Calcium: 9.4 mg/dL (ref 8.9–10.3)
Chloride: 103 mmol/L (ref 98–111)
Creatinine, Ser: 1.11 mg/dL (ref 0.61–1.24)
GFR calc Af Amer: >60 mL/min (ref >60)
GFR calc non Af Amer: >60 mL/min (ref >60)
Glucose, Bld: 130 mg/dL — ABNORMAL HIGH (ref 70–99)
Potassium: 3.8 mmol/L (ref 3.5–5.1)
Sodium: 139 mmol/L (ref 135–145)

## 2019-06-04 ENCOUNTER — Telehealth: Payer: Self-pay | Admitting: Student

## 2019-06-04 NOTE — Telephone Encounter (Signed)
Virtual Visit Pre-Appointment Phone Call  "(Name), I am calling you today to discuss your upcoming appointment. We are currently trying to limit exposure to the virus that causes COVID-19 by seeing patients at home rather than in the office."  1. "What is the BEST phone number to call the day of the visit?" - include this in appointment notes  2. Do you have or have access to (through a family member/friend) a smartphone with video capability that we can use for your visit?" a. If yes - list this number in appt notes as cell (if different from BEST phone #) and list the appointment type as a VIDEO visit in appointment notes b. If no - list the appointment type as a PHONE visit in appointment notes  3. Confirm consent - "In the setting of the current Covid19 crisis, you are scheduled for a (phone or video) visit with your provider on (date) at (time).  Just as we do with many in-office visits, in order for you to participate in this visit, we must obtain consent.  If you'd like, I can send this to your mychart (if signed up) or email for you to review.  Otherwise, I can obtain your verbal consent now.  All virtual visits are billed to your insurance company just like a normal visit would be.  By agreeing to a virtual visit, we'd like you to understand that the technology does not allow for your provider to perform an examination, and thus may limit your provider's ability to fully assess your condition. If your provider identifies any concerns that need to be evaluated in person, we will make arrangements to do so.  Finally, though the technology is pretty good, we cannot assure that it will always work on either your or our end, and in the setting of a video visit, we may have to convert it to a phone-only visit.  In either situation, we cannot ensure that we have a secure connection.  Are you willing to proceed?" STAFF: Did the patient verbally acknowledge consent to telehealth visit? Document  YES/NO here: Yes  4. Advise patient to be prepared - "Two hours prior to your appointment, go ahead and check your blood pressure, pulse, oxygen saturation, and your weight (if you have the equipment to check those) and write them all down. When your visit starts, your provider will ask you for this information. If you have an Apple Watch or Kardia device, please plan to have heart rate information ready on the day of your appointment. Please have a pen and paper handy nearby the day of the visit as well."  5. Give patient instructions for MyChart download to smartphone OR [email or web address] as below if video visit (depending on what platform provider is using)  6. Inform patient they will receive a phone call 15 minutes prior to their appointment time (may be from unknown caller ID) so they should be prepared to answer    TELEPHONE CALL NOTE  DURK CARMEN has been deemed a candidate for a follow-up tele-health visit to limit community exposure during the Covid-19 pandemic. I spoke with the patient via phone to ensure availability of phone/video source, confirm preferred email & phone number, and discuss instructions and expectations.  I reminded CONAL SHETLEY to be prepared with any vital sign and/or heart rhythm information that could potentially be obtained via home monitoring, at the time of his visit. I reminded TAVEON ENYEART to expect a phone call prior to  his visit.  Orinda Kenner 06/04/2019 1:39 PM

## 2019-06-05 ENCOUNTER — Other Ambulatory Visit: Payer: Self-pay

## 2019-06-05 ENCOUNTER — Ambulatory Visit (INDEPENDENT_AMBULATORY_CARE_PROVIDER_SITE_OTHER): Payer: BLUE CROSS/BLUE SHIELD | Admitting: Family Medicine

## 2019-06-05 DIAGNOSIS — Z13220 Encounter for screening for lipoid disorders: Secondary | ICD-10-CM

## 2019-06-05 DIAGNOSIS — I10 Essential (primary) hypertension: Secondary | ICD-10-CM

## 2019-06-05 DIAGNOSIS — R739 Hyperglycemia, unspecified: Secondary | ICD-10-CM

## 2019-06-05 DIAGNOSIS — Z79899 Other long term (current) drug therapy: Secondary | ICD-10-CM | POA: Diagnosis not present

## 2019-06-05 DIAGNOSIS — R7303 Prediabetes: Secondary | ICD-10-CM

## 2019-06-05 MED ORDER — LOSARTAN POTASSIUM-HCTZ 100-12.5 MG PO TABS: 1.0000 | ORAL_TABLET | Freq: Every day | ORAL | 1 refills | Status: DC

## 2019-06-05 MED ORDER — CARVEDILOL 3.125 MG PO TABS: 3.1250 mg | ORAL_TABLET | Freq: Two times a day (BID) | ORAL | 1 refills | Status: DC

## 2019-06-05 MED ORDER — ATORVASTATIN CALCIUM 80 MG PO TABS: 80.0000 mg | ORAL_TABLET | Freq: Every day | ORAL | 1 refills | Status: DC

## 2019-06-05 NOTE — Progress Notes (Signed)
Subjective:    Patient ID: Jeremy Riley, male    DOB: 05-10-1961, 58 y.o.   MRN: [id number]  HPIHospitalization follow up. Heart catheterization on 05/22/19.  Pt states he is doing well since being home. Pt states he is keeping record of his bp. Highest bp 133/85 and lowest 113/79. Follow up visit tomorrow with cardiologist.  Patient had very serious issue recently and had to have a stent.  He still vapes but states he only does have a few times per day does not use cigarettes.  He is eating much healthier.  Doing a little bit of walking.  Denies any chest tightness pressure pain or shortness of breath.  Relates compliance with his medicines.  He is requesting 90-day prescriptions on his medicines. Pt states no concerns today.   Virtual Visit via Video Note  I connected with Jeremy Riley on 06/05/19 at  2:00 PM EDT by a video enabled telemedicine application and verified that I am speaking with the correct person using two identifiers.  Location: Patient: home Provider: office   I discussed the limitations of evaluation and management by telemedicine and the availability of in person appointments. The patient expressed understanding and agreed to proceed.  History of Present Illness: I did discuss with the patient how his glucose was elevated in the hospital and his A1c is.  He states is mainly because he was eating a lot of snack foods breads and potatoes and also drinking sugary drinks he states he is done much much better and would like to have some time to get this better.   Observations/Objective:   Assessment and Plan:   Follow Up Instructions:    I discussed the assessment and treatment plan with the patient. The patient was provided an opportunity to ask questions and all were answered. The patient agreed with the plan and demonstrated an understanding of the instructions.   The patient was advised to call back or seek an in-person evaluation if the symptoms worsen or  if the condition fails to improve as anticipated. 1 I provided 15 minutes of non-face-to-face time during this encounter.       Review of Systems  Constitutional: Negative for activity change, fatigue and fever.  HENT: Negative for congestion and rhinorrhea.   Respiratory: Negative for cough and shortness of breath.   Cardiovascular: Negative for chest pain and leg swelling.  Gastrointestinal: Negative for abdominal pain, diarrhea and nausea.  Genitourinary: Negative for dysuria and hematuria.  Neurological: Negative for weakness and headaches.  Psychiatric/Behavioral: Negative for agitation and behavioral problems.       Objective:   Physical Exam   Patient had virtual visit Appears to be in no distress Atraumatic Neuro able to relate and oriented No apparent resp distress Color normal      Assessment & Plan:  1. Essential hypertension Blood pressure good control per patient continue current medications.  Follow-up with cardiology as planned  2. Pre-diabetes Prediabetes with A1c of 6.3 patient is doing much much better with dietary measures and activity we will recheck the A1c in 3 months based upon this may or may not have to add medication  3. Lipid screening Very important for the patient to be on and stay on statin to lower his risk for heart disease.  Our goal is to get the LDL below 70 and if possible below 50.  We will repeat lipid profile in approximately 3 months with follow-up office visit - Lipid panel; Future - Lipid  panel  4. High risk medication use Liver profile based upon medications - Hepatic function panel; Future - Basic metabolic panel; Future - Basic metabolic panel - Hepatic function panel  5. Hyperglycemia We will be checking the A1c in approximately 3 months. - Hemoglobin A1c; Future - Hemoglobin A1c  Patient was encouraged to cut back on vaping and eventually quit patient was also encouraged to eat healthy stay physically active and  stay compliant with medicine and follow-up with cardiology as planned  Follow-up here 3 months

## 2019-06-07 ENCOUNTER — Other Ambulatory Visit: Payer: Self-pay

## 2019-06-07 ENCOUNTER — Encounter: Payer: Self-pay | Admitting: Student

## 2019-06-07 ENCOUNTER — Telehealth (INDEPENDENT_AMBULATORY_CARE_PROVIDER_SITE_OTHER): Payer: BLUE CROSS/BLUE SHIELD | Admitting: Student

## 2019-06-07 VITALS — BP 124/82 | Ht 68.0 in | Wt 255.0 lb

## 2019-06-07 DIAGNOSIS — I2510 Atherosclerotic heart disease of native coronary artery without angina pectoris: Secondary | ICD-10-CM | POA: Diagnosis not present

## 2019-06-07 DIAGNOSIS — E785 Hyperlipidemia, unspecified: Secondary | ICD-10-CM

## 2019-06-07 DIAGNOSIS — Z7189 Other specified counseling: Secondary | ICD-10-CM

## 2019-06-07 DIAGNOSIS — Z720 Tobacco use: Secondary | ICD-10-CM | POA: Diagnosis not present

## 2019-06-07 DIAGNOSIS — I10 Essential (primary) hypertension: Secondary | ICD-10-CM | POA: Diagnosis not present

## 2019-06-07 MED ORDER — TICAGRELOR 90 MG PO TABS: 90.0000 mg | ORAL_TABLET | Freq: Two times a day (BID) | ORAL | 3 refills | Status: DC

## 2019-06-07 NOTE — Progress Notes (Signed)
Virtual Visit via Video Note   This visit type was conducted due to national recommendations for restrictions regarding the COVID-19 Pandemic (e.g. social distancing) in an effort to limit this patient's exposure and mitigate transmission in our community.  Due to his co-morbid illnesses, this patient is at least at moderate risk for complications without adequate follow up.  This format is felt to be most appropriate for this patient at this time.  All issues noted in this document were discussed and addressed.  A limited physical exam was performed with this format.  Please refer to the patient's chart for his consent to telehealth for [facility].   Date:  06/07/2019   ID:  Jeremy Riley, DOB [birth year], MRN [id number]  Patient Location: Home Provider Location: Office  PCP:  Kathyrn Drown, MD  Cardiologist:  Rozann Lesches, MD  Electrophysiologist:  None   Evaluation Performed:  Follow-Up Visit  Chief Complaint: Hospital Follow-up  History of Present Illness:    Jeremy Riley is a 58 y.o. male with past medical history of HTN, chronic back pain, family history of CAD and prior tobacco abuse who presents to the office today for hospital follow-up.  He presented to [facility] ED on 05/21/2019 for evaluation of chest pain but left AMA during the early morning hours of 05/22/2019. He later presented back to the ED that morning at the instruction of his PCP. Reported chest tightness for the past week which he described as congestion but also reported associated fatigue and elevated BP. Initial and cyclic and troponin values were negative. Given that his symptoms were concerning for unstable angina, he was sent to [facility] for a cardiac catheterization. This was performed by Dr. Tamala Julian on 05/22/2019 and showed 99% stenosis along the distal LCx this was treated with DES placement. Had residual 50% Prox-LCx stenosis and 30% stenosis along LAD and distal RCA. EF was preserved at 55-65%. He  was started on DAPT with ASA and Brilinta along with 3.125 mg twice daily and Lipitor 80 mg daily.  Amlodipine was discontinued and he was transitioned to Losartan-HCTZ 100-12.5mg  daily. Repeat BMET on 6/18 showed stable renal function with creatinine at 1.11 and K+ at 3.8.  In talking with the patient today, he denies any recurrent chest pain since hospital discharge. Breathing has been at baseline and he denies any dyspnea on exertion, orthopnea, PND, or edema. No recent palpitations, dizziness, or presyncope. Says he initially "felt funny" when he started his medications at discharge but this has improved.   BP has been well-controlled when checked at home, at 124/82 on most recent check.  Reports good compliance with his current medication regimen, including ASA and Brilinta. Denies missing any recent doses.   The patient does not have symptoms concerning for COVID-19 infection (fever, chills, cough, or new shortness of breath).    Past Medical History:  Diagnosis Date   Coronary artery disease    a. LHC 05/22/2019: 99% dCx s/p DES, luminal irregularities in LAD and dominant RCA   History of pneumonia as a child    Hyperlipidemia    Hypertension    Lumbar disc disease    Fall from ladder   Pre-diabetes    Hgb A1c 6.3 in 05/2019.   Past Surgical History:  Procedure Laterality Date   CORONARY STENT INTERVENTION N/A 05/22/2019   Procedure: CORONARY STENT INTERVENTION;  Surgeon: Belva Crome, MD;  Location: [place] [facility];  Service: Cardiovascular;  Laterality: N/A;  LEFT HEART CATH AND CORONARY ANGIOGRAPHY N/A 05/22/2019   Procedure: LEFT HEART CATH AND CORONARY ANGIOGRAPHY;  Surgeon: Belva Crome, MD;  Location: [place] [facility];  Service: Cardiovascular;  Laterality: N/A;     Current Meds  Medication Sig   acetaminophen (TYLENOL) 500 MG tablet Take 500 mg by mouth every 6 (six) hours as needed.   aspirin 81 MG chewable tablet Chew 1 tablet (81 mg total) by mouth  daily.   atorvastatin (LIPITOR) 80 MG tablet Take 1 tablet (80 mg total) by mouth daily at 6 PM.   carvedilol (COREG) 3.125 MG tablet Take 1 tablet (3.125 mg total) by mouth 2 (two) times daily with a meal.   losartan-hydrochlorothiazide (HYZAAR) 100-12.5 MG tablet Take 1 tablet by mouth daily.   nitroGLYCERIN (NITROSTAT) 0.4 MG SL tablet Place 1 tablet (0.4 mg total) under the tongue every 5 (five) minutes x 3 doses as needed for chest pain.   ticagrelor (BRILINTA) 90 MG TABS tablet Take 1 tablet (90 mg total) by mouth 2 (two) times daily.   [DISCONTINUED] ticagrelor (BRILINTA) 90 MG TABS tablet Take 1 tablet (90 mg total) by mouth 2 (two) times daily.     Allergies:   Patient has no known allergies.   Social History   Tobacco Use   Smoking status: Former Smoker    Types: Cigarettes   Smokeless tobacco: Current User    Types: Chew  Substance Use Topics   Alcohol use: Yes    Comment: occ   Drug use: Not Currently     Family Hx: The patient's family history includes Heart disease in his father.  ROS:   Please see the history of present illness.     All other systems reviewed and are negative.   Prior CV studies:   The following studies were reviewed today:  Cardiac Catheterization: 05/22/2019  Unstable angina pectoris  Successful DES implantation in distal circumflex reducing a culprit 99% stenosis to 0% using a 2.5 x 18 Onyx postdilated to 2.75 mm.  TIMI grade III flow noted.  Normal left main.  Luminal irregularities in LAD  Luminal irregularities and dominant right coronary  RECOMMENDATIONS:   Aggressive secondary risk factor modification: Consider the diagnosis of sleep apnea; hemoglobin A1c should be less than 7; LDL cholesterol less than 70 and preferably 55 or less; blood pressure 130/80 mmHg or less; moderate aerobic activity; discontinue vaping.  Eligible for discharge in a.m. if no problems.  Labs/Other Tests and Data Reviewed:    EKG:  An  ECG dated 05/23/2019 was personally reviewed today and demonstrated:  NSR, HR 80, with no acute ST or T-wave changes when compared to prior tracings.   Recent Labs: 05/22/2019: Magnesium 2.3 05/23/2019: Hemoglobin 15.8; Platelets 228 05/30/2019: BUN 20; Creatinine, Ser 1.11; Potassium 3.8; Sodium 139   Recent Lipid Panel Lab Results  Component Value Date/Time   CHOL 220 (H) 05/22/2019 06:06 AM   CHOL 194 09/08/2017 08:51 AM   TRIG 245 (H) 05/22/2019 06:06 AM   HDL 37 (L) 05/22/2019 06:06 AM   HDL 40 09/08/2017 08:51 AM   CHOLHDL 5.9 05/22/2019 06:06 AM   LDLCALC 134 (H) 05/22/2019 06:06 AM   LDLCALC 125 (H) 09/08/2017 08:51 AM    Wt Readings from Last 3 Encounters:  06/07/19 255 lb (115.7 kg)  05/23/19 252 lb 8 oz (114.5 kg)  05/21/19 240 lb (108.9 kg)     Objective:    Vital Signs:  BP 124/82    Ht  5\' 8"  (1.727 m)    Wt 255 lb (115.7 kg)    BMI 38.77 kg/m   General: Pleasant male appearing in NAD Psych: Normal affect. Neuro: Alert and oriented X 3. Moves all extremities spontaneously. HEENT: Normal in appearance by video.   Lungs:  Respirations appear regular and unlabored.   ASSESSMENT & PLAN:    1. CAD - recently admitted for unstable angina and catheterization showed 99% stenosis along the distal LCx this was treated with DES placement. Had residual 50% Prox-LCx stenosis and 30% stenosis along LAD and distal RCA.  - he denies any recent chest pain or dyspnea on exertion.  - will continue on his current medication regimen at this time including ASA, Brilinta, statin, and BB therapy. Compliance with DAPT strongly encouraged.   2. HTN - BP has been well-controlled when checked at home, 124/82 on most recent check. - continue current medication regimen with Coreg 3.125mg  BID and Losartan-HCTZ 100-12.5mg  daily. Repeat BMET last week showed stable renal function and electrolytes.   3. HLD - FLP during recent admission showed total cholesterol 220, triglycerides 245, HDL 37,  and LDL 134. Goal LDL less than 70 with known CAD. Was started on Atorvastatin 80 mg daily during admission.  Will need repeat FLP and LFT's in 6 weeks (already ordered by his PCP by review of Epic).   4. Tobacco Use - he no longer smokes cigarettes but continues to vape. Recommended to continue to reduce the nicotine concentration with cessation advised.   5. COVID-19 Education - The signs and symptoms of COVID-19 were discussed with the patient. The importance of social distancing was discussed today.  Time:   Today, I have spent 26 minutes with the patient with telehealth technology discussing the above problems.     Medication Adjustments/Labs and Tests Ordered: Current medicines are reviewed at length with the patient today.  Concerns regarding medicines are outlined above.   Tests Ordered: No orders of the defined types were placed in this encounter.   Medication Changes: Meds ordered this encounter  Medications   ticagrelor (BRILINTA) 90 MG TABS tablet    Sig: Take 1 tablet (90 mg total) by mouth 2 (two) times daily.    Dispense:  180 tablet    Refill:  3    Order Specific Question:   Supervising Provider    Answer:   Dorothy Spark [[id number]]    Follow Up: In office with Dr. Domenic Polite in 3 months.   Signed, Erma Heritage, PA-C  06/07/2019 2:55 PM    [facility]

## 2019-06-07 NOTE — Patient Instructions (Signed)
Medication Instructions:  Your physician recommends that you continue on your current medications as directed. Please refer to the Current Medication list given to you today.   Labwork: NONE   Testing/Procedures: NONE  Follow-Up: Your physician recommends that you schedule a follow-up appointment in: 3 Months in the [place] office with Dr. Domenic Polite.   Any Other Special Instructions Will Be Listed Below (If Applicable).     If you need a refill on your cardiac medications before your next appointment, please call your pharmacy.  Thank you for choosing [facility]!

## 2019-06-10 ENCOUNTER — Other Ambulatory Visit: Payer: Self-pay | Admitting: Family Medicine

## 2019-06-10 NOTE — Telephone Encounter (Signed)
He does not need to be on potassium tablets

## 2019-06-18 DIAGNOSIS — E119 Type 2 diabetes mellitus without complications: Secondary | ICD-10-CM | POA: Diagnosis not present

## 2019-07-18 ENCOUNTER — Encounter: Payer: Self-pay | Admitting: Family Medicine

## 2019-07-18 ENCOUNTER — Other Ambulatory Visit (HOSPITAL_COMMUNITY)
Admission: RE | Admit: 2019-07-18 | Discharge: 2019-07-18 | Disposition: A | Discharge status: Home / Self Care | Payer: BLUE CROSS/BLUE SHIELD | Source: Ambulatory Visit | Attending: Family Medicine | Admitting: Family Medicine

## 2019-07-18 ENCOUNTER — Other Ambulatory Visit: Payer: Self-pay

## 2019-07-18 DIAGNOSIS — R739 Hyperglycemia, unspecified: Secondary | ICD-10-CM | POA: Diagnosis not present

## 2019-07-18 DIAGNOSIS — Z79899 Other long term (current) drug therapy: Secondary | ICD-10-CM | POA: Diagnosis not present

## 2019-07-18 LAB — HEPATIC FUNCTION PANEL
ALT: 33 U/L (ref 0–44)
AST: 24 U/L (ref 15–41)
Albumin: 4.2 g/dL (ref 3.5–5.0)
Alkaline Phosphatase: 46 U/L (ref 38–126)
Bilirubin, Direct: 0.1 mg/dL (ref 0.0–0.2)
Indirect Bilirubin: 0.6 mg/dL (ref 0.3–0.9)
Total Bilirubin: 0.7 mg/dL (ref 0.3–1.2)
Total Protein: 7.1 g/dL (ref 6.5–8.1)

## 2019-07-18 LAB — BASIC METABOLIC PANEL
Anion gap: 7 (ref 5–15)
BUN: 24 mg/dL — ABNORMAL HIGH (ref 6–20)
CO2: 24 mmol/L (ref 22–32)
Calcium: 8.9 mg/dL (ref 8.9–10.3)
Chloride: 107 mmol/L (ref 98–111)
Creatinine, Ser: 1.11 mg/dL (ref 0.61–1.24)
GFR calc Af Amer: >60 mL/min (ref >60)
GFR calc non Af Amer: >60 mL/min (ref >60)
Glucose, Bld: 203 mg/dL — ABNORMAL HIGH (ref 70–99)
Potassium: 3.7 mmol/L (ref 3.5–5.1)
Sodium: 138 mmol/L (ref 135–145)

## 2019-07-18 LAB — HEMOGLOBIN A1C
Hgb A1c MFr Bld: 5.9 % — ABNORMAL HIGH (ref 4.8–5.6)
Mean Plasma Glucose: 122.63 mg/dL

## 2019-07-18 LAB — LIPID PANEL
Cholesterol: 95 mg/dL (ref 0–200)
HDL: 30 mg/dL — ABNORMAL LOW (ref >40)
LDL Cholesterol: 43 mg/dL (ref 0–99)
Total CHOL/HDL Ratio: 3.2 RATIO
Triglycerides: 109 mg/dL (ref <150)
VLDL: 22 mg/dL (ref 0–40)

## 2019-07-22 ENCOUNTER — Telehealth: Payer: Self-pay | Admitting: Family Medicine

## 2019-07-22 MED ORDER — AMOXICILLIN 500 MG PO CAPS: ORAL_CAPSULE | ORAL | 0 refills | Status: DC

## 2019-07-22 NOTE — Telephone Encounter (Signed)
Amoxicillin 500 mg 1 3 times daily for 7 days if ongoing troubles I recommend follow-up office visit

## 2019-07-22 NOTE — Telephone Encounter (Signed)
Pt is having Right ear pain started Friday. He had two amoxicillins and took those and it eased up some but he would like to know if more could be called in.   [id number] - [place], [place] - [place]

## 2019-07-22 NOTE — Telephone Encounter (Signed)
Please advise. Thank you

## 2019-07-22 NOTE — Telephone Encounter (Signed)
Medication sent to pharmacy and pt is aware 

## 2019-08-19 ENCOUNTER — Observation Stay (HOSPITAL_COMMUNITY)
Admission: EM | Admit: 2019-08-19 | Discharge: 2019-08-20 | Disposition: A | Discharge status: Home / Self Care | Payer: BLUE CROSS/BLUE SHIELD | Attending: Family Medicine | Admitting: Family Medicine

## 2019-08-19 ENCOUNTER — Emergency Department (HOSPITAL_COMMUNITY): Payer: BLUE CROSS/BLUE SHIELD

## 2019-08-19 ENCOUNTER — Other Ambulatory Visit: Payer: Self-pay

## 2019-08-19 ENCOUNTER — Encounter (HOSPITAL_COMMUNITY): Payer: Self-pay | Admitting: Emergency Medicine

## 2019-08-19 DIAGNOSIS — I2510 Atherosclerotic heart disease of native coronary artery without angina pectoris: Secondary | ICD-10-CM | POA: Diagnosis not present

## 2019-08-19 DIAGNOSIS — Z7982 Long term (current) use of aspirin: Secondary | ICD-10-CM | POA: Insufficient documentation

## 2019-08-19 DIAGNOSIS — Z20828 Contact with and (suspected) exposure to other viral communicable diseases: Secondary | ICD-10-CM | POA: Insufficient documentation

## 2019-08-19 DIAGNOSIS — I10 Essential (primary) hypertension: Secondary | ICD-10-CM | POA: Insufficient documentation

## 2019-08-19 DIAGNOSIS — R079 Chest pain, unspecified: Secondary | ICD-10-CM | POA: Diagnosis present

## 2019-08-19 DIAGNOSIS — Z87891 Personal history of nicotine dependence: Secondary | ICD-10-CM | POA: Diagnosis not present

## 2019-08-19 DIAGNOSIS — Z79899 Other long term (current) drug therapy: Secondary | ICD-10-CM | POA: Insufficient documentation

## 2019-08-19 DIAGNOSIS — R0789 Other chest pain: Principal | ICD-10-CM | POA: Insufficient documentation

## 2019-08-19 LAB — TROPONIN I (HIGH SENSITIVITY): Troponin I (High Sensitivity): 2 ng/L (ref <18)

## 2019-08-19 LAB — CBC
HCT: 44.5 % (ref 39.0–52.0)
Hemoglobin: 15.3 g/dL (ref 13.0–17.0)
MCH: 31 pg (ref 26.0–34.0)
MCHC: 34.4 g/dL (ref 30.0–36.0)
MCV: 90.1 fL (ref 80.0–100.0)
Platelets: 244 10*3/uL (ref 150–400)
RBC: 4.94 MIL/uL (ref 4.22–5.81)
RDW: 12.6 % (ref 11.5–15.5)
WBC: 11.6 10*3/uL — ABNORMAL HIGH (ref 4.0–10.5)
nRBC: 0 % (ref 0.0–0.2)

## 2019-08-19 LAB — BASIC METABOLIC PANEL
Anion gap: 12 (ref 5–15)
BUN: 19 mg/dL (ref 6–20)
CO2: 21 mmol/L — ABNORMAL LOW (ref 22–32)
Calcium: 9.4 mg/dL (ref 8.9–10.3)
Chloride: 106 mmol/L (ref 98–111)
Creatinine, Ser: 1.09 mg/dL (ref 0.61–1.24)
GFR calc Af Amer: >60 mL/min (ref >60)
GFR calc non Af Amer: >60 mL/min (ref >60)
Glucose, Bld: 134 mg/dL — ABNORMAL HIGH (ref 70–99)
Potassium: 3.6 mmol/L (ref 3.5–5.1)
Sodium: 139 mmol/L (ref 135–145)

## 2019-08-19 MED ORDER — NITROGLYCERIN 0.4 MG SL SUBL
0.4000 mg | SUBLINGUAL_TABLET | SUBLINGUAL | Status: DC | PRN
  Administered 2019-08-20: 01:00:00 0.4 mg via SUBLINGUAL
  Filled 2019-08-19: qty 1

## 2019-08-19 MED ORDER — SODIUM CHLORIDE 0.9% FLUSH: 3.0000 mL | Freq: Once | INTRAVENOUS | Status: DC

## 2019-08-19 NOTE — ED Provider Notes (Signed)
[facility] EMERGENCY DEPARTMENT Provider Note   CSN: [id number] Arrival date & time: 08/19/19  2117     History   Chief Complaint Chief Complaint  Patient presents with  . Chest Pain    HPI Jeremy Riley is a 58 y.o. male with a history of CAD with DES 05/22/2019 99% stenosis of the distal LCx, 50% prox LCx and 30% along the LAD on aspirin and Brilinta, presenting with right sided chest pressure starting around 17:30 tonight.  He reports playing 18 holes of golf this am without sx, then sx started this evening while at rest.  He denies n/v/ palpitations or sob with the pain episode which is greatly improved at this time but not resolved.  The pressure sx is similar to sx experienced prior to his stent placement in June but the location is different, being right sided.  He has taken no medicines for his sx prior to arrival but has been compliant with his prescribed medicines.    HPI  Past Medical History:  Diagnosis Date  . Coronary artery disease    a. LHC 05/22/2019: 99% dCx s/p DES, luminal irregularities in LAD and dominant RCA  . History of pneumonia as a child   . Hyperlipidemia   . Hypertension   . Lumbar disc disease    Fall from ladder  . Pre-diabetes    Hgb A1c 6.3 in 05/2019.    Patient Active Problem List   Diagnosis Date Noted  . Hyperlipidemia 05/23/2019  . Pre-diabetes 05/23/2019  . CAD (coronary artery disease) 05/23/2019  . Unstable angina ([place])   . Chest pain 05/21/2019  . Hypertensive urgency 05/21/2019  . Hypokalemia 05/21/2019  . HTN (hypertension) 10/03/2017    Past Surgical History:  Procedure Laterality Date  . CORONARY STENT INTERVENTION N/A 05/22/2019   Procedure: CORONARY STENT INTERVENTION;  Surgeon: Belva Crome, MD;  Location: [place] [facility];  Service: Cardiovascular;  Laterality: N/A;  . LEFT HEART CATH AND CORONARY ANGIOGRAPHY N/A 05/22/2019   Procedure: LEFT HEART CATH AND CORONARY ANGIOGRAPHY;  Surgeon: Belva Crome, MD;   Location: [place] [facility];  Service: Cardiovascular;  Laterality: N/A;        Home Medications    Prior to Admission medications   Medication Sig Start Date End Date Taking? Authorizing Provider  acetaminophen (TYLENOL) 500 MG tablet Take 500 mg by mouth every 6 (six) hours as needed.    [provider]  amoxicillin (AMOXIL) 500 MG capsule Take one capsule po TID for 7 days 07/22/19   Kathyrn Drown, MD  aspirin 81 MG chewable tablet Chew 1 tablet (81 mg total) by mouth daily. 05/24/19   Darreld Mclean, PA-C  atorvastatin (LIPITOR) 80 MG tablet Take 1 tablet (80 mg total) by mouth daily at 6 PM. 06/05/19   Wolfgang Phoenix, Elayne Snare, MD  carvedilol (COREG) 3.125 MG tablet Take 1 tablet (3.125 mg total) by mouth 2 (two) times daily with a meal. 06/05/19   Luking, Elayne Snare, MD  losartan-hydrochlorothiazide (HYZAAR) 100-12.5 MG tablet Take 1 tablet by mouth daily. 06/05/19   Kathyrn Drown, MD  nitroGLYCERIN (NITROSTAT) 0.4 MG SL tablet Place 1 tablet (0.4 mg total) under the tongue every 5 (five) minutes x 3 doses as needed for chest pain. 05/23/19   Darreld Mclean, PA-C  ticagrelor (BRILINTA) 90 MG TABS tablet Take 1 tablet (90 mg total) by mouth 2 (two) times daily. 06/07/19   Erma Heritage, PA-C  Family History Family History  Problem Relation Age of Onset  . Heart disease Father        CABG age 7's    Social History Social History   Tobacco Use  . Smoking status: Former Smoker    Types: Cigarettes  . Smokeless tobacco: Current User    Types: Chew  Substance Use Topics  . Alcohol use: Yes    Comment: occ  . Drug use: Not Currently     Allergies   Patient has no known allergies.   Review of Systems Review of Systems  Constitutional: Negative for diaphoresis and fever.  HENT: Negative for congestion.   Eyes: Negative.   Respiratory: Negative for chest tightness and shortness of breath.   Cardiovascular: Positive for chest pain.  Gastrointestinal:  Negative for abdominal pain, nausea and vomiting.  Genitourinary: Negative.   Musculoskeletal: Negative for arthralgias, joint swelling and neck pain.  Skin: Negative.  Negative for rash and wound.  Neurological: Negative for dizziness, weakness, light-headedness, numbness and headaches.  Psychiatric/Behavioral: Negative.      Physical Exam Updated Vital Signs BP (!) 142/95   Pulse 73   Temp 97.8 F (36.6 C) (Oral)   Resp (!) 21   Wt 106.6 kg   SpO2 94%   BMI 35.73 kg/m   Physical Exam Vitals signs and nursing note reviewed.  Constitutional:      Appearance: He is well-developed.  HENT:     Head: Normocephalic and atraumatic.  Eyes:     Conjunctiva/sclera: Conjunctivae normal.  Neck:     Musculoskeletal: Normal range of motion.  Cardiovascular:     Rate and Rhythm: Normal rate and regular rhythm.     Heart sounds: Normal heart sounds.  Pulmonary:     Effort: Pulmonary effort is normal.     Breath sounds: Normal breath sounds. No wheezing.  Abdominal:     General: Bowel sounds are normal.     Palpations: Abdomen is soft.     Tenderness: There is no abdominal tenderness.  Musculoskeletal: Normal range of motion.  Skin:    General: Skin is warm and dry.  Neurological:     Mental Status: He is alert.      ED Treatments / Results  Labs (all labs ordered are listed, but only abnormal results are displayed) Labs Reviewed  BASIC METABOLIC PANEL - Abnormal; Notable for the following components:      Result Value   CO2 21 (*)    Glucose, Bld 134 (*)    All other components within normal limits  CBC - Abnormal; Notable for the following components:   WBC 11.6 (*)    All other components within normal limits  SARS CORONAVIRUS 2 (HOSPITAL ORDER, [place] [facility])  TROPONIN I (HIGH SENSITIVITY)  TROPONIN I (HIGH SENSITIVITY)    EKG EKG Interpretation  Date/Time:  Monday August 19 2019 21:42:33 EDT Ventricular Rate:  82 PR Interval:     QRS Duration: 98 QT Interval:  380 QTC Calculation: 444 R Axis:   49 Text Interpretation:  Sinus rhythm No significant change was found Confirmed by Ezequiel Essex [phone]) on 08/20/2019 12:02:26 AM   Radiology Dg Chest 2 View  Result Date: 08/19/2019 CLINICAL DATA:  Chest pain EXAM: CHEST - 2 VIEW COMPARISON:  05/21/2019 FINDINGS: Heart and mediastinal contours are within normal limits. No focal opacities or effusions. No acute bony abnormality. IMPRESSION: No active cardiopulmonary disease. Electronically Signed   By: Rolm Baptise M.D.  On: 08/19/2019 22:48    Procedures Procedures (including critical care time)  Medications Ordered in ED Medications  sodium chloride flush (NS) 0.9 % injection 3 mL (3 mLs Intravenous Not Given 08/19/19 2203)  nitroGLYCERIN (NITROSTAT) SL tablet 0.4 mg (0.4 mg Sublingual Given 08/20/19 0108)     Initial Impression / Assessment and Plan / ED Course  I have reviewed the triage vital signs and the nursing notes.  Pertinent labs & imaging results that were available during my care of the patient were reviewed by me and considered in my medical decision making (see chart for details).        Pt with right sided chest pain with history of unstable angina with stent placement 05/22/19.  Delta troponins negative, pt with heart score of 4.  With multiple risk factors and recent stenting, pt would benefit from overnight observation and is agreeable to this.    Pt seen by Dr. Wyvonnia Dusky.  Discussed with Dr. Clearence Ped who accepts pt for admission.  Final Clinical Impressions(s) / ED Diagnoses   Final diagnoses:  Chest pain, unspecified type    ED Discharge Orders    None       Landis Martins 08/20/19 0121    Ezequiel Essex, MD 08/20/19 [phone]

## 2019-08-19 NOTE — ED Triage Notes (Signed)
Pt C/O chest pain that began around 1730-1800. Pt stating the pain is constant. Pt had recent stent placement.

## 2019-08-20 DIAGNOSIS — R079 Chest pain, unspecified: Secondary | ICD-10-CM

## 2019-08-20 LAB — BASIC METABOLIC PANEL
Anion gap: 9 (ref 5–15)
BUN: 17 mg/dL (ref 6–20)
CO2: 27 mmol/L (ref 22–32)
Calcium: 9.3 mg/dL (ref 8.9–10.3)
Chloride: 106 mmol/L (ref 98–111)
Creatinine, Ser: 1.05 mg/dL (ref 0.61–1.24)
GFR calc Af Amer: >60 mL/min (ref >60)
GFR calc non Af Amer: >60 mL/min (ref >60)
Glucose, Bld: 130 mg/dL — ABNORMAL HIGH (ref 70–99)
Potassium: 4.4 mmol/L (ref 3.5–5.1)
Sodium: 142 mmol/L (ref 135–145)

## 2019-08-20 LAB — CBC
HCT: 44.5 % (ref 39.0–52.0)
Hemoglobin: 14.8 g/dL (ref 13.0–17.0)
MCH: 30.5 pg (ref 26.0–34.0)
MCHC: 33.3 g/dL (ref 30.0–36.0)
MCV: 91.6 fL (ref 80.0–100.0)
Platelets: 235 10*3/uL (ref 150–400)
RBC: 4.86 MIL/uL (ref 4.22–5.81)
RDW: 12.7 % (ref 11.5–15.5)
WBC: 9.6 10*3/uL (ref 4.0–10.5)
nRBC: 0 % (ref 0.0–0.2)

## 2019-08-20 LAB — MAGNESIUM: Magnesium: 2.2 mg/dL (ref 1.7–2.4)

## 2019-08-20 LAB — TROPONIN I (HIGH SENSITIVITY)
Troponin I (High Sensitivity): 2 ng/L (ref <18)
Troponin I (High Sensitivity): <2 ng/L (ref <18)

## 2019-08-20 LAB — SARS CORONAVIRUS 2 BY RT PCR (HOSPITAL ORDER, PERFORMED IN ~~LOC~~ HOSPITAL LAB): SARS Coronavirus 2: NEGATIVE

## 2019-08-20 MED ORDER — NITROGLYCERIN 0.4 MG SL SUBL: 0.4000 mg | SUBLINGUAL_TABLET | SUBLINGUAL | Status: DC | PRN

## 2019-08-20 MED ORDER — LOSARTAN POTASSIUM-HCTZ 100-12.5 MG PO TABS: 1.0000 | ORAL_TABLET | Freq: Every day | ORAL | Status: DC

## 2019-08-20 MED ORDER — ATORVASTATIN CALCIUM 40 MG PO TABS: 80.0000 mg | ORAL_TABLET | Freq: Every day | ORAL | Status: DC

## 2019-08-20 MED ORDER — ACETAMINOPHEN 325 MG PO TABS: 650.0000 mg | ORAL_TABLET | ORAL | Status: DC | PRN

## 2019-08-20 MED ORDER — ENOXAPARIN SODIUM 60 MG/0.6ML ~~LOC~~ SOLN
0.5000 mg/kg | SUBCUTANEOUS | Status: DC
  Administered 2019-08-20: 06:00:00 55 mg via SUBCUTANEOUS
  Filled 2019-08-20: qty 0.6

## 2019-08-20 MED ORDER — LOSARTAN POTASSIUM 50 MG PO TABS
100.0000 mg | ORAL_TABLET | Freq: Every day | ORAL | Status: DC
  Administered 2019-08-20: 09:00:00 100 mg via ORAL
  Filled 2019-08-20: qty 2

## 2019-08-20 MED ORDER — ASPIRIN 81 MG PO CHEW
81.0000 mg | CHEWABLE_TABLET | Freq: Every day | ORAL | Status: DC
  Administered 2019-08-20: 09:00:00 81 mg via ORAL
  Filled 2019-08-20: qty 1

## 2019-08-20 MED ORDER — CARVEDILOL 3.125 MG PO TABS
3.1250 mg | ORAL_TABLET | Freq: Two times a day (BID) | ORAL | Status: DC
  Administered 2019-08-20: 09:00:00 3.125 mg via ORAL
  Filled 2019-08-20: qty 1

## 2019-08-20 MED ORDER — TICAGRELOR 90 MG PO TABS
90.0000 mg | ORAL_TABLET | Freq: Two times a day (BID) | ORAL | Status: DC
  Administered 2019-08-20: 90 mg via ORAL
  Filled 2019-08-20: qty 1

## 2019-08-20 MED ORDER — HYDROCHLOROTHIAZIDE 12.5 MG PO CAPS
12.5000 mg | ORAL_CAPSULE | Freq: Every day | ORAL | Status: DC
  Administered 2019-08-20: 09:00:00 12.5 mg via ORAL
  Filled 2019-08-20: qty 1

## 2019-08-20 MED ORDER — ONDANSETRON HCL 4 MG/2ML IJ SOLN: 4.0000 mg | Freq: Four times a day (QID) | INTRAMUSCULAR | Status: DC | PRN

## 2019-08-20 NOTE — H&P (Signed)
TRH H&P    Patient Demographics:    Jeremy Riley, is a 58 y.o. male  MRN: [id number]  DOB - 12-Nov-1961  Admit Date - 08/19/2019  Referring MD/NP/PA: PA Idol  Outpatient Primary MD for the patient is Kathyrn Drown, MD  Patient coming from: Home  Chief complaint-chest pain   HPI:    Jeremy Riley  is a 58 y.o. male, with history of CAD status post DES stent placed in June 2020, and hypertension, presents to the ED today with right-sided chest pain.  Patient reports that chest pain started approximately 8 hours ago.  Patient was not doing anything particular when the chest pain started.  Patient did play 18 holes of golf today, which is relatively normal for him, and had no chest pain at that time.  He reports that this pain is different than the pain that he had when his stent was placed in June.  Flexion/rotation of his neck towards the right makes the pain worse.  Nothing makes the pain better.  Nitro given in the ED does not make the pain better.  The pain does not radiate.  The pain has been constant.  He has associated elevated blood pressure.  Patient and wife report that they check his blood pressure daily, and normal at home has been 119 over 70s to 80s.  Today his blood pressure was 145/90, and that worried him in combination with his chest pain, so they came in to have his "heart checked out."  Patient denies dyspnea and diaphoresis as associated symptoms.  Patient reports 1 missed dose of Brilinta-it was a morning dose last week.  He has not missed any blood pressure doses either.  At the time patient's stent was placed, he had unstable angina -he was not diagnosed with a NSTEMI.  Patient does have high blood pressure and high cholesterol and is overweight.  He is not diabetic and he does not smoke.  ED course In the ED patient was given nitroglycerin.  COVID test has been collected and is pending.  Patient  denies any known exposure to COVID.  Patient was started on cardiac monitoring.  An EKG was done that showed normal sinus rhythm at a rate of 82 with no ST changes.  Chest x-ray was done that showed no acute cardiopulmonary disease.  Blood work was done that showed a leukocytosis and a hyperglycemia.  Troponins were trended they were stable x2.  Patient is being brought in for observation due to multiple risk factors for ACS.   Review of systems:    In addition to the HPI above,  No Fever-chills, No Headache, No changes with Vision or hearing, No problems swallowing food or Liquids, Endorses chest pain, no cough or Shortness of Breath, No Abdominal pain, No Nausea or Vomiting, bowel movements are regular, No Blood in stool or Urine, No dysuria, No new skin rashes or bruises, No new joints pains-aches,  No new weakness, tingling, numbness in any extremity, No recent weight gain or loss, No polyuria, polydypsia or polyphagia, No significant  Mental Stressors.     Past History of the following :    Past Medical History:  Diagnosis Date  . Coronary artery disease    a. LHC 05/22/2019: 99% dCx s/p DES, luminal irregularities in LAD and dominant RCA  . History of pneumonia as a child   . Hyperlipidemia   . Hypertension   . Lumbar disc disease    Fall from ladder  . Pre-diabetes    Hgb A1c 6.3 in 05/2019.      Past Surgical History:  Procedure Laterality Date  . CORONARY STENT INTERVENTION N/A 05/22/2019   Procedure: CORONARY STENT INTERVENTION;  Surgeon: Belva Crome, MD;  Location: [facility];  Service: Cardiovascular;  Laterality: N/A;  . LEFT HEART CATH AND CORONARY ANGIOGRAPHY N/A 05/22/2019   Procedure: LEFT HEART CATH AND CORONARY ANGIOGRAPHY;  Surgeon: Belva Crome, MD;  Location: [facility];  Service: Cardiovascular;  Laterality: N/A;      Social History:      Social History   Tobacco Use  . Smoking status: Former Smoker    Types: Cigarettes  .  Smokeless tobacco: Current User    Types: Chew  Substance Use Topics  . Alcohol use: Yes    Comment: occ       Family History :     Family History  Problem Relation Age of Onset  . Heart disease Father        CABG age 38's      Home Medications:   Prior to Admission medications   Medication Sig Start Date End Date Taking? Authorizing Provider  aspirin 81 MG chewable tablet Chew 1 tablet (81 mg total) by mouth daily. 05/24/19  Yes Sande Rives E, PA-C  atorvastatin (LIPITOR) 80 MG tablet Take 1 tablet (80 mg total) by mouth daily at 6 PM. 06/05/19  Yes Luking, Elayne Snare, MD  carvedilol (COREG) 3.125 MG tablet Take 1 tablet (3.125 mg total) by mouth 2 (two) times daily with a meal. 06/05/19  Yes Luking, Elayne Snare, MD  losartan-hydrochlorothiazide (HYZAAR) 100-12.5 MG tablet Take 1 tablet by mouth daily. 06/05/19  Yes Luking, Elayne Snare, MD  nitroGLYCERIN (NITROSTAT) 0.4 MG SL tablet Place 1 tablet (0.4 mg total) under the tongue every 5 (five) minutes x 3 doses as needed for chest pain. 05/23/19  Yes Sande Rives E, PA-C  ticagrelor (BRILINTA) 90 MG TABS tablet Take 1 tablet (90 mg total) by mouth 2 (two) times daily. 06/07/19  Yes Strader, [place] M, PA-C  acetaminophen (TYLENOL) 500 MG tablet Take 500 mg by mouth every 6 (six) hours as needed.    [provider]  amoxicillin (AMOXIL) 500 MG capsule Take one capsule po TID for 7 days 07/22/19   Kathyrn Drown, MD     Allergies:    No Known Allergies   Physical Exam:   Vitals  Blood pressure (!) 149/90, pulse 70, temperature 97.8 F (36.6 C), temperature source Oral, resp. rate 14, weight 106.6 kg, SpO2 96 %.  1.  General: Patient lying supine in bed with wife at bedside in no acute distress  2. Psychiatric: Alert and oriented x3, cooperative, pleasant  3. Neurologic: Cranial nerves II through XII are grossly intact, no focal deficits on limited exam  4. HEENMT:  Head is atraumatic normocephalic, pupils are  reactive to light, neck is not tender to palpation, trachea is midline  5. Respiratory : Lungs are clear to auscultation bilaterally  6. Cardiovascular : H RRR  7. Gastrointestinal:  Abdomen is soft, nondistended, nontender to palpation  8. Skin:  No new rashes or lesions on limited skin exam  9.Musculoskeletal:  Right-sided chest pain is re-created with palpation of the right chest at the midclavicular line ribs 2 through 5.  Pain is also re-created with activation of pectoralis muscles.  Pain is also re-created with activation of the anterior middle and the muscles.    Data Review:    CBC Recent Labs  Lab 08/19/19 2145  WBC 11.6*  HGB 15.3  HCT 44.5  PLT 244  MCV 90.1  MCH 31.0  MCHC 34.4  RDW 12.6   ------------------------------------------------------------------------------------------------------------------  Results for orders placed or performed during the hospital encounter of 08/19/19 (from the past 48 hour(s))  Basic metabolic panel     Status: Abnormal   Collection Time: 08/19/19  9:45 PM  Result Value Ref Range   Sodium 139 135 - 145 mmol/L   Potassium 3.6 3.5 - 5.1 mmol/L   Chloride 106 98 - 111 mmol/L   CO2 21 (L) 22 - 32 mmol/L   Glucose, Bld 134 (H) 70 - 99 mg/dL   BUN 19 6 - 20 mg/dL   Creatinine, Ser 1.09 0.61 - 1.24 mg/dL   Calcium 9.4 8.9 - 10.3 mg/dL   GFR calc non Af Amer >60 >60 mL/min   GFR calc Af Amer >60 >60 mL/min   Anion gap 12 5 - 15    Comment: Performed at [facility], [address], [place], [place] [address]  CBC     Status: Abnormal   Collection Time: 08/19/19  9:45 PM  Result Value Ref Range   WBC 11.6 (H) 4.0 - 10.5 K/uL   RBC 4.94 4.22 - 5.81 MIL/uL   Hemoglobin 15.3 13.0 - 17.0 g/dL   HCT 44.5 39.0 - 52.0 %   MCV 90.1 80.0 - 100.0 fL   MCH 31.0 26.0 - 34.0 pg   MCHC 34.4 30.0 - 36.0 g/dL   RDW 12.6 11.5 - 15.5 %   Platelets 244 150 - 400 K/uL   nRBC 0.0 0.0 - 0.2 %    Comment: Performed at [facility],  [address], [place], [place] [address]  Troponin I (High Sensitivity)     Status: None   Collection Time: 08/19/19  9:45 PM  Result Value Ref Range   Troponin I (High Sensitivity) 2 <18 ng/L    Comment: (NOTE) Elevated high sensitivity troponin I (hsTnI) values and significant  changes across serial measurements may suggest ACS but many other  chronic and acute conditions are known to elevate hsTnI results.  Refer to the "Links" section for chest pain algorithms and additional  guidance. Performed at [facility], [address] [place], [place] [address]   Troponin I (High Sensitivity)     Status: None   Collection Time: 08/19/19 11:56 PM  Result Value Ref Range   Troponin I (High Sensitivity) <2.0 <18 ng/L    Comment: Performed at [facility], [address], [place], [place] [address]    Chemistries  Recent Labs  Lab 08/19/19 2145  NA 139  K 3.6  CL 106  CO2 21*  GLUCOSE 134*  BUN 19  CREATININE 1.09  CALCIUM 9.4   ------------------------------------------------------------------------------------------------------------------  ------------------------------------------------------------------------------------------------------------------ GFR: Estimated Creatinine Clearance: 87.5 mL/min (by C-G formula based on SCr of 1.09 mg/dL). Liver Function Tests: No results for input(s): AST, ALT, ALKPHOS, BILITOT, PROT, ALBUMIN in the last 168 hours. No results for input(s): LIPASE,  AMYLASE in the last 168 hours. No results for input(s): AMMONIA in the last 168 hours. Coagulation Profile: No results for input(s): INR, PROTIME in the last 168 hours. Cardiac Enzymes: No results for input(s): CKTOTAL, CKMB, CKMBINDEX, TROPONINI in the last 168 hours. BNP (last 3 results) No results for input(s): PROBNP in the last 8760 hours. HbA1C: No results for input(s): HGBA1C in the last 72 hours. CBG: No results for input(s): GLUCAP in the last 168 hours. Lipid Profile: No results for  input(s): CHOL, HDL, LDLCALC, TRIG, CHOLHDL, LDLDIRECT in the last 72 hours. Thyroid Function Tests: No results for input(s): TSH, T4TOTAL, FREET4, T3FREE, THYROIDAB in the last 72 hours. Anemia Panel: No results for input(s): VITAMINB12, FOLATE, FERRITIN, TIBC, IRON, RETICCTPCT in the last 72 hours.  --------------------------------------------------------------------------------------------------------------- Urine analysis:    Component Value Date/Time   COLORURINE YELLOW 05/21/2008 0638   APPEARANCEUR CLEAR 05/21/2008 0638   LABSPEC >1.030 (H) 05/21/2008 0638   PHURINE 5.0 05/21/2008 0638   GLUCOSEU NEGATIVE 05/21/2008 0638   HGBUR LARGE (A) 05/21/2008 0638   BILIRUBINUR NEGATIVE 05/21/2008 0638   KETONESUR NEGATIVE 05/21/2008 0638   PROTEINUR NEGATIVE 05/21/2008 0638   UROBILINOGEN 0.2 05/21/2008 0638   NITRITE NEGATIVE 05/21/2008 0638   LEUKOCYTESUR Negative 02/27/2018 1622      Imaging Results:    Dg Chest 2 View  Result Date: 08/19/2019 CLINICAL DATA:  Chest pain EXAM: CHEST - 2 VIEW COMPARISON:  05/21/2019 FINDINGS: Heart and mediastinal contours are within normal limits. No focal opacities or effusions. No acute bony abnormality. IMPRESSION: No active cardiopulmonary disease. Electronically Signed   By: Rolm Baptise M.D.   On: 08/19/2019 22:48    My personal review of EKG: Rhythm NSR, Rate 82 /min, QTc 444 ,no Acute ST changes   Assessment & Plan:    Active Problems:   Chest pain   1. Chest pain 1. TIMI score of 3 indicating 13% risk at 14 days of all cause mortality 2. Chest pain is reproducible, nonexertional, does not improve with nitro, is not associated with dyspnea or diaphoresis, stable troponin x2- this pain is likely musculoskeletal 3. Given patient's recent history of stent placement with drug-eluting stent, hypertension, hypercholesterolemia- we will bring patient in for observation on telemetry. 4. Continue to monitor on telemetry 5. Patient is  medically optimized on aspirin, carvedilol, atorvastatin, losartan continue these medications 2. Leukocytosis 1. Very minimal leukocytosis likely insignificant 2. Continue to monitor with daily CBC 3. Hyperglycemia 1. Continue to monitor with daily BMP 2. Last hemoglobin A1c was July 18 2019-5.9% 4. Hypertension 1. Continue home medications 2. Continue to monitor vitals per floor protocol    DVT Prophylaxis-   Lovenox - SCDs   AM Labs Ordered, also please review Full Orders  Family Communication: Admission, patients condition and plan of care including tests being ordered have been discussed with the patient and wife who indicate understanding and agree with the plan and Code Status.  Code Status: Full  Admission status: Observation: Based on patients clinical presentation and evaluation of above clinical data, I have made determination that patient meets observation criteria at this time Time spent in minutes : 62   Yvetta Drotar B Zierle-Ghosh M.D on 08/20/2019 at 1:40 AM

## 2019-08-20 NOTE — ED Notes (Signed)
ED TO INPATIENT HANDOFF REPORT  ED Nurse Name and Phone #: [phone] [name] Name/Age/Gender Jeremy Riley 58 y.o. male Room/Bed: APA14/APA14  Code Status   Code Status: Prior  Home/SNF/Other Home Patient oriented to: self, place, time and situation Is this baseline? Yes   Triage Complete: Triage complete  Chief Complaint Chest pressure and pain ( post stint )   Triage Note Pt C/O chest pain that began around 1730-1800. Pt stating the pain is constant. Pt had recent stent placement.   Allergies No Known Allergies  Level of Care/Admitting Diagnosis ED Disposition    ED Disposition Condition [facility] Area: [facility] [id number]  Level of Care: Telemetry [5]  Covid Evaluation: Asymptomatic Screening Protocol (No Symptoms)  Diagnosis: Chest pain [id number]  Admitting Physician: Rolla Plate [id number]  Attending Physician: Rolla Plate [id number]  PT Class (Do Not Modify): Observation [104]  PT Acc Code (Do Not Modify): Observation [10022]       B Medical/Surgery History Past Medical History:  Diagnosis Date  . Coronary artery disease    a. LHC 05/22/2019: 99% dCx s/p DES, luminal irregularities in LAD and dominant RCA  . History of pneumonia as a child   . Hyperlipidemia   . Hypertension   . Lumbar disc disease    Fall from ladder  . Pre-diabetes    Hgb A1c 6.3 in 05/2019.   Past Surgical History:  Procedure Laterality Date  . CORONARY STENT INTERVENTION N/A 05/22/2019   Procedure: CORONARY STENT INTERVENTION;  Surgeon: Belva Crome, MD;  Location: [facility];  Service: Cardiovascular;  Laterality: N/A;  . LEFT HEART CATH AND CORONARY ANGIOGRAPHY N/A 05/22/2019   Procedure: LEFT HEART CATH AND CORONARY ANGIOGRAPHY;  Surgeon: Belva Crome, MD;  Location: [facility];  Service: Cardiovascular;  Laterality: N/A;     A IV Location/Drains/Wounds Patient Lines/Drains/Airways Status   Active  Line/Drains/Airways    Name:   Placement date:   Placement time:   Site:   Days:   Peripheral IV 08/20/19 Left Antecubital   08/20/19    0137    Antecubital   less than 1          Intake/Output Last 24 hours No intake or output data in the 24 hours ending 08/20/19 0257  Labs/Imaging Results for orders placed or performed during the hospital encounter of 08/19/19 (from the past 48 hour(s))  Basic metabolic panel     Status: Abnormal   Collection Time: 08/19/19  9:45 PM  Result Value Ref Range   Sodium 139 135 - 145 mmol/L   Potassium 3.6 3.5 - 5.1 mmol/L   Chloride 106 98 - 111 mmol/L   CO2 21 (L) 22 - 32 mmol/L   Glucose, Bld 134 (H) 70 - 99 mg/dL   BUN 19 6 - 20 mg/dL   Creatinine, Ser 1.09 0.61 - 1.24 mg/dL   Calcium 9.4 8.9 - 10.3 mg/dL   GFR calc non Af Amer >60 >60 mL/min   GFR calc Af Amer >60 >60 mL/min   Anion gap 12 5 - 15    Comment: Performed at [facility], [address], [place], [place] [address]  CBC     Status: Abnormal   Collection Time: 08/19/19  9:45 PM  Result Value Ref Range   WBC 11.6 (H) 4.0 - 10.5 K/uL   RBC 4.94 4.22 - 5.81 MIL/uL   Hemoglobin 15.3 13.0 - 17.0 g/dL  HCT 44.5 39.0 - 52.0 %   MCV 90.1 80.0 - 100.0 fL   MCH 31.0 26.0 - 34.0 pg   MCHC 34.4 30.0 - 36.0 g/dL   RDW 12.6 11.5 - 15.5 %   Platelets 244 150 - 400 K/uL   nRBC 0.0 0.0 - 0.2 %    Comment: Performed at [facility], [address] [place], [place] [address]  Troponin I (High Sensitivity)     Status: None   Collection Time: 08/19/19  9:45 PM  Result Value Ref Range   Troponin I (High Sensitivity) 2 <18 ng/L    Comment: (NOTE) Elevated high sensitivity troponin I (hsTnI) values and significant  changes across serial measurements may suggest ACS but many other  chronic and acute conditions are known to elevate hsTnI results.  Refer to the "Links" section for chest pain algorithms and additional  guidance. Performed at [facility], [address] [place], [place]  [address]   Troponin I (High Sensitivity)     Status: None   Collection Time: 08/19/19 11:56 PM  Result Value Ref Range   Troponin I (High Sensitivity) <2.0 <18 ng/L    Comment: Performed at [facility], [address] [place],  [address]  SARS Coronavirus 2 [facility] order, Performed in [facility] hospital lab) Nasopharyngeal Nasopharyngeal Swab     Status: None   Collection Time: 08/20/19  1:14 AM   Specimen: Nasopharyngeal Swab  Result Value Ref Range   SARS Coronavirus 2 NEGATIVE NEGATIVE    Comment: (NOTE) If result is NEGATIVE SARS-CoV-2 target nucleic acids are NOT DETECTED. The SARS-CoV-2 RNA is generally detectable in upper and lower  respiratory specimens during the acute phase of infection. The lowest  concentration of SARS-CoV-2 viral copies this assay can detect is 250  copies / mL. A negative result does not preclude SARS-CoV-2 infection  and should not be used as the sole basis for treatment or other  patient management decisions.  A negative result may occur with  improper specimen collection / handling, submission of specimen other  than nasopharyngeal swab, presence of viral mutation(s) within the  areas targeted by this assay, and inadequate number of viral copies  (<250 copies / mL). A negative result must be combined with clinical  observations, patient history, and epidemiological information. If result is POSITIVE SARS-CoV-2 target nucleic acids are DETECTED. The SARS-CoV-2 RNA is generally detectable in upper and lower  respiratory specimens dur ing the acute phase of infection.  Positive  results are indicative of active infection with SARS-CoV-2.  Clinical  correlation with patient history and other diagnostic information is  necessary to determine patient infection status.  Positive results do  not rule out bacterial infection or co-infection with other viruses. If result is PRESUMPTIVE POSTIVE SARS-CoV-2 nucleic acids MAY BE PRESENT.   A  presumptive positive result was obtained on the submitted specimen  and confirmed on repeat testing.  While 2019 novel coronavirus  (SARS-CoV-2) nucleic acids may be present in the submitted sample  additional confirmatory testing may be necessary for epidemiological  and / or clinical management purposes  to differentiate between  SARS-CoV-2 and other Sarbecovirus currently known to infect humans.  If clinically indicated additional testing with an alternate test  methodology [id number]) is advised. The SARS-CoV-2 RNA is generally  detectable in upper and lower respiratory sp ecimens during the acute  phase of infection. The expected result is Negative. Fact Sheet for Patients:  StrictlyIdeas.no Fact Sheet for Healthcare Providers: BankingDealers.co.za  This test is not yet approved or cleared by the [place] and has been authorized for detection and/or diagnosis of SARS-CoV-2 by FDA under an Emergency Use Authorization (EUA).  This EUA will remain in effect (meaning this test can be used) for the duration of the COVID-19 declaration under Section 564(b)(1) of the Act, 21 U.S.C. section 360bbb-3(b)(1), unless the authorization is terminated or revoked sooner. Performed at [facility], [address] [place], [place] [address]    Dg Chest 2 View  Result Date: 08/19/2019 CLINICAL DATA:  Chest pain EXAM: CHEST - 2 VIEW COMPARISON:  05/21/2019 FINDINGS: Heart and mediastinal contours are within normal limits. No focal opacities or effusions. No acute bony abnormality. IMPRESSION: No active cardiopulmonary disease. Electronically Signed   By: Rolm Baptise M.D.   On: 08/19/2019 22:48    Pending Labs Unresulted Labs (From admission, onward)    Start     Ordered   08/20/19 0500  CBC  Tomorrow morning,   R     08/20/19 0159   08/20/19 [id number]  Basic metabolic panel  Tomorrow morning,   R     08/20/19 0159   08/20/19 0500  Magnesium   Tomorrow morning,   R     08/20/19 0159          Vitals/Pain Today's Vitals   08/19/19 2200 08/20/19 0103 08/20/19 0115 08/20/19 0130  BP: (!) 160/102 (!) 142/95  (!) 149/90  Pulse: 83 73  70  Resp: 19 (!) 21  14  Temp:      TempSrc:      SpO2: 96% 94%  96%  Weight:      PainSc:   3      Isolation Precautions No active isolations  Medications Medications  sodium chloride flush (NS) 0.9 % injection 3 mL (3 mLs Intravenous Not Given 08/19/19 2203)  nitroGLYCERIN (NITROSTAT) SL tablet 0.4 mg (0.4 mg Sublingual Given 08/20/19 0108)    Mobility walks Low fall risk   Focused Assessments    R Recommendations: See Admitting Provider Note  Report given to:   Additional Notes:

## 2019-08-20 NOTE — Discharge Summary (Signed)
Physician Discharge Summary  JULIES PAUMEN [id number] DOB: 01-18-1961 DOA: 08/19/2019  PCP: Kathyrn Drown, MD Cardiologist: Dennard Schaumann date: 08/19/2019 Discharge date: 08/20/2019  Admitted From: home  Disposition: Home   Recommendations for Outpatient Follow-up:  1. Follow up with PCP in 1 weeks 2. Please follow up with cardiology 9/28 as scheduled  Discharge Condition: STABLE   CODE STATUS: FULL    Brief Hospitalization Summary: Please see all hospital notes, images, labs for full details of the hospitalization. HPI: Dr. Orlin Hilding:  Jeremy Riley  is a 58 y.o. male, with history of CAD status post DES stent placed in June 2020, and hypertension, presents to the ED today with right-sided chest pain.  Patient reports that chest pain started approximately 8 hours ago.  Patient was not doing anything particular when the chest pain started.  Patient did play 18 holes of golf today, which is relatively normal for him, and had no chest pain at that time.  He reports that this pain is different than the pain that he had when his stent was placed in June.  Flexion/rotation of his neck towards the right makes the pain worse.  Nothing makes the pain better.  Nitro given in the ED does not make the pain better.  The pain does not radiate.  The pain has been constant.  He has associated elevated blood pressure.  Patient and wife report that they check his blood pressure daily, and normal at home has been 119 over 70s to 80s.  Today his blood pressure was 145/90, and that worried him in combination with his chest pain, so they came in to have his "heart checked out."  Patient denies dyspnea and diaphoresis as associated symptoms.  Patient reports 1 missed dose of Brilinta-it was a morning dose last week.  He has not missed any blood pressure doses either.  At the time patient's stent was placed, he had unstable angina -he was not diagnosed with a NSTEMI.  Patient does have high blood pressure and high  cholesterol and is overweight.  He is not diabetic and he does not smoke.  ED course In the ED patient was given nitroglycerin.  COVID test has been collected and is pending.  Patient denies any known exposure to COVID.  Patient was started on cardiac monitoring.  An EKG was done that showed normal sinus rhythm at a rate of 82 with no ST changes.  Chest x-ray was done that showed no acute cardiopulmonary disease.  Blood work was done that showed a leukocytosis and a hyperglycemia.  Troponins were trended they were stable x2.  Patient is being brought in for observation due to multiple risk factors for ACS.  The patient was admitted for chest pain observation.  His symptoms were more consistent with a musculoskeletal reproducible chest pain symptoms.  He reports that the symptoms have resolved now.  His cardiac troponins high-sensitivity have all been minimal.  The patient had a reactive leukocytosis but has resolved now.  His WBC is normal.  He says he feels much better.  He is no longer having any symptoms.  I do not believe that his symptoms were cardiac at all.  He already has outpatient follow-up with Dr. Domenic Polite on 09/09/2019.  I recommended that he follow-up with him.  Follow-up with his PCP in the next week for recheck from the hospital.  His COVID-19 testing was negative.  He is safe to discharge home today with outpatient follow-up as above.  Discharge Diagnoses:  Active Problems:   Chest pain   Discharge Instructions: Discharge Instructions    Diet - low sodium heart healthy   Complete by: As directed    Increase activity slowly   Complete by: As directed      Allergies as of 08/20/2019   No Known Allergies     Medication List    STOP taking these medications   amoxicillin 500 MG capsule Commonly known as: AMOXIL     TAKE these medications   acetaminophen 500 MG tablet Commonly known as: TYLENOL Take 500 mg by mouth every 6 (six) hours as needed.   aspirin 81 MG chewable  tablet Chew 1 tablet (81 mg total) by mouth daily.   atorvastatin 80 MG tablet Commonly known as: LIPITOR Take 1 tablet (80 mg total) by mouth daily at 6 PM.   carvedilol 3.125 MG tablet Commonly known as: COREG Take 1 tablet (3.125 mg total) by mouth 2 (two) times daily with a meal.   losartan-hydrochlorothiazide 100-12.5 MG tablet Commonly known as: HYZAAR Take 1 tablet by mouth daily.   nitroGLYCERIN 0.4 MG SL tablet Commonly known as: NITROSTAT Place 1 tablet (0.4 mg total) under the tongue every 5 (five) minutes x 3 doses as needed for chest pain.   ticagrelor 90 MG Tabs tablet Commonly known as: BRILINTA Take 1 tablet (90 mg total) by mouth 2 (two) times daily.      Follow-up Information    Luking, Elayne Snare, MD. Schedule an appointment as soon as possible for a visit in 2 week(s).   Specialty: Family Medicine Contact information: [address] [place] [address] [phone]        Satira Sark, MD .   Specialty: Cardiology Contact information: [place] [address] [phone]          No Known Allergies Allergies as of 08/20/2019   No Known Allergies     Medication List    STOP taking these medications   amoxicillin 500 MG capsule Commonly known as: AMOXIL     TAKE these medications   acetaminophen 500 MG tablet Commonly known as: TYLENOL Take 500 mg by mouth every 6 (six) hours as needed.   aspirin 81 MG chewable tablet Chew 1 tablet (81 mg total) by mouth daily.   atorvastatin 80 MG tablet Commonly known as: LIPITOR Take 1 tablet (80 mg total) by mouth daily at 6 PM.   carvedilol 3.125 MG tablet Commonly known as: COREG Take 1 tablet (3.125 mg total) by mouth 2 (two) times daily with a meal.   losartan-hydrochlorothiazide 100-12.5 MG tablet Commonly known as: HYZAAR Take 1 tablet by mouth daily.   nitroGLYCERIN 0.4 MG SL tablet Commonly known as: NITROSTAT Place 1 tablet (0.4 mg total) under  the tongue every 5 (five) minutes x 3 doses as needed for chest pain.   ticagrelor 90 MG Tabs tablet Commonly known as: BRILINTA Take 1 tablet (90 mg total) by mouth 2 (two) times daily.       Procedures/Studies: Dg Chest 2 View  Result Date: 08/19/2019 CLINICAL DATA:  Chest pain EXAM: CHEST - 2 VIEW COMPARISON:  05/21/2019 FINDINGS: Heart and mediastinal contours are within normal limits. No focal opacities or effusions. No acute bony abnormality. IMPRESSION: No active cardiopulmonary disease. Electronically Signed   By: Rolm Baptise M.D.   On: 08/19/2019 22:48      Subjective: Patient says he is feeling much better today.  He is no longer having  any chest pain symptoms.  Discharge Exam: Vitals:   08/20/19 0334 08/20/19 0522  BP: (!) 144/96 (!) 146/87  Pulse: 64 61  Resp: 18 18  Temp: 97.7 F (36.5 C) 97.6 F (36.4 C)  SpO2: 98% 97%   Vitals:   08/20/19 0230 08/20/19 0300 08/20/19 0334 08/20/19 0522  BP: (!) 129/94 132/87 (!) 144/96 (!) 146/87  Pulse: 70 60 64 61  Resp: 17 17 18 18   Temp:   97.7 F (36.5 C) 97.6 F (36.4 C)  TempSrc:   Oral Oral  SpO2: 95% 97% 98% 97%  Weight:   107.8 kg   Height:   5\' 8"  (1.727 m)    General: Pt is alert, awake, not in acute distress Cardiovascular: RRR, S1/S2 +, no rubs, no gallops Respiratory: CTA bilaterally, no wheezing, no rhonchi Abdominal: Soft, NT, ND, bowel sounds + Extremities: no edema, no cyanosis   The results of significant diagnostics from this hospitalization (including imaging, microbiology, ancillary and laboratory) are listed below for reference.     Microbiology: Recent Results (from the past 240 hour(s))  SARS Coronavirus 2 [facility] order, Performed in [facility] hospital lab) Nasopharyngeal Nasopharyngeal Swab     Status: None   Collection Time: 08/20/19  1:14 AM   Specimen: Nasopharyngeal Swab  Result Value Ref Range Status   SARS Coronavirus 2 NEGATIVE NEGATIVE Final    Comment: (NOTE) If result  is NEGATIVE SARS-CoV-2 target nucleic acids are NOT DETECTED. The SARS-CoV-2 RNA is generally detectable in upper and lower  respiratory specimens during the acute phase of infection. The lowest  concentration of SARS-CoV-2 viral copies this assay can detect is 250  copies / mL. A negative result does not preclude SARS-CoV-2 infection  and should not be used as the sole basis for treatment or other  patient management decisions.  A negative result may occur with  improper specimen collection / handling, submission of specimen other  than nasopharyngeal swab, presence of viral mutation(s) within the  areas targeted by this assay, and inadequate number of viral copies  (<250 copies / mL). A negative result must be combined with clinical  observations, patient history, and epidemiological information. If result is POSITIVE SARS-CoV-2 target nucleic acids are DETECTED. The SARS-CoV-2 RNA is generally detectable in upper and lower  respiratory specimens dur ing the acute phase of infection.  Positive  results are indicative of active infection with SARS-CoV-2.  Clinical  correlation with patient history and other diagnostic information is  necessary to determine patient infection status.  Positive results do  not rule out bacterial infection or co-infection with other viruses. If result is PRESUMPTIVE POSTIVE SARS-CoV-2 nucleic acids MAY BE PRESENT.   A presumptive positive result was obtained on the submitted specimen  and confirmed on repeat testing.  While 2019 novel coronavirus  (SARS-CoV-2) nucleic acids may be present in the submitted sample  additional confirmatory testing may be necessary for epidemiological  and / or clinical management purposes  to differentiate between  SARS-CoV-2 and other Sarbecovirus currently known to infect humans.  If clinically indicated additional testing with an alternate test  methodology [phone]) is advised. The SARS-CoV-2 RNA is generally   detectable in upper and lower respiratory sp ecimens during the acute  phase of infection. The expected result is Negative. Fact Sheet for Patients:  StrictlyIdeas.no Fact Sheet for Healthcare Providers: BankingDealers.co.za This test is not yet approved or cleared by the [place] FDA and has been authorized for detection and/or diagnosis of  SARS-CoV-2 by FDA under an Emergency Use Authorization (EUA).  This EUA will remain in effect (meaning this test can be used) for the duration of the COVID-19 declaration under Section 564(b)(1) of the Act, 21 U.S.C. section 360bbb-3(b)(1), unless the authorization is terminated or revoked sooner. Performed at [facility], [address], [place],  [address]      Labs: BNP (last 3 results) No results for input(s): BNP in the last 8760 hours. Basic Metabolic Panel: Recent Labs  Lab 08/19/19 2145 08/20/19 0430  NA 139 142  K 3.6 4.4  CL 106 106  CO2 21* 27  GLUCOSE 134* 130*  BUN 19 17  CREATININE 1.09 1.05  CALCIUM 9.4 9.3  MG  --  2.2   Liver Function Tests: No results for input(s): AST, ALT, ALKPHOS, BILITOT, PROT, ALBUMIN in the last 168 hours. No results for input(s): LIPASE, AMYLASE in the last 168 hours. No results for input(s): AMMONIA in the last 168 hours. CBC: Recent Labs  Lab 08/19/19 2145 08/20/19 0430  WBC 11.6* 9.6  HGB 15.3 14.8  HCT 44.5 44.5  MCV 90.1 91.6  PLT 244 235   Cardiac Enzymes: No results for input(s): CKTOTAL, CKMB, CKMBINDEX, TROPONINI in the last 168 hours. BNP: Invalid input(s): POCBNP CBG: No results for input(s): GLUCAP in the last 168 hours. D-Dimer No results for input(s): DDIMER in the last 72 hours. Hgb A1c No results for input(s): HGBA1C in the last 72 hours. Lipid Profile No results for input(s): CHOL, HDL, LDLCALC, TRIG, CHOLHDL, LDLDIRECT in the last 72 hours. Thyroid function studies No results for input(s): TSH,  T4TOTAL, T3FREE, THYROIDAB in the last 72 hours.  Invalid input(s): FREET3 Anemia work up No results for input(s): VITAMINB12, FOLATE, FERRITIN, TIBC, IRON, RETICCTPCT in the last 72 hours. Urinalysis    Component Value Date/Time   COLORURINE YELLOW 05/21/2008 0638   APPEARANCEUR CLEAR 05/21/2008 0638   LABSPEC >1.030 (H) 05/21/2008 0638   PHURINE 5.0 05/21/2008 0638   GLUCOSEU NEGATIVE 05/21/2008 0638   HGBUR LARGE (A) 05/21/2008 0638   BILIRUBINUR NEGATIVE 05/21/2008 0638   KETONESUR NEGATIVE 05/21/2008 0638   PROTEINUR NEGATIVE 05/21/2008 0638   UROBILINOGEN 0.2 05/21/2008 0638   NITRITE NEGATIVE 05/21/2008 0638   LEUKOCYTESUR Negative 02/27/2018 1622   Sepsis Labs Invalid input(s): PROCALCITONIN,  WBC,  LACTICIDVEN Microbiology Recent Results (from the past 240 hour(s))  SARS Coronavirus 2 [facility] order, Performed in [facility] hospital lab) Nasopharyngeal Nasopharyngeal Swab     Status: None   Collection Time: 08/20/19  1:14 AM   Specimen: Nasopharyngeal Swab  Result Value Ref Range Status   SARS Coronavirus 2 NEGATIVE NEGATIVE Final    Comment: (NOTE) If result is NEGATIVE SARS-CoV-2 target nucleic acids are NOT DETECTED. The SARS-CoV-2 RNA is generally detectable in upper and lower  respiratory specimens during the acute phase of infection. The lowest  concentration of SARS-CoV-2 viral copies this assay can detect is 250  copies / mL. A negative result does not preclude SARS-CoV-2 infection  and should not be used as the sole basis for treatment or other  patient management decisions.  A negative result may occur with  improper specimen collection / handling, submission of specimen other  than nasopharyngeal swab, presence of viral mutation(s) within the  areas targeted by this assay, and inadequate number of viral copies  (<250 copies / mL). A negative result must be combined with clinical  observations, patient history, and epidemiological information. If  result is POSITIVE SARS-CoV-2  target nucleic acids are DETECTED. The SARS-CoV-2 RNA is generally detectable in upper and lower  respiratory specimens dur ing the acute phase of infection.  Positive  results are indicative of active infection with SARS-CoV-2.  Clinical  correlation with patient history and other diagnostic information is  necessary to determine patient infection status.  Positive results do  not rule out bacterial infection or co-infection with other viruses. If result is PRESUMPTIVE POSTIVE SARS-CoV-2 nucleic acids MAY BE PRESENT.   A presumptive positive result was obtained on the submitted specimen  and confirmed on repeat testing.  While 2019 novel coronavirus  (SARS-CoV-2) nucleic acids may be present in the submitted sample  additional confirmatory testing may be necessary for epidemiological  and / or clinical management purposes  to differentiate between  SARS-CoV-2 and other Sarbecovirus currently known to infect humans.  If clinically indicated additional testing with an alternate test  methodology [phone]) is advised. The SARS-CoV-2 RNA is generally  detectable in upper and lower respiratory sp ecimens during the acute  phase of infection. The expected result is Negative. Fact Sheet for Patients:  StrictlyIdeas.no Fact Sheet for Healthcare Providers: BankingDealers.co.za This test is not yet approved or cleared by the [place] FDA and has been authorized for detection and/or diagnosis of SARS-CoV-2 by FDA under an Emergency Use Authorization (EUA).  This EUA will remain in effect (meaning this test can be used) for the duration of the COVID-19 declaration under Section 564(b)(1) of the Act, 21 U.S.C. section 360bbb-3(b)(1), unless the authorization is terminated or revoked sooner. Performed at [facility], [address], [place], [place] [address]     Time coordinating discharge:    SIGNED:  Irwin Brakeman, MD  [facility] 08/20/2019, 9:11 AM How to contact the [facility] Attending or Consulting provider [place] or covering provider during after hours [place], for this patient?  1. Check the care team in [facility] and look for a) attending/consulting [facility] provider listed and b) the [facility] team listed 2. Log into [email or web address] and use [place]'s universal password to access. If you do not have the password, please contact the hospital operator. 3. Locate the [facility] provider you are looking for under [facility] and page to a number that you can be directly reached. 4. If you still have difficulty reaching the provider, please page the [facility] (Director on Call) for the Hospitalists listed on amion for assistance.

## 2019-08-20 NOTE — Progress Notes (Signed)
PT DISCHARGED HOME, IV REMOVED, ANGIO INTACT, VS STABLE, DENIES C/O PAIN, VERBALIZED UNDERSTANDING OF ALL INSTRUCTIONS PROVIDED.PT LEFT FLOOR VIA WHEELCHAIR WITH BELONGINGS ACCOMPANIED BY NURSING STAFF, TRANSPORTED HOME BY SPOUSE.

## 2019-08-20 NOTE — Discharge Instructions (Signed)
Chest Wall Pain Chest wall pain is pain in or around the bones and muscles of your chest. Chest wall pain may be caused by:  An injury.  Coughing a lot.  Using your chest and arm muscles too much. Sometimes, the cause may not be known. This pain may take a few weeks or longer to get better. Follow these instructions at home: Managing pain, stiffness, and swelling If told, put ice on the painful area:  Put ice in a plastic bag.  Place a towel between your skin and the bag.  Leave the ice on for 20 minutes, 2-3 times a day.  Activity  Rest as told by your doctor.  Avoid doing things that cause pain. This includes lifting heavy items.  Ask your doctor what activities are safe for you. General instructions   Take over-the-counter and prescription medicines only as told by your doctor.  Do not use any products that contain nicotine or tobacco, such as cigarettes, e-cigarettes, and chewing tobacco. If you need help quitting, ask your doctor.  Keep all follow-up visits as told by your doctor. This is important. Contact a doctor if:  You have a fever.  Your chest pain gets worse.  You have new symptoms. Get help right away if:  You feel sick to your stomach (nauseous) or you throw up (vomit).  You feel sweaty or light-headed.  You have a cough with mucus from your lungs (sputum) or you cough up blood.  You are short of breath. These symptoms may be an emergency. Do not wait to see if the symptoms will go away. Get medical help right away. Call your local emergency services (911 in the [place] Do not drive yourself to the hospital. Summary  Chest wall pain is pain in or around the bones and muscles of your chest.  It may be treated with ice, rest, and medicines. Your condition may also get better if you avoid doing things that cause pain.  Contact a doctor if you have a fever, chest pain that gets worse, or new symptoms.  Get help right away if you feel light-headed  or you get short of breath. These symptoms may be an emergency. This information is not intended to replace advice given to you by your health care provider. Make sure you discuss any questions you have with your health care provider. Document Released: 05/16/2008 Document Revised: 05/31/2018 Document Reviewed: 05/31/2018 Elsevier Patient Education  2020 [employer].   Nonspecific Chest Pain Chest pain can be caused by many different conditions. Some causes of chest pain can be life-threatening. These will require treatment right away. Serious causes of chest pain include:  Heart attack.  A tear in the body's main blood vessel.  Redness and swelling (inflammation) around your heart.  Blood clot in your lungs. Other causes of chest pain may not be so serious. These include:  Heartburn.  Anxiety or stress.  Damage to bones or muscles in your chest.  Lung infections. Chest pain can feel like:  Pain or discomfort in your chest.  Crushing, pressure, aching, or squeezing pain.  Burning or tingling.  Dull or sharp pain that is worse when you move, cough, or take a deep breath.  Pain or discomfort that is also felt in your back, neck, jaw, shoulder, or arm, or pain that spreads to any of these areas. It is hard to know whether your pain is caused by something that is serious or something that is not so serious. So it  is important to see your doctor right away if you have chest pain. Follow these instructions at home: Medicines  Take over-the-counter and prescription medicines only as told by your doctor.  If you were prescribed an antibiotic medicine, take it as told by your doctor. Do not stop taking the antibiotic even if you start to feel better. Lifestyle   Rest as told by your doctor.  Do not use any products that contain nicotine or tobacco, such as cigarettes, e-cigarettes, and chewing tobacco. If you need help quitting, ask your doctor.  Do not drink  alcohol.  Make lifestyle changes as told by your doctor. These may include: ? Getting regular exercise. Ask your doctor what activities are safe for you. ? Eating a heart-healthy diet. A diet and nutrition specialist (dietitian) can help you to learn healthy eating options. ? Staying at a healthy weight. ? Treating diabetes or high blood pressure, if needed. ? Lowering your stress. Activities such as yoga and relaxation techniques can help. General instructions  Pay attention to any changes in your symptoms. Tell your doctor about them or any new symptoms.  Avoid any activities that cause chest pain.  Keep all follow-up visits as told by your doctor. This is important. You may need more testing if your chest pain does not go away. Contact a doctor if:  Your chest pain does not go away.  You feel depressed.  You have a fever. Get help right away if:  Your chest pain is worse.  You have a cough that gets worse, or you cough up blood.  You have very bad (severe) pain in your belly (abdomen).  You pass out (faint).  You have either of these for no clear reason: ? Sudden chest discomfort. ? Sudden discomfort in your arms, back, neck, or jaw.  You have shortness of breath at any time.  You suddenly start to sweat, or your skin gets clammy.  You feel sick to your stomach (nauseous).  You throw up (vomit).  You suddenly feel lightheaded or dizzy.  You feel very weak or tired.  Your heart starts to beat fast, or it feels like it is skipping beats. These symptoms may be an emergency. Do not wait to see if the symptoms will go away. Get medical help right away. Call your local emergency services (911 in the [place] Do not drive yourself to the hospital. Summary  Chest pain can be caused by many different conditions. The cause may be serious and need treatment right away. If you have chest pain, see your doctor right away.  Follow your doctor's instructions for taking  medicines and making lifestyle changes.  Keep all follow-up visits as told by your doctor. This includes visits for any further testing if your chest pain does not go away.  Be sure to know the signs that show that your condition has become worse. Get help right away if you have these symptoms. This information is not intended to replace advice given to you by your health care provider. Make sure you discuss any questions you have with your health care provider. Document Released: 05/16/2008 Document Revised: 05/31/2018 Document Reviewed: 05/31/2018 Elsevier Patient Education  2020 [employer].   IMPORTANT INFORMATION: PAY CLOSE ATTENTION   PHYSICIAN DISCHARGE INSTRUCTIONS  Follow with Primary care provider  Kathyrn Drown, MD  and other consultants as instructed by your Hospitalist Physician  [place], WORSEN OR NEW PROBLEM DEVELOPS  Please note: You were cared for by a hospitalist during your hospital stay. Every effort will be made to forward records to your primary care provider.  You can request that your primary care provider send for your hospital records if they have not received them.  Once you are discharged, your primary care physician will handle any further medical issues. Please note that NO REFILLS for any discharge medications will be authorized once you are discharged, as it is imperative that you return to your primary care physician (or establish a relationship with a primary care physician if you do not have one) for your post hospital discharge needs so that they can reassess your need for medications and monitor your lab values.  Please get a complete blood count and chemistry panel checked by your Primary MD at your next visit, and again as instructed by your Primary MD.  Get Medicines reviewed and adjusted: Please take all your medications with you for your next visit with your Primary  MD  Laboratory/radiological data: Please request your Primary MD to go over all hospital tests and procedure/radiological results at the follow up, please ask your primary care provider to get all Hospital records sent to his/her office.  In some cases, they will be blood work, cultures and biopsy results pending at the time of your discharge. Please request that your primary care provider follow up on these results.  If you are diabetic, please bring your blood sugar readings with you to your follow up appointment with primary care.    Please call and make your follow up appointments as soon as possible.    Also Note the following: If you experience worsening of your admission symptoms, develop shortness of breath, life threatening emergency, suicidal or homicidal thoughts you must seek medical attention immediately by calling 911 or calling your MD immediately  if symptoms less severe.  You must read complete instructions/literature along with all the possible adverse reactions/side effects for all the Medicines you take and that have been prescribed to you. Take any new Medicines after you have completely understood and accpet all the possible adverse reactions/side effects.   Do not drive when taking Pain medications or sleeping medications (Benzodiazepines)  Do not take more than prescribed Pain, Sleep and Anxiety Medications. It is not advisable to combine anxiety,sleep and pain medications without talking with your primary care practitioner  Special Instructions: If you have smoked or chewed Tobacco  in the last 2 yrs please stop smoking, stop any regular Alcohol  and or any Recreational drug use.  Wear Seat belts while driving.  Do not drive if taking any narcotic, mind altering or controlled substances or recreational drugs or alcohol.

## 2019-08-22 ENCOUNTER — Ambulatory Visit (INDEPENDENT_AMBULATORY_CARE_PROVIDER_SITE_OTHER): Payer: BLUE CROSS/BLUE SHIELD | Admitting: Family Medicine

## 2019-08-22 ENCOUNTER — Encounter: Payer: Self-pay | Admitting: Family Medicine

## 2019-08-22 ENCOUNTER — Other Ambulatory Visit: Payer: Self-pay

## 2019-08-22 VITALS — BP 117/76

## 2019-08-22 DIAGNOSIS — I10 Essential (primary) hypertension: Secondary | ICD-10-CM

## 2019-08-22 DIAGNOSIS — R079 Chest pain, unspecified: Secondary | ICD-10-CM | POA: Diagnosis not present

## 2019-08-22 NOTE — Progress Notes (Signed)
Subjective:    Patient ID: Jeremy Riley, male    DOB: Oct 03, 1961, 58 y.o.   MRN: [id number] Telephone visit video not possible HPI Pt was seen at ER for chest pain on 08/19/2019. Pt was admitted for observation. Pt has lab work done and an enzyme test; pt states they found nothing wrong with heart. Pt states that his BP has come back down. Pt seems to think his chest pain was maybe coming from back.  The pain was more of the upper chest radiating to his back which I told him is unlikely due to his low back pain.  Pt has been having lower back pain. Pt states that chest pain and back pain have subsided.  Patient states when he was having the symptoms it felt more like a ache in his chest but he denied breaking out in sweat or having shortness of breath.  ER notes, EKG, lab work reviewed  Pt states that he was told not to take ibuprofen any longer but states that ibuprofen is the only med that helps with back. Pt wants to know if there is any other alternative med to help with back pain. Tylenol does not help pt.  Patient relates low back pain intermittently.  Does not radiate down the legs.  Ibuprofen used to help but he can no longer take this because he is on Brilinta.  He denies any bleeding issues.  He does play golf intermittently and does do walking on his job denies any chest tightness pressure pain    Virtual Visit via Video Note  I connected with Jeremy Riley on 08/22/19 at  2:00 PM EDT by a video enabled telemedicine application and verified that I am speaking with the correct person using two identifiers.  Location: Patient: home Provider: office   I discussed the limitations of evaluation and management by telemedicine and the availability of in person appointments. The patient expressed understanding and agreed to proceed.  History of Present Illness:    Observations/Objective:   Assessment and Plan:   Follow Up Instructions:    I discussed the assessment  and treatment plan with the patient. The patient was provided an opportunity to ask questions and all were answered. The patient agreed with the plan and demonstrated an understanding of the instructions.   The patient was advised to call back or seek an in-person evaluation if the symptoms worsen or if the condition fails to improve as anticipated.  I provided 17 minutes of non-face-to-face time during this encounter.   Vicente Males, LPN    Review of Systems  Constitutional: Negative for activity change, fatigue and fever.  HENT: Negative for congestion and rhinorrhea.   Respiratory: Negative for cough and shortness of breath.   Cardiovascular: Positive for chest pain. Negative for leg swelling.  Gastrointestinal: Negative for abdominal pain, diarrhea and nausea.  Genitourinary: Negative for dysuria and hematuria.  Neurological: Negative for weakness and headaches.  Psychiatric/Behavioral: Negative for agitation and behavioral problems.       Objective:   Physical Exam Today's visit was via telephone Physical exam was not possible for this visit        Assessment & Plan:  Chest pain Resolved Could have been musculoskeletal but I doubt it was related to his back  Patient did have catheterization with stent earlier this year  I did encourage the patient to keep his follow-up with cardiology. Continue his current medications Avoid ibuprofen NSAIDs May use Tylenol for his back  Cardiology may choose to do stress test for this patient  Flu shot this fall  Follow-up if any ongoing troubles or problems

## 2019-08-29 ENCOUNTER — Telehealth: Payer: Self-pay | Admitting: Family Medicine

## 2019-08-29 NOTE — Telephone Encounter (Signed)
And first I was hopeful that the patient could use Voltaren gel but unfortunately even that can absorb through the skin and increase the risk of bleeding  We can allow for low-dose tramadol for evening use if he is interested this is a pain medicine but not as strong as hydrocodone.  It can be used for intermittent pain but I would not recommend being on it on a regular basis.  If the patient is interested I can send in a short prescription.  (Please note I might not be able to send it in until Monday)

## 2019-08-29 NOTE — Telephone Encounter (Signed)
Pt states Dr. Nicki Reaper recommended something OTC for his back pain, some kind of topical cream/ointment  Please advise & call pt

## 2019-08-29 NOTE — Telephone Encounter (Signed)
Left message to return call to get more information 

## 2019-08-29 NOTE — Telephone Encounter (Signed)
??  Do not understand this messsage and see nothing in last visit re topical rx?

## 2019-08-29 NOTE — Telephone Encounter (Signed)
Sorry dr Nicki Reaper will have to address that

## 2019-08-29 NOTE — Telephone Encounter (Signed)
Discussed with pt. Pt does want to try tramadol. [facility] [place]

## 2019-08-29 NOTE — Telephone Encounter (Signed)
Pt states he has lower back pain for years. Some days worse than others like today when its raining. He use to take ibuprofen but since his stent he cannot take that anymore. Dr Nicki Reaper told him he could give him something for pain but said he wanted him to try a cream first before going down the road of taking pain meds. He said he was going to give him something that was better than bioflex.

## 2019-08-30 ENCOUNTER — Other Ambulatory Visit: Payer: Self-pay | Admitting: Family Medicine

## 2019-08-30 MED ORDER — TRAMADOL HCL 50 MG PO TABS: 50.0000 mg | ORAL_TABLET | Freq: Three times a day (TID) | ORAL | 0 refills | Status: AC | PRN

## 2019-08-30 NOTE — Telephone Encounter (Signed)
Discussed with pt. Pt verbalized understanding.  °

## 2019-08-30 NOTE — Telephone Encounter (Signed)
I did send in tramadol I recommend that this be used only when at home and preferably sparingly  If he needs more in the future he can notify us and we can send in a refill thank you

## 2019-09-09 ENCOUNTER — Telehealth (INDEPENDENT_AMBULATORY_CARE_PROVIDER_SITE_OTHER): Payer: BLUE CROSS/BLUE SHIELD | Admitting: Cardiology

## 2019-09-09 ENCOUNTER — Other Ambulatory Visit: Payer: Self-pay

## 2019-09-09 ENCOUNTER — Encounter: Payer: Self-pay | Admitting: Cardiology

## 2019-09-09 VITALS — BP 117/76 | HR 75 | Ht 68.0 in | Wt 235.0 lb

## 2019-09-09 DIAGNOSIS — E782 Mixed hyperlipidemia: Secondary | ICD-10-CM

## 2019-09-09 DIAGNOSIS — I25119 Atherosclerotic heart disease of native coronary artery with unspecified angina pectoris: Secondary | ICD-10-CM

## 2019-09-09 DIAGNOSIS — Z720 Tobacco use: Secondary | ICD-10-CM

## 2019-09-09 DIAGNOSIS — I10 Essential (primary) hypertension: Secondary | ICD-10-CM

## 2019-09-09 DIAGNOSIS — F17201 Nicotine dependence, unspecified, in remission: Secondary | ICD-10-CM

## 2019-09-09 NOTE — Patient Instructions (Signed)

## 2019-09-09 NOTE — Progress Notes (Signed)
Virtual Visit via Video Note   This visit type was conducted due to national recommendations for restrictions regarding the COVID-19 Pandemic (e.g. social distancing) in an effort to limit this patient's exposure and mitigate transmission in our community.  Due to his co-morbid illnesses, this patient is at least at moderate risk for complications without adequate follow up.  This format is felt to be most appropriate for this patient at this time.  All issues noted in this document were discussed and addressed.  A limited physical exam was performed with this format.  Please refer to the patient's chart for his consent to telehealth for [facility].   Date:  09/09/2019   ID:  Jeremy Riley, DOB [birth year], MRN [id number]  Patient Location: Home Provider Location: Home  PCP:  Kathyrn Drown, MD  Cardiologist:  Rozann Lesches, MD Electrophysiologist:  None   Evaluation Performed:  Follow-Up Visit  Chief Complaint:   Cardiac follow-up  History of Present Illness:    Jeremy Riley is a 58 y.o. male last assessed via telehealth encounter by Ms. Strader PA-C in June.  He is status post DES intervention to the distal circumflex setting of unstable angina back in June, otherwise mild to moderate residual CAD that is being managed medically.  More recently he was observed in the hospital in early September with atypical chest discomfort, felt to be musculoskeletal based on review of the records and no evidence of ACS.  We communicated by video conferencing today.  He states that he has been doing well.  Reports compliance with his medications, no bleeding problems on dual antiplatelet therapy.  Follow-up lipid panel in August showed significant improvement in LDL from 134 down to 43.  He has lost about 20 pounds through diet.  Also walking and playing some golf for exercise.  The patient does not have symptoms concerning for COVID-19 infection (fever, chills, cough, or new shortness  of breath).    Past Medical History:  Diagnosis Date  . Coronary artery disease    a. LHC 05/22/2019: 99% dCx s/p DES, luminal irregularities in LAD and dominant RCA  . History of pneumonia as a child   . Hyperlipidemia   . Hypertension   . Lumbar disc disease    Fall from ladder  . Pre-diabetes    Hgb A1c 6.3 in 05/2019.   Past Surgical History:  Procedure Laterality Date  . CORONARY STENT INTERVENTION N/A 05/22/2019   Procedure: CORONARY STENT INTERVENTION;  Surgeon: Belva Crome, MD;  Location: [facility];  Service: Cardiovascular;  Laterality: N/A;  . LEFT HEART CATH AND CORONARY ANGIOGRAPHY N/A 05/22/2019   Procedure: LEFT HEART CATH AND CORONARY ANGIOGRAPHY;  Surgeon: Belva Crome, MD;  Location: [facility];  Service: Cardiovascular;  Laterality: N/A;     Current Meds  Medication Sig  . acetaminophen (TYLENOL) 500 MG tablet Take 500 mg by mouth every 6 (six) hours as needed.  Marland Kitchen aspirin 81 MG chewable tablet Chew 1 tablet (81 mg total) by mouth daily.  Marland Kitchen atorvastatin (LIPITOR) 80 MG tablet Take 1 tablet (80 mg total) by mouth daily at 6 PM.  . carvedilol (COREG) 3.125 MG tablet Take 1 tablet (3.125 mg total) by mouth 2 (two) times daily with a meal.  . losartan-hydrochlorothiazide (HYZAAR) 100-12.5 MG tablet Take 1 tablet by mouth daily.  . nitroGLYCERIN (NITROSTAT) 0.4 MG SL tablet Place 1 tablet (0.4 mg total) under the tongue every 5 (five) minutes x  3 doses as needed for chest pain.  . ticagrelor (BRILINTA) 90 MG TABS tablet Take 1 tablet (90 mg total) by mouth 2 (two) times daily.     Allergies:   Patient has no known allergies.   Social History   Tobacco Use  . Smoking status: Former Smoker    Types: Cigarettes    Quit date: 01/08/2009    Years since quitting: 10.6  . Smokeless tobacco: Current User    Types: Chew  Substance Use Topics  . Alcohol use: Yes    Comment: occ  . Drug use: Not Currently     Family Hx: The patient's family  history includes Heart disease in his father.  ROS:   Please see the history of present illness. All other systems reviewed and are negative.   Prior CV studies:   The following studies were reviewed today:  Cardiac catheterization 05/22/2019:  Unstable angina pectoris  Successful DES implantation in distal circumflex reducing a culprit 99% stenosis to 0% using a 2.5 x 18 Onyx postdilated to 2.75 mm.  TIMI grade III flow noted.  Normal left main.  Luminal irregularities in LAD  Luminal irregularities and dominant right coronary  RECOMMENDATIONS:   Aggressive secondary risk factor modification: Consider the diagnosis of sleep apnea; hemoglobin A1c should be less than 7; LDL cholesterol less than 70 and preferably 55 or less; blood pressure 130/80 mmHg or less; moderate aerobic activity; discontinue vaping.  Eligible for discharge in a.m. if no problems.  Labs/Other Tests and Data Reviewed:    EKG:  An ECG dated 08/20/2019 was personally reviewed today and demonstrated:  Sinus rhythm.  Recent Labs: 07/18/2019: ALT 33 08/20/2019: BUN 17; Creatinine, Ser 1.05; Hemoglobin 14.8; Magnesium 2.2; Platelets 235; Potassium 4.4; Sodium 142   Recent Lipid Panel Lab Results  Component Value Date/Time   CHOL 95 07/18/2019 08:39 AM   CHOL 194 09/08/2017 08:51 AM   TRIG 109 07/18/2019 08:39 AM   HDL 30 (L) 07/18/2019 08:39 AM   HDL 40 09/08/2017 08:51 AM   CHOLHDL 3.2 07/18/2019 08:39 AM   LDLCALC 43 07/18/2019 08:39 AM   LDLCALC 125 (H) 09/08/2017 08:51 AM    Wt Readings from Last 3 Encounters:  09/09/19 235 lb (106.6 kg)  08/20/19 237 lb 10.5 oz (107.8 kg)  06/07/19 255 lb (115.7 kg)     Objective:    Vital Signs:  BP 117/76   Pulse 75   Ht 5\' 8"  (1.727 m)   Wt 235 lb (106.6 kg)   BMI 35.73 kg/m    General: Patient appears comfortable at rest. HEENT: Conjunctiva and lids normal. Skin: Normal appearance of color. Lungs: Patient spoke in full sentences, not short of  breath, no audible wheezing or coughing. Musculoskeletal: No kyphosis. Neuropsychiatric: Gaze conjugate.  Speech pattern normal.  Patient moves all extremities.  Affect appropriate.  ASSESSMENT & PLAN:    1.  CAD status post DES to the distal left circumflex with otherwise mild to moderate residual disease being managed medically.  He reports no active angina on present regimen. I encouraged exercise and weight loss.  2.  Mixed hyperlipidemia, he continues on Lipitor with recent LDL down to 43.  3.  Essential hypertension, blood pressure is well controlled today.  No changes made to current regimen.  4.  Tobacco abuse in remission.  COVID-19 Education: The signs and symptoms of COVID-19 were discussed with the patient and how to seek care for testing (follow up with PCP or arrange E-visit).  The importance of social distancing was discussed today.  Time:   Today, I have spent 8 minutes with the patient with telehealth technology discussing the above problems.     Medication Adjustments/Labs and Tests Ordered: Current medicines are reviewed at length with the patient today.  Concerns regarding medicines are outlined above.   Tests Ordered: No orders of the defined types were placed in this encounter.   Medication Changes: No orders of the defined types were placed in this encounter.   Follow Up:  In Person 6 months in the [place] office.  Signed, Rozann Lesches, MD  09/09/2019 11:40 AM    [place]

## 2019-10-29 ENCOUNTER — Other Ambulatory Visit: Payer: Self-pay

## 2019-10-29 DIAGNOSIS — Z20822 Contact with and (suspected) exposure to covid-19: Secondary | ICD-10-CM

## 2019-10-30 ENCOUNTER — Telehealth: Payer: Self-pay | Admitting: *Deleted

## 2019-10-30 NOTE — Telephone Encounter (Signed)
Patient calling for test result- notified still pending. Activation code for [place]- given.

## 2019-10-31 ENCOUNTER — Telehealth: Payer: Self-pay | Admitting: Family Medicine

## 2019-10-31 LAB — NOVEL CORONAVIRUS, NAA: SARS-CoV-2, NAA: DETECTED — AB

## 2019-10-31 NOTE — Telephone Encounter (Signed)
Patient called to let you know he tested positive for Covid.He was tested on 11/17.

## 2019-10-31 NOTE — Telephone Encounter (Signed)
Wife calling back to see if he can take any of the immunity boosts that have zinc in it.  She didn't know if it would interact with any of his medicines.    Also has multiple other questions about quarantining.

## 2019-10-31 NOTE — Telephone Encounter (Signed)
I dont think will do anything

## 2019-10-31 NOTE — Telephone Encounter (Signed)
Discussed with pt. Pt verbalized understanding.  °

## 2019-10-31 NOTE — Telephone Encounter (Signed)
Pt states he feels ok. Just feels like he has a head cold. Does not have any smell or taste, no fever, no sob. States his boss was positive. Wife got tested today.

## 2019-11-04 ENCOUNTER — Other Ambulatory Visit: Payer: Self-pay

## 2019-11-04 ENCOUNTER — Telehealth: Payer: Self-pay | Admitting: Family Medicine

## 2019-11-04 ENCOUNTER — Ambulatory Visit (INDEPENDENT_AMBULATORY_CARE_PROVIDER_SITE_OTHER): Payer: BLUE CROSS/BLUE SHIELD | Admitting: Family Medicine

## 2019-11-04 ENCOUNTER — Encounter: Payer: Self-pay | Admitting: Family Medicine

## 2019-11-04 DIAGNOSIS — U071 COVID-19: Secondary | ICD-10-CM

## 2019-11-04 MED ORDER — ONDANSETRON HCL 8 MG PO TABS: 8.0000 mg | ORAL_TABLET | Freq: Three times a day (TID) | ORAL | 1 refills | Status: DC | PRN

## 2019-11-04 NOTE — Telephone Encounter (Signed)
Please advise. Thank you

## 2019-11-04 NOTE — Telephone Encounter (Signed)
Wife called with an update on his condition. Separate message put in her chart.  She wanted to let us know that the last couple of days have been rough for him.  Had some SOB over the weekend but better today.  He had bad body aches, and vomiting and diarrhea over the weekend as well. Not as bad today.

## 2019-11-04 NOTE — Telephone Encounter (Signed)
It is not unusual for Covid because some pretty serious symptoms that can sort of come and go.  Nurses please talk with patient see how he is doing regarding his breathing.  Is not short of breath currently?  Vomiting diarrhea?  If he would like to do a virtual visit later today I can work that in

## 2019-11-04 NOTE — Telephone Encounter (Signed)
Left message to return call 

## 2019-11-04 NOTE — Telephone Encounter (Signed)
Pt contacted and states that he is still weak but better that last night. Pt has had diarrhea and vomiting. Pt had some labored breathing last night but none today. Pt would like to do a virtual visit with Dr.Scott this afternoon. Pt was placed on scheduled.

## 2019-11-04 NOTE — Progress Notes (Signed)
   Subjective:    Patient ID: Jeremy Riley, male    DOB: 12/23/1960, 58 y.o.   MRN: [id number]  HPI  Patient calls to discuss positive Covid results. Patient states the past 2 nights had a rough night with breathing but that is better today-feels little sick on stomach and cant eat much. Patient started off with more respiratory then he started developing some intermittent nausea and some diarrhea but now he started to have some difficulty at nighttime feeling a little bit short of breath and coughing denies being tachypneic does not feel short of breath when he sitting still states his appetite is subpar drinking liquids trying to label on some food does have risk factors Virtual Visit via Video Note  I connected with Jeremy Riley on 11/04/19 at  4:20 PM EST by a video enabled telemedicine application and verified that I am speaking with the correct person using two identifiers.  Location: Patient: home Provider: office   I discussed the limitations of evaluation and management by telemedicine and the availability of in person appointments. The patient expressed understanding and agreed to proceed.  History of Present Illness:    Observations/Objective:   Assessment and Plan:   Follow Up Instructions:    I discussed the assessment and treatment plan with the patient. The patient was provided an opportunity to ask questions and all were answered. The patient agreed with the plan and demonstrated an understanding of the instructions.   The patient was advised to call back or seek an in-person evaluation if the symptoms worsen or if the condition fails to improve as anticipated.  I provided 16 minutes of non-face-to-face time during this encounter.      Review of Systems  Constitutional: Negative for diaphoresis and fatigue.  HENT: Negative for congestion and rhinorrhea.   Respiratory: Positive for shortness of breath. Negative for cough.   Cardiovascular: Negative for  chest pain and leg swelling.  Gastrointestinal: Negative for abdominal pain and diarrhea.  Skin: Negative for color change and rash.  Neurological: Negative for dizziness and headaches.  Psychiatric/Behavioral: Negative for behavioral problems and confusion.       Objective:   Physical Exam Patient had virtual visit Appears to be in no distress Atraumatic Neuro able to relate and oriented No apparent resp distress Color normal        Assessment & Plan:  Covid infection Warning signs regarding shortness of breath discussed if he becomes more progressive shortness of breath recommend going to the ER Stay at home through the rest of this week Possibly back to work next week depends on his energy level.  Warning signs were discussed in detail

## 2019-11-08 ENCOUNTER — Other Ambulatory Visit: Payer: Self-pay

## 2019-11-08 ENCOUNTER — Ambulatory Visit (INDEPENDENT_AMBULATORY_CARE_PROVIDER_SITE_OTHER): Payer: BLUE CROSS/BLUE SHIELD | Admitting: Family Medicine

## 2019-11-08 DIAGNOSIS — U071 COVID-19: Secondary | ICD-10-CM

## 2019-11-08 DIAGNOSIS — J310 Chronic rhinitis: Secondary | ICD-10-CM

## 2019-11-08 DIAGNOSIS — J329 Chronic sinusitis, unspecified: Secondary | ICD-10-CM | POA: Diagnosis not present

## 2019-11-08 MED ORDER — AMOXICILLIN 500 MG PO CAPS: 500.0000 mg | ORAL_CAPSULE | Freq: Three times a day (TID) | ORAL | 0 refills | Status: DC

## 2019-11-08 NOTE — Progress Notes (Signed)
   Subjective:    Patient ID: Jeremy Riley, male    DOB: 19-Dec-1960, 58 y.o.   MRN: [id number]  HPI   Patient calls with a sore throat that started this am. Patient states that he finished his 10th day of quarantine for Covid and was released today but woke up with a terrible sore throat-patient states he never had sore throat with Covid. Patient thinks he needs an antibiotic to kick it out today.  Virtual Visit via Video Note  I connected with Jeremy Riley on 11/08/19 at  3:50 PM EST by a video enabled telemedicine application and verified that I am speaking with the correct person using two identifiers.  Location: Patient: home Provider: office   I discussed the limitations of evaluation and management by telemedicine and the availability of in person appointments. The patient expressed understanding and agreed to proceed.  History of Present Illness:    Observations/Objective:   Assessment and Plan:   Follow Up Instructions:    I discussed the assessment and treatment plan with the patient. The patient was provided an opportunity to ask questions and all were answered. The patient agreed with the plan and demonstrated an understanding of the instructions.   The patient was advised to call back or seek an in-person evaluation if the symptoms worsen or if the condition fails to improve as anticipated.  I provided81minutes of non-face-to-face time during this encounter.  Testing patient has known COVID-19.  Has struggled with an element of cough and shortness of breath.  States overall his features are improved considerably.  Now persistent sinus congestion.  Also very impressive sore throat.  Cough is improved  Thinks he may have a secondary throat or sinus infection no vomiting no diarrhea no rash    Objective:   Physical Exam  Virtual      Assessment & Plan:  Impression post COVID-19 secondary infection discussed antibiotics prescribed symptom care discussed  warning signs discussed other questions regarding COVID-19 answered  Greater than 50% of this 15 minute none face to face visit was spent in counseling and discussion and coordination of care regarding the above diagnosis/diagnosies

## 2019-11-10 ENCOUNTER — Encounter: Payer: Self-pay | Admitting: Family Medicine

## 2019-11-21 DIAGNOSIS — E119 Type 2 diabetes mellitus without complications: Secondary | ICD-10-CM | POA: Diagnosis not present

## 2019-11-30 ENCOUNTER — Other Ambulatory Visit: Payer: Self-pay | Admitting: Family Medicine

## 2019-12-02 NOTE — Telephone Encounter (Signed)
90-day on all 3 please

## 2020-01-29 ENCOUNTER — Other Ambulatory Visit: Payer: Self-pay | Admitting: Family Medicine

## 2020-02-20 ENCOUNTER — Ambulatory Visit: Payer: Self-pay | Attending: Internal Medicine

## 2020-02-20 DIAGNOSIS — Z23 Encounter for immunization: Secondary | ICD-10-CM

## 2020-02-20 NOTE — Progress Notes (Signed)
   Covid-19 Vaccination Clinic  Name:  Jeremy Riley    MRN: [id number] DOB: 1961-11-20  02/20/2020  Mr. Jeremy Riley was observed post Covid-19 immunization for 15 minutes without incident. He was provided with Vaccine Information Sheet and instruction to access the V-Safe system.   Mr. Jeremy Riley was instructed to call 911 with any severe reactions post vaccine: Marland Kitchen Difficulty breathing  . Swelling of face and throat  . A fast heartbeat  . A bad rash all over body  . Dizziness and weakness   Immunizations Administered    Name Date Dose VIS Date Route   Moderna COVID-19 Vaccine 02/20/2020  9:07 AM 0.5 mL 11/12/2019 Intramuscular   Manufacturer: Moderna   Lot: [id number]   [id number]

## 2020-03-23 ENCOUNTER — Other Ambulatory Visit: Payer: Self-pay | Admitting: Family Medicine

## 2020-03-24 ENCOUNTER — Ambulatory Visit: Payer: Self-pay | Attending: Internal Medicine

## 2020-03-24 ENCOUNTER — Telehealth: Payer: Self-pay | Admitting: Family Medicine

## 2020-03-24 DIAGNOSIS — Z23 Encounter for immunization: Secondary | ICD-10-CM

## 2020-03-24 NOTE — Telephone Encounter (Signed)
Left message to return call 

## 2020-03-24 NOTE — Telephone Encounter (Signed)
Actually I believe I sent the medication today came in as a refill Feel free to resend Keep follow-up visit

## 2020-03-24 NOTE — Telephone Encounter (Signed)
Patient is requesting refill on losartan-HCTZ 100-12.5 mg has follow up visit 04/14/2020 but is completely out of medication. [facility] [place]

## 2020-03-24 NOTE — Telephone Encounter (Signed)
Last seen 10/29/2019 for COVID. Please advise. Thank you

## 2020-03-24 NOTE — Progress Notes (Signed)
   Covid-19 Vaccination Clinic  Name:  Jeremy Riley    MRN: [id number] DOB: 1960/12/23  03/24/2020  Jeremy Riley was observed post Covid-19 immunization for 15 minutes without incident. He was provided with Vaccine Information Sheet and instruction to access the V-Safe system.   Jeremy Riley was instructed to call 911 with any severe reactions post vaccine: Marland Kitchen Difficulty breathing  . Swelling of face and throat  . A fast heartbeat  . A bad rash all over body  . Dizziness and weakness   Immunizations Administered    Name Date Dose VIS Date Route   Moderna COVID-19 Vaccine 03/24/2020  8:46 AM 0.5 mL 11/12/2019 Intramuscular   Manufacturer: Moderna   Lot: [id number]   [id number]

## 2020-03-25 NOTE — Telephone Encounter (Signed)
Schedule appointment for 5/4 for medication follow up

## 2020-04-01 ENCOUNTER — Telehealth (INDEPENDENT_AMBULATORY_CARE_PROVIDER_SITE_OTHER): Payer: BC Managed Care – PPO | Admitting: Cardiology

## 2020-04-01 ENCOUNTER — Encounter: Payer: Self-pay | Admitting: Cardiology

## 2020-04-01 VITALS — BP 117/77 | HR 89 | Ht 68.0 in | Wt 246.0 lb

## 2020-04-01 DIAGNOSIS — E782 Mixed hyperlipidemia: Secondary | ICD-10-CM | POA: Diagnosis not present

## 2020-04-01 DIAGNOSIS — I25119 Atherosclerotic heart disease of native coronary artery with unspecified angina pectoris: Secondary | ICD-10-CM | POA: Diagnosis not present

## 2020-04-01 DIAGNOSIS — I10 Essential (primary) hypertension: Secondary | ICD-10-CM

## 2020-04-01 MED ORDER — CARVEDILOL 3.125 MG PO TABS: 3.1250 mg | ORAL_TABLET | Freq: Two times a day (BID) | ORAL | 3 refills | Status: DC

## 2020-04-01 MED ORDER — TICAGRELOR 90 MG PO TABS: 90.0000 mg | ORAL_TABLET | Freq: Two times a day (BID) | ORAL | 2 refills | Status: DC

## 2020-04-01 MED ORDER — LOSARTAN POTASSIUM-HCTZ 100-12.5 MG PO TABS: 1.0000 | ORAL_TABLET | Freq: Every day | ORAL | 3 refills | Status: DC

## 2020-04-01 MED ORDER — ATORVASTATIN CALCIUM 80 MG PO TABS: 80.0000 mg | ORAL_TABLET | Freq: Every day | ORAL | 3 refills | Status: DC

## 2020-04-01 MED ORDER — NITROGLYCERIN 0.4 MG SL SUBL: 0.4000 mg | SUBLINGUAL_TABLET | SUBLINGUAL | 3 refills | Status: DC | PRN

## 2020-04-01 NOTE — Progress Notes (Signed)
Virtual Visit via Telephone Note   This visit type was conducted due to national recommendations for restrictions regarding the COVID-19 Pandemic (e.g. social distancing) in an effort to limit this patient's exposure and mitigate transmission in our community.  Due to his co-morbid illnesses, this patient is at least at moderate risk for complications without adequate follow up.  This format is felt to be most appropriate for this patient at this time.  The patient did not have access to video technology/had technical difficulties with video requiring transitioning to audio format only (telephone).  All issues noted in this document were discussed and addressed.  No physical exam could be performed with this format.  Please refer to the patient's chart for his  consent to telehealth for [facility].   The patient was identified using 2 identifiers.  Date:  04/01/2020   ID:  Jeremy Riley, DOB 1961/08/17, MRN [id number]  Patient Location: Home Provider Location: Office  PCP:  Kathyrn Drown, MD  Cardiologist:  Rozann Lesches, MD Electrophysiologist:  None   Evaluation Performed:  Follow-Up Visit  Chief Complaint:   Cardiac follow-up  History of Present Illness:    Jeremy Riley is a 59 y.o. male last assessed via telehealth encounter in September 2020.  We spoke by phone today.  He states that he has been doing well, still working full-time, does not report any active angina symptoms or nitroglycerin use.  He had COVID-19 in November 2020, indicates improvement back to baseline.  He has since had both coronavirus vaccine doses and is doing well.  We went over his cardiac medications as listed below.  Plan will be to continue Brilinta through the end of June and then discontinue.  He reports no side effects.  He does need refills on his medications.   Past Medical History:  Diagnosis Date  . Coronary artery disease    a. LHC 05/22/2019: 99% dCx s/p DES, luminal irregularities  in LAD and dominant RCA  . History of pneumonia as a child   . Hyperlipidemia   . Hypertension   . Lumbar disc disease    Fall from ladder  . Pre-diabetes    Hgb A1c 6.3 in 05/2019.   Past Surgical History:  Procedure Laterality Date  . CORONARY STENT INTERVENTION N/A 05/22/2019   Procedure: CORONARY STENT INTERVENTION;  Surgeon: Belva Crome, MD;  Location: [facility];  Service: Cardiovascular;  Laterality: N/A;  . LEFT HEART CATH AND CORONARY ANGIOGRAPHY N/A 05/22/2019   Procedure: LEFT HEART CATH AND CORONARY ANGIOGRAPHY;  Surgeon: Belva Crome, MD;  Location: [facility];  Service: Cardiovascular;  Laterality: N/A;     Current Meds  Medication Sig  . acetaminophen (TYLENOL) 500 MG tablet Take 500 mg by mouth every 6 (six) hours as needed.  Marland Kitchen aspirin 81 MG chewable tablet Chew 1 tablet (81 mg total) by mouth daily.  Marland Kitchen atorvastatin (LIPITOR) 80 MG tablet Take 1 tablet (80 mg total) by mouth daily at 6 PM.  . carvedilol (COREG) 3.125 MG tablet Take 1 tablet (3.125 mg total) by mouth 2 (two) times daily with a meal.  . losartan-hydrochlorothiazide (HYZAAR) 100-12.5 MG tablet Take 1 tablet by mouth daily.  . nitroGLYCERIN (NITROSTAT) 0.4 MG SL tablet Place 1 tablet (0.4 mg total) under the tongue every 5 (five) minutes x 3 doses as needed for chest pain.  . ticagrelor (BRILINTA) 90 MG TABS tablet Take 1 tablet (90 mg total) by mouth 2 (  two) times daily. (MAY STOP THE END OF June)  . [DISCONTINUED] atorvastatin (LIPITOR) 80 MG tablet TAKE 1 TABLET (80 MG TOTAL) BY MOUTH DAILY AT 6 PM.  . [DISCONTINUED] carvedilol (COREG) 3.125 MG tablet TAKE 1 TABLET (3.125 MG TOTAL) BY MOUTH 2 (TWO) TIMES DAILY WITH A MEAL.  . [DISCONTINUED] losartan-hydrochlorothiazide (HYZAAR) 100-12.5 MG tablet TAKE 1 TABLET BY MOUTH EVERY DAY  . [DISCONTINUED] nitroGLYCERIN (NITROSTAT) 0.4 MG SL tablet Place 1 tablet (0.4 mg total) under the tongue every 5 (five) minutes x 3 doses as needed for chest  pain.  . [DISCONTINUED] ticagrelor (BRILINTA) 90 MG TABS tablet Take 1 tablet (90 mg total) by mouth 2 (two) times daily.     Allergies:   Patient has no known allergies.   ROS:   No bleeding problems on dual antiplatelet therapy.  Prior CV studies:   The following studies were reviewed today:  Cardiac catheterization 05/22/2019:  Unstable angina pectoris  Successful DES implantation in distal circumflex reducing a culprit 99% stenosis to 0% using a 2.5 x 18 Onyx postdilated to 2.75 mm. TIMI grade III flow noted.  Normal left main.  Luminal irregularities in LAD  Luminal irregularities and dominant right coronary  RECOMMENDATIONS:   Aggressive secondary risk factor modification: Consider the diagnosis of sleep apnea; hemoglobin A1c should be less than 7; LDL cholesterol less than 70 and preferably 55 or less; blood pressure 130/80 mmHg or less; moderate aerobic activity; discontinue vaping.  Eligible for discharge in a.m. if no problems.  Labs/Other Tests and Data Reviewed:    EKG:  An ECG dated 08/20/2019 was personally reviewed today and demonstrated:  Sinus rhythm.  Recent Labs: 07/18/2019: ALT 33 08/20/2019: BUN 17; Creatinine, Ser 1.05; Hemoglobin 14.8; Magnesium 2.2; Platelets 235; Potassium 4.4; Sodium 142   Recent Lipid Panel Lab Results  Component Value Date/Time   CHOL 95 07/18/2019 08:39 AM   CHOL 194 09/08/2017 08:51 AM   TRIG 109 07/18/2019 08:39 AM   HDL 30 (L) 07/18/2019 08:39 AM   HDL 40 09/08/2017 08:51 AM   CHOLHDL 3.2 07/18/2019 08:39 AM   LDLCALC 43 07/18/2019 08:39 AM   LDLCALC 125 (H) 09/08/2017 08:51 AM    Wt Readings from Last 3 Encounters:  04/01/20 246 lb (111.6 kg)  09/09/19 235 lb (106.6 kg)  08/20/19 237 lb 10.5 oz (107.8 kg)     Objective:    Vital Signs:  BP 117/77   Pulse 89   Ht 5\' 8"  (1.727 m)   Wt 246 lb (111.6 kg)   BMI 37.40 kg/m    Patient spoke in full sentences, not short of breath. No audible wheezing or  coughing.  ASSESSMENT & PLAN:    1.  CAD status post DES to the distal circumflex with otherwise mild to moderate residual disease.  He is doing well without active angina on medical therapy and we will continue with observation.  Continue aspirin, Lipitor, Coreg, Hyzaar, and Brilinta.  He will discontinue Brilinta at the end of June.  Refills provided for medications.  2.  Mixed hyperlipidemia, tolerating high-dose Lipitor.  Last LDL was 43.  Keep routine follow-up with Dr. Wolfgang Phoenix.  3.  Essential hypertension, blood pressure is well controlled today.  No changes made to current medications.   Time:   Today, I have spent 5 minutes with the patient with telehealth technology discussing the above problems.     Medication Adjustments/Labs and Tests Ordered: Current medicines are reviewed at length with the patient today.  Concerns regarding medicines are outlined above.   Tests Ordered: No orders of the defined types were placed in this encounter.   Medication Changes: Meds ordered this encounter  Medications  . ticagrelor (BRILINTA) 90 MG TABS tablet    Sig: Take 1 tablet (90 mg total) by mouth 2 (two) times daily. (MAY STOP THE END OF June)    Dispense:  60 tablet    Refill:  2  . atorvastatin (LIPITOR) 80 MG tablet    Sig: Take 1 tablet (80 mg total) by mouth daily at 6 PM.    Dispense:  90 tablet    Refill:  3  . carvedilol (COREG) 3.125 MG tablet    Sig: Take 1 tablet (3.125 mg total) by mouth 2 (two) times daily with a meal.    Dispense:  180 tablet    Refill:  3  . nitroGLYCERIN (NITROSTAT) 0.4 MG SL tablet    Sig: Place 1 tablet (0.4 mg total) under the tongue every 5 (five) minutes x 3 doses as needed for chest pain.    Dispense:  25 tablet    Refill:  3  . losartan-hydrochlorothiazide (HYZAAR) 100-12.5 MG tablet    Sig: Take 1 tablet by mouth daily.    Dispense:  90 tablet    Refill:  3    Follow Up:  In Person 6 months in the [facility]  office.  Signed, Rozann Lesches, MD  04/01/2020 8:42 AM    Jeremy Riley

## 2020-04-01 NOTE — Patient Instructions (Addendum)
Medication Instructions:   All cardiac medications filled today.  Stop Brilinta the end of June.   Continue all other medications.    Labwork: none  Testing/Procedures: none  Follow-Up: 6 months   Any Other Special Instructions Will Be Listed Below (If Applicable).  If you need a refill on your cardiac medications before your next appointment, please call your pharmacy.

## 2020-04-14 ENCOUNTER — Encounter: Payer: Self-pay | Admitting: Family Medicine

## 2020-04-14 ENCOUNTER — Other Ambulatory Visit: Payer: Self-pay

## 2020-04-14 ENCOUNTER — Telehealth (INDEPENDENT_AMBULATORY_CARE_PROVIDER_SITE_OTHER): Payer: BC Managed Care – PPO | Admitting: Family Medicine

## 2020-04-14 ENCOUNTER — Telehealth: Payer: Self-pay | Admitting: *Deleted

## 2020-04-14 VITALS — BP 117/79 | Ht 68.0 in | Wt 245.0 lb

## 2020-04-14 DIAGNOSIS — I10 Essential (primary) hypertension: Secondary | ICD-10-CM

## 2020-04-14 DIAGNOSIS — R7303 Prediabetes: Secondary | ICD-10-CM | POA: Diagnosis not present

## 2020-04-14 DIAGNOSIS — E785 Hyperlipidemia, unspecified: Secondary | ICD-10-CM | POA: Diagnosis not present

## 2020-04-14 DIAGNOSIS — Z79899 Other long term (current) drug therapy: Secondary | ICD-10-CM | POA: Diagnosis not present

## 2020-04-14 DIAGNOSIS — Z125 Encounter for screening for malignant neoplasm of prostate: Secondary | ICD-10-CM

## 2020-04-14 NOTE — Progress Notes (Signed)
   Subjective:    Patient ID: Jeremy Riley, male    DOB: 1961/07/11, 59 y.o.   MRN: [id number]  Hypertension This is a chronic problem. Pertinent negatives include no chest pain, headaches or shortness of breath. Treatments tried: coreg, hyzaar. There are no compliance problems.    Pt states cardiology refilled all his meds last week and he has no concerns today.   Virtual Visit via Telephone Note  I connected with Jeremy Riley on 04/14/20 at 10:00 AM EDT by telephone and verified that I am speaking with the correct person using two identifiers.  Location: Patient: home Provider: office   I discussed the limitations, risks, security and privacy concerns of performing an evaluation and management service by telephone and the availability of in person appointments. I also discussed with the patient that there may be a patient responsible charge related to this service. The patient expressed understanding and agreed to proceed.   History of Present Illness:    Observations/Objective:   Assessment and Plan:   Follow Up Instructions:    I discussed the assessment and treatment plan with the patient. The patient was provided an opportunity to ask questions and all were answered. The patient agreed with the plan and demonstrated an understanding of the instructions.   The patient was advised to call back or seek an in-person evaluation if the symptoms worsen or if the condition fails to improve as anticipated.  I provided 20 minutes of non-face-to-face time during this encounter.       Review of Systems  Constitutional: Negative for diaphoresis and fatigue.  HENT: Negative for congestion and rhinorrhea.   Respiratory: Negative for cough and shortness of breath.   Cardiovascular: Negative for chest pain and leg swelling.  Gastrointestinal: Negative for abdominal pain and diarrhea.  Skin: Negative for color change and rash.  Neurological: Negative for dizziness and  headaches.  Psychiatric/Behavioral: Negative for behavioral problems and confusion.       Objective:      Today's visit was via telephone Physical exam was not possible for this visit      Assessment & Plan:  Patient doing well Blood pressure under good control Heart disease stable Patient will do lab work this summer and do a follow-up wellness exam

## 2020-04-14 NOTE — Telephone Encounter (Signed)
Jeremy Riley, Jeremy Riley are scheduled for a virtual visit with your provider today.    Just as we do with appointments in the office, we must obtain your consent to participate.  Your consent will be active for this visit and any virtual visit you may have with one of our providers in the next 365 days.    If you have a MyChart account, I can also send a copy of this consent to you electronically.  All virtual visits are billed to your insurance company just like a traditional visit in the office.  As this is a virtual visit, video technology does not allow for your provider to perform a traditional examination.  This may limit your provider's ability to fully assess your condition.  If your provider identifies any concerns that need to be evaluated in person or the need to arrange testing such as labs, EKG, etc, we will make arrangements to do so.    Although advances in technology are sophisticated, we cannot ensure that it will always work on either your end or our end.  If the connection with a video visit is poor, we may have to switch to a telephone visit.  With either a video or telephone visit, we are not always able to ensure that we have a secure connection.   I need to obtain your verbal consent now.   Are you willing to proceed with your visit today?   Jeremy Riley has provided verbal consent on 04/14/2020 for a virtual visit (video or telephone).   Dayton Bailiff, LPN [phone]  [phone]

## 2020-05-08 ENCOUNTER — Other Ambulatory Visit: Payer: Self-pay

## 2020-05-08 ENCOUNTER — Observation Stay (HOSPITAL_COMMUNITY)
Admission: EM | Admit: 2020-05-08 | Discharge: 2020-05-09 | Disposition: A | Discharge status: Home / Self Care | Payer: BC Managed Care – PPO | Attending: Family Medicine | Admitting: Family Medicine

## 2020-05-08 ENCOUNTER — Emergency Department (HOSPITAL_COMMUNITY): Payer: BC Managed Care – PPO

## 2020-05-08 ENCOUNTER — Encounter (HOSPITAL_COMMUNITY): Payer: Self-pay

## 2020-05-08 DIAGNOSIS — Z87891 Personal history of nicotine dependence: Secondary | ICD-10-CM | POA: Insufficient documentation

## 2020-05-08 DIAGNOSIS — Z955 Presence of coronary angioplasty implant and graft: Secondary | ICD-10-CM | POA: Insufficient documentation

## 2020-05-08 DIAGNOSIS — R7303 Prediabetes: Secondary | ICD-10-CM | POA: Diagnosis not present

## 2020-05-08 DIAGNOSIS — E785 Hyperlipidemia, unspecified: Secondary | ICD-10-CM | POA: Diagnosis not present

## 2020-05-08 DIAGNOSIS — Z20822 Contact with and (suspected) exposure to covid-19: Secondary | ICD-10-CM | POA: Insufficient documentation

## 2020-05-08 DIAGNOSIS — R079 Chest pain, unspecified: Secondary | ICD-10-CM | POA: Diagnosis not present

## 2020-05-08 DIAGNOSIS — Z8249 Family history of ischemic heart disease and other diseases of the circulatory system: Secondary | ICD-10-CM | POA: Diagnosis not present

## 2020-05-08 DIAGNOSIS — N179 Acute kidney failure, unspecified: Secondary | ICD-10-CM | POA: Diagnosis not present

## 2020-05-08 DIAGNOSIS — Z9861 Coronary angioplasty status: Secondary | ICD-10-CM

## 2020-05-08 DIAGNOSIS — Z79899 Other long term (current) drug therapy: Secondary | ICD-10-CM | POA: Diagnosis not present

## 2020-05-08 DIAGNOSIS — I10 Essential (primary) hypertension: Secondary | ICD-10-CM | POA: Diagnosis present

## 2020-05-08 DIAGNOSIS — Z7982 Long term (current) use of aspirin: Secondary | ICD-10-CM | POA: Diagnosis not present

## 2020-05-08 DIAGNOSIS — E876 Hypokalemia: Secondary | ICD-10-CM | POA: Diagnosis present

## 2020-05-08 DIAGNOSIS — Z7902 Long term (current) use of antithrombotics/antiplatelets: Secondary | ICD-10-CM | POA: Diagnosis not present

## 2020-05-08 DIAGNOSIS — I2510 Atherosclerotic heart disease of native coronary artery without angina pectoris: Secondary | ICD-10-CM | POA: Diagnosis not present

## 2020-05-08 LAB — CBC
HCT: 42.6 % (ref 39.0–52.0)
Hemoglobin: 14.9 g/dL (ref 13.0–17.0)
MCH: 31 pg (ref 26.0–34.0)
MCHC: 35 g/dL (ref 30.0–36.0)
MCV: 88.6 fL (ref 80.0–100.0)
Platelets: 254 10*3/uL (ref 150–400)
RBC: 4.81 MIL/uL (ref 4.22–5.81)
RDW: 12.6 % (ref 11.5–15.5)
WBC: 9.4 10*3/uL (ref 4.0–10.5)
nRBC: 0 % (ref 0.0–0.2)

## 2020-05-08 LAB — TROPONIN I (HIGH SENSITIVITY)
Troponin I (High Sensitivity): 3 ng/L (ref <18)
Troponin I (High Sensitivity): 4 ng/L (ref <18)

## 2020-05-08 LAB — BASIC METABOLIC PANEL
Anion gap: 12 (ref 5–15)
BUN: 19 mg/dL (ref 6–20)
CO2: 23 mmol/L (ref 22–32)
Calcium: 9.3 mg/dL (ref 8.9–10.3)
Chloride: 105 mmol/L (ref 98–111)
Creatinine, Ser: 1.4 mg/dL — ABNORMAL HIGH (ref 0.61–1.24)
GFR calc Af Amer: >60 mL/min (ref >60)
GFR calc non Af Amer: 55 mL/min — ABNORMAL LOW (ref >60)
Glucose, Bld: 158 mg/dL — ABNORMAL HIGH (ref 70–99)
Potassium: 3.2 mmol/L — ABNORMAL LOW (ref 3.5–5.1)
Sodium: 140 mmol/L (ref 135–145)

## 2020-05-08 MED ORDER — SODIUM CHLORIDE 0.9% FLUSH
3.0000 mL | Freq: Once | INTRAVENOUS | Status: AC
  Administered 2020-05-09: 3 mL via INTRAVENOUS

## 2020-05-08 NOTE — ED Triage Notes (Signed)
Pt reports that he has CP and SOB that has been going on all week, today pain is worse. Pain is worse on exertion. Denies n/v, pain radiates to neck.

## 2020-05-09 ENCOUNTER — Other Ambulatory Visit: Payer: Self-pay

## 2020-05-09 ENCOUNTER — Encounter (HOSPITAL_COMMUNITY): Payer: Self-pay | Admitting: Internal Medicine

## 2020-05-09 DIAGNOSIS — I2510 Atherosclerotic heart disease of native coronary artery without angina pectoris: Secondary | ICD-10-CM

## 2020-05-09 DIAGNOSIS — Z9861 Coronary angioplasty status: Secondary | ICD-10-CM | POA: Diagnosis not present

## 2020-05-09 DIAGNOSIS — R079 Chest pain, unspecified: Secondary | ICD-10-CM

## 2020-05-09 DIAGNOSIS — I10 Essential (primary) hypertension: Secondary | ICD-10-CM

## 2020-05-09 DIAGNOSIS — E876 Hypokalemia: Secondary | ICD-10-CM | POA: Diagnosis not present

## 2020-05-09 LAB — BASIC METABOLIC PANEL
Anion gap: 11 (ref 5–15)
BUN: 16 mg/dL (ref 6–20)
CO2: 25 mmol/L (ref 22–32)
Calcium: 9.2 mg/dL (ref 8.9–10.3)
Chloride: 106 mmol/L (ref 98–111)
Creatinine, Ser: 1.25 mg/dL — ABNORMAL HIGH (ref 0.61–1.24)
GFR calc Af Amer: >60 mL/min (ref >60)
GFR calc non Af Amer: >60 mL/min (ref >60)
Glucose, Bld: 120 mg/dL — ABNORMAL HIGH (ref 70–99)
Potassium: 3.6 mmol/L (ref 3.5–5.1)
Sodium: 142 mmol/L (ref 135–145)

## 2020-05-09 LAB — SARS CORONAVIRUS 2 BY RT PCR (HOSPITAL ORDER, PERFORMED IN ~~LOC~~ HOSPITAL LAB): SARS Coronavirus 2: NEGATIVE

## 2020-05-09 MED ORDER — LOSARTAN POTASSIUM 50 MG PO TABS
100.0000 mg | ORAL_TABLET | Freq: Every day | ORAL | Status: DC
  Administered 2020-05-09: 100 mg via ORAL
  Filled 2020-05-09: qty 2

## 2020-05-09 MED ORDER — ACETAMINOPHEN 325 MG PO TABS: 650.0000 mg | ORAL_TABLET | ORAL | Status: DC | PRN

## 2020-05-09 MED ORDER — ATORVASTATIN CALCIUM 80 MG PO TABS: 80.0000 mg | ORAL_TABLET | Freq: Every day | ORAL | Status: DC

## 2020-05-09 MED ORDER — SODIUM CHLORIDE 0.9 % IV BOLUS
1000.0000 mL | Freq: Once | INTRAVENOUS | Status: AC
  Administered 2020-05-09: 1000 mL via INTRAVENOUS

## 2020-05-09 MED ORDER — POTASSIUM CHLORIDE CRYS ER 20 MEQ PO TBCR
20.0000 meq | EXTENDED_RELEASE_TABLET | Freq: Once | ORAL | Status: AC
  Administered 2020-05-09: 20 meq via ORAL
  Filled 2020-05-09: qty 1

## 2020-05-09 MED ORDER — ENOXAPARIN SODIUM 40 MG/0.4ML ~~LOC~~ SOLN
40.0000 mg | SUBCUTANEOUS | Status: DC
  Filled 2020-05-09: qty 0.4

## 2020-05-09 MED ORDER — ONDANSETRON HCL 4 MG/2ML IJ SOLN: 4.0000 mg | Freq: Four times a day (QID) | INTRAMUSCULAR | Status: DC | PRN

## 2020-05-09 MED ORDER — TICAGRELOR 90 MG PO TABS
90.0000 mg | ORAL_TABLET | Freq: Two times a day (BID) | ORAL | Status: DC
  Administered 2020-05-09: 90 mg via ORAL
  Filled 2020-05-09: qty 1

## 2020-05-09 MED ORDER — NITROGLYCERIN 0.4 MG SL SUBL: 0.4000 mg | SUBLINGUAL_TABLET | SUBLINGUAL | Status: DC | PRN

## 2020-05-09 MED ORDER — CARVEDILOL 3.125 MG PO TABS
3.1250 mg | ORAL_TABLET | Freq: Two times a day (BID) | ORAL | Status: DC
  Administered 2020-05-09: 3.125 mg via ORAL
  Filled 2020-05-09 (×2): qty 1

## 2020-05-09 MED ORDER — ASPIRIN 81 MG PO CHEW
81.0000 mg | CHEWABLE_TABLET | Freq: Every day | ORAL | Status: DC
  Administered 2020-05-09: 81 mg via ORAL
  Filled 2020-05-09: qty 1

## 2020-05-09 MED ORDER — LOSARTAN POTASSIUM-HCTZ 100-12.5 MG PO TABS: 1.0000 | ORAL_TABLET | Freq: Every day | ORAL | 0 refills | Status: DC

## 2020-05-09 NOTE — ED Notes (Signed)
Attempted to call report no one answering on [facility]

## 2020-05-09 NOTE — H&P (Signed)
History and Physical    Jeremy Riley [id number] DOB: 07/17/1961 DOA: 05/08/2020  PCP: Kathyrn Drown, MD  Patient coming from: Home.  Chief Complaint: Chest pain.  HPI: Jeremy Riley is a 59 y.o. male with history of CAD status post stenting, hypertension presents to the ER with complaint of chest pain.  Patient was playing golf yesterday around evening when patient started having chest pain across the chest nonradiating with no associated shortness of breath productive cough fever chills or nausea vomiting.  Patient still went and take some rest following which the chest pain subsided.  Patient presents to the ER.  ED Course: In the ER patient was chest pain-free EKG shows normal sinus rhythm high sensitive troponins were negative patient was afebrile not hypoxic chest x-ray EKG were unremarkable.  Given the history of CAD patient admitted for further cardiac work-up.  Review of Systems: As per HPI, rest all negative.   Past Medical History:  Diagnosis Date  . Coronary artery disease    a. LHC 05/22/2019: 99% dCx s/p DES, luminal irregularities in LAD and dominant RCA  . History of pneumonia as a child   . Hyperlipidemia   . Hypertension   . Lumbar disc disease    Fall from ladder  . Pre-diabetes    Hgb A1c 6.3 in 05/2019.    Past Surgical History:  Procedure Laterality Date  . CORONARY STENT INTERVENTION N/A 05/22/2019   Procedure: CORONARY STENT INTERVENTION;  Surgeon: Belva Crome, MD;  Location: [place] [facility];  Service: Cardiovascular;  Laterality: N/A;  . LEFT HEART CATH AND CORONARY ANGIOGRAPHY N/A 05/22/2019   Procedure: LEFT HEART CATH AND CORONARY ANGIOGRAPHY;  Surgeon: Belva Crome, MD;  Location: [place] [facility];  Service: Cardiovascular;  Laterality: N/A;     reports that he quit smoking about 11 years ago. His smoking use included cigarettes. His smokeless tobacco use includes chew. He reports current alcohol use. He reports previous drug  use.  No Known Allergies  Family History  Problem Relation Age of Onset  . Heart disease Father        CABG age 58's    Prior to Admission medications   Medication Sig Start Date End Date Taking? Authorizing Provider  acetaminophen (TYLENOL) 500 MG tablet Take 500 mg by mouth every 6 (six) hours as needed for mild pain or fever.    Yes [provider]  aspirin 81 MG chewable tablet Chew 1 tablet (81 mg total) by mouth daily. 05/24/19  Yes Sande Rives E, PA-C  atorvastatin (LIPITOR) 80 MG tablet Take 1 tablet (80 mg total) by mouth daily at 6 PM. 04/01/20  Yes Satira Sark, MD  carvedilol (COREG) 3.125 MG tablet Take 1 tablet (3.125 mg total) by mouth 2 (two) times daily with a meal. 04/01/20  Yes Satira Sark, MD  DM-Doxylamine-Acetaminophen (NYQUIL HBP COLD & FLU) 15-6.25-325 MG/15ML LIQD Take 15 mLs by mouth as needed (for cough).   Yes [provider]  losartan-hydrochlorothiazide (HYZAAR) 100-12.5 MG tablet Take 1 tablet by mouth daily. 04/01/20  Yes Satira Sark, MD  nitroGLYCERIN (NITROSTAT) 0.4 MG SL tablet Place 1 tablet (0.4 mg total) under the tongue every 5 (five) minutes x 3 doses as needed for chest pain. 04/01/20  Yes Satira Sark, MD  ticagrelor (BRILINTA) 90 MG TABS tablet Take 1 tablet (90 mg total) by mouth 2 (two) times daily. (MAY STOP THE END OF June) 04/01/20  Yes Domenic Polite,  Aloha Gell, MD    Physical Exam: Constitutional: Moderately built and nourished. Vitals:   05/08/20 2343 05/09/20 0030 05/09/20 0200 05/09/20 0300  BP: (!) 156/93 (!) 149/85 126/80 107/73  Pulse: 68 (!) 59 (!) 59 (!) 59  Resp: 17 13 11 13   Temp:      TempSrc:      SpO2: 98% 95% 99% 96%   Eyes: Anicteric no pallor. ENMT: No discharge from the ears eyes nose or mouth. Neck: No mass felt.  No neck rigidity. Respiratory: No rhonchi or crepitations. Cardiovascular: S1-S2 heard. Abdomen: Soft nontender bowel sounds present. Musculoskeletal: No  edema. Skin: No rash. Neurologic: Alert awake oriented to time place and person.  Moves all extremities. Psychiatric: Appears normal.  Normal affect.   Labs on Admission: I have personally reviewed following labs and imaging studies  CBC: Recent Labs  Lab 05/08/20 1944  WBC 9.4  HGB 14.9  HCT 42.6  MCV 88.6  PLT 0000000   Basic Metabolic Panel: Recent Labs  Lab 05/08/20 1944  NA 140  K 3.2*  CL 105  CO2 23  GLUCOSE 158*  BUN 19  CREATININE 1.40*  CALCIUM 9.3   GFR: CrCl cannot be calculated (Unknown ideal weight.). Liver Function Tests: No results for input(s): AST, ALT, ALKPHOS, BILITOT, PROT, ALBUMIN in the last 168 hours. No results for input(s): LIPASE, AMYLASE in the last 168 hours. No results for input(s): AMMONIA in the last 168 hours. Coagulation Profile: No results for input(s): INR, PROTIME in the last 168 hours. Cardiac Enzymes: No results for input(s): CKTOTAL, CKMB, CKMBINDEX, TROPONINI in the last 168 hours. BNP (last 3 results) No results for input(s): PROBNP in the last 8760 hours. HbA1C: No results for input(s): HGBA1C in the last 72 hours. CBG: No results for input(s): GLUCAP in the last 168 hours. Lipid Profile: No results for input(s): CHOL, HDL, LDLCALC, TRIG, CHOLHDL, LDLDIRECT in the last 72 hours. Thyroid Function Tests: No results for input(s): TSH, T4TOTAL, FREET4, T3FREE, THYROIDAB in the last 72 hours. Anemia Panel: No results for input(s): VITAMINB12, FOLATE, FERRITIN, TIBC, IRON, RETICCTPCT in the last 72 hours. Urine analysis:    Component Value Date/Time   COLORURINE YELLOW 05/21/2008 0638   APPEARANCEUR CLEAR 05/21/2008 0638   LABSPEC >1.030 (H) 05/21/2008 0638   PHURINE 5.0 05/21/2008 0638   GLUCOSEU NEGATIVE 05/21/2008 0638   HGBUR LARGE (A) 05/21/2008 0638   BILIRUBINUR NEGATIVE 05/21/2008 0638   KETONESUR NEGATIVE 05/21/2008 0638   PROTEINUR NEGATIVE 05/21/2008 0638   UROBILINOGEN 0.2 05/21/2008 0638   NITRITE NEGATIVE  05/21/2008 0638   LEUKOCYTESUR Negative 02/27/2018 1622   Sepsis Labs: @LABRCNTIP (procalcitonin:4,lacticidven:4) ) Recent Results (from the past 240 hour(s))  SARS Coronavirus 2 by RT PCR (hospital order, performed in [facility] hospital lab) Nasopharyngeal Nasopharyngeal Swab     Status: None   Collection Time: 05/09/20  1:48 AM   Specimen: Nasopharyngeal Swab  Result Value Ref Range Status   SARS Coronavirus 2 NEGATIVE NEGATIVE Final    Comment: (NOTE) SARS-CoV-2 target nucleic acids are NOT DETECTED. The SARS-CoV-2 RNA is generally detectable in upper and lower respiratory specimens during the acute phase of infection. The lowest concentration of SARS-CoV-2 viral copies this assay can detect is 250 copies / mL. A negative result does not preclude SARS-CoV-2 infection and should not be used as the sole basis for treatment or other patient management decisions.  A negative result may occur with improper specimen collection / handling, submission of specimen other than nasopharyngeal  swab, presence of viral mutation(s) within the areas targeted by this assay, and inadequate number of viral copies (<250 copies / mL). A negative result must be combined with clinical observations, patient history, and epidemiological information. Fact Sheet for Patients:   StrictlyIdeas.no Fact Sheet for Healthcare Providers: BankingDealers.co.za This test is not yet approved or cleared  by the [place] FDA and has been authorized for detection and/or diagnosis of SARS-CoV-2 by FDA under an Emergency Use Authorization (EUA).  This EUA will remain in effect (meaning this test can be used) for the duration of the COVID-19 declaration under Section 564(b)(1) of the Act, 21 U.S.C. section 360bbb-3(b)(1), unless the authorization is terminated or revoked sooner. Performed at [facility], [place] [address] [place], [place] [address]       Radiological Exams on Admission: DG Chest 2 View  Result Date: 05/08/2020 CLINICAL DATA:  59 year old male with chest pain. EXAM: CHEST - 2 VIEW COMPARISON:  Chest radiograph dated 08/19/2019 FINDINGS: No focal consolidation, pleural effusion, pneumothorax. The cardiac silhouette is within limits. No acute osseous pathology. IMPRESSION: No active cardiopulmonary disease. Electronically Signed   By: Anner Crete M.D.   On: 05/08/2020 20:08    EKG: Independently reviewed.  Normal sinus rhythm.  Assessment/Plan Principal Problem:   Chest pain Active Problems:   HTN (hypertension)   Hypokalemia    1. Chest pain with history of CAD status post stenting last year in June presently chest pain-free.  Cardiology has been notified.  Continue aspirin Brilinta beta-blocker and statin.  N.p.o. except medication for in anticipation of procedure. 2. Hypertension on losartan and metoprolol.  Hydrochlorothiazide hold due to mild hypokalemia. 3. Hypokalemia replace and recheck.  Could be from hydrochlorothiazide.  Hydrochlorothiazide on hold.   DVT prophylaxis: Lovenox. Code Status: Full code. Family Communication: Family at the bedside. Disposition Plan: Home. Consults called: Cardiology. Admission status: Observation.   Rise Patience MD [facility] Pager [phone].  If 7PM-7AM, please contact night-coverage [email or web address] Password [facility]  05/09/2020, 3:27 AM

## 2020-05-09 NOTE — ED Provider Notes (Signed)
[facility] EMERGENCY DEPARTMENT Provider Note   CSN: [id number] Arrival date & time: 05/08/20  1918     History Chief Complaint  Patient presents with  . Chest Pain    Jeremy Riley is a 59 y.o. male.  The history is provided by the patient and medical records.    59 year old male with history of coronary artery disease status post DES stent to circumflex in June 2020, hyperlipidemia, hypertension, prediabetes, presenting to the ED with chest pain.  Patient reports over the past week he has had intermittent pain.  States it feels different than the pain he was experiencing previously, this is more of a "tingling" sensation across the chest.  He states it does seem to move around a bit.  Symptoms are random in onset, can occur with exertion or rest. When this occurs he does get short of breath but has not had any diaphoresis, nausea, or vomiting.  He does report playing round of golf today which is not unusual for him, however after returning home he did not feel right, checked his blood pressure and it was significantly elevated.  States in the past when he has had cardiac issues his blood pressure will be elevated like this.  Does report over the past 2 weeks he has been working a little more than normal, states when climbing stairs last week he did get very lightheaded and short of breath, had to stop and rest for about 30 minutes before he could continue further.  He has been compliant with all of his medications including his antiplatelet therapy.  He is followed by cardiology, Dr. Domenic Polite, has not notified him of his current symptoms.  Patient is due to go on vacation in 2 days and was concerned with the current symptoms he is having.  No current chest pain or SOB.  Past Medical History:  Diagnosis Date  . Coronary artery disease    a. LHC 05/22/2019: 99% dCx s/p DES, luminal irregularities in LAD and dominant RCA  . History of pneumonia as a child   .  Hyperlipidemia   . Hypertension   . Lumbar disc disease    Fall from ladder  . Pre-diabetes    Hgb A1c 6.3 in 05/2019.    Patient Active Problem List   Diagnosis Date Noted  . Hyperlipidemia 05/23/2019  . Pre-diabetes 05/23/2019  . CAD (coronary artery disease) 05/23/2019  . Unstable angina ([place])   . Chest pain 05/21/2019  . Hypertensive urgency 05/21/2019  . Hypokalemia 05/21/2019  . HTN (hypertension) 10/03/2017    Past Surgical History:  Procedure Laterality Date  . CORONARY STENT INTERVENTION N/A 05/22/2019   Procedure: CORONARY STENT INTERVENTION;  Surgeon: Belva Crome, MD;  Location: [place] [facility];  Service: Cardiovascular;  Laterality: N/A;  . LEFT HEART CATH AND CORONARY ANGIOGRAPHY N/A 05/22/2019   Procedure: LEFT HEART CATH AND CORONARY ANGIOGRAPHY;  Surgeon: Belva Crome, MD;  Location: [place] [facility];  Service: Cardiovascular;  Laterality: N/A;       Family History  Problem Relation Age of Onset  . Heart disease Father        CABG age 108's    Social History   Tobacco Use  . Smoking status: Former Smoker    Types: Cigarettes    Quit date: 01/08/2009    Years since quitting: 11.3  . Smokeless tobacco: Current User    Types: Chew  Substance Use Topics  . Alcohol use: Yes  Comment: occ  . Drug use: Not Currently    Home Medications Prior to Admission medications   Medication Sig Start Date End Date Taking? Authorizing Provider  acetaminophen (TYLENOL) 500 MG tablet Take 500 mg by mouth every 6 (six) hours as needed.    [provider]  aspirin 81 MG chewable tablet Chew 1 tablet (81 mg total) by mouth daily. 05/24/19   Darreld Mclean, PA-C  atorvastatin (LIPITOR) 80 MG tablet Take 1 tablet (80 mg total) by mouth daily at 6 PM. 04/01/20   Satira Sark, MD  carvedilol (COREG) 3.125 MG tablet Take 1 tablet (3.125 mg total) by mouth 2 (two) times daily with a meal. 04/01/20   Satira Sark, MD  losartan-hydrochlorothiazide  (HYZAAR) 100-12.5 MG tablet Take 1 tablet by mouth daily. 04/01/20   Satira Sark, MD  nitroGLYCERIN (NITROSTAT) 0.4 MG SL tablet Place 1 tablet (0.4 mg total) under the tongue every 5 (five) minutes x 3 doses as needed for chest pain. 04/01/20   Satira Sark, MD  ticagrelor (BRILINTA) 90 MG TABS tablet Take 1 tablet (90 mg total) by mouth 2 (two) times daily. (MAY STOP THE END OF June) 04/01/20   Satira Sark, MD    Allergies    Patient has no known allergies.  Review of Systems   Review of Systems  Cardiovascular: Positive for chest pain.  All other systems reviewed and are negative.   Physical Exam Updated Vital Signs BP (!) 156/93 (BP Location: Right Arm)   Pulse 68   Temp 98.4 F (36.9 C) (Oral)   Resp 17   SpO2 98%   Physical Exam Vitals and nursing note reviewed.  Constitutional:      Appearance: He is well-developed.  HENT:     Head: Normocephalic and atraumatic.  Eyes:     Conjunctiva/sclera: Conjunctivae normal.     Pupils: Pupils are equal, round, and reactive to light.  Neck:     Comments: No JVD Cardiovascular:     Rate and Rhythm: Normal rate and regular rhythm.     Heart sounds: Normal heart sounds.  Pulmonary:     Effort: Pulmonary effort is normal.     Breath sounds: Normal breath sounds. No decreased breath sounds, wheezing or rhonchi.  Abdominal:     General: Bowel sounds are normal.     Palpations: Abdomen is soft.  Musculoskeletal:        General: Normal range of motion.     Cervical back: Normal range of motion.     Comments: No LE edema  Skin:    General: Skin is warm and dry.  Neurological:     Mental Status: He is alert and oriented to person, place, and time.     ED Results / Procedures / Treatments   Labs (all labs ordered are listed, but only abnormal results are displayed) Labs Reviewed  BASIC METABOLIC PANEL - Abnormal; Notable for the following components:      Result Value   Potassium 3.2 (*)    Glucose, Bld  158 (*)    Creatinine, Ser 1.40 (*)    GFR calc non Af Amer 55 (*)    All other components within normal limits  SARS CORONAVIRUS 2 BY RT PCR (HOSPITAL ORDER, [place] [facility])  CBC  TROPONIN I (HIGH SENSITIVITY)  TROPONIN I (HIGH SENSITIVITY)    EKG EKG Interpretation  Date/Time:  Friday May 08 2020 19:22:36 EDT Ventricular Rate:  85 PR Interval:  150 QRS Duration: 100 QT Interval:  380 QTC Calculation: 452 R Axis:   44 Text Interpretation: Normal sinus rhythm Normal ECG No significant change since last tracing Confirmed by Ripley Fraise [phone]) on 05/09/2020 12:02:04 AM   Radiology DG Chest 2 View  Result Date: 05/08/2020 CLINICAL DATA:  59 year old male with chest pain. EXAM: CHEST - 2 VIEW COMPARISON:  Chest radiograph dated 08/19/2019 FINDINGS: No focal consolidation, pleural effusion, pneumothorax. The cardiac silhouette is within limits. No acute osseous pathology. IMPRESSION: No active cardiopulmonary disease. Electronically Signed   By: Anner Crete M.D.   On: 05/08/2020 20:08    Procedures Procedures (including critical care time)  Medications Ordered in ED Medications  sodium chloride flush (NS) 0.9 % injection 3 mL (has no administration in time range)    ED Course  I have reviewed the triage vital signs and the nursing notes.  Pertinent labs & imaging results that were available during my care of the patient were reviewed by me and considered in my medical decision making (see chart for details).    MDM Rules/Calculators/A&P  59 year old male presenting to the ED with chest pain.  Has been having intermittent exertional symptoms over the past week, worse today after playing a round of golf.  Does report some shortness of breath and lightheadedness when symptoms occur.  He does have history of left circumflex occlusion in June 2020, status post DES.  Has been largely asymptomatic for several months until now.  His EKG today is  unchanged.  Initial labs are reassuring.  Troponin is negative x2.  Patient's symptoms are concerning.  On chart review, even with his prior occlusion his troponins were always negative.  Giving return of exertional symptoms after several months of asymptomatic activity, will discuss with cardiology.  He remains asymptomatic here in the ED.  Discussed with cards fellow, Dr. Vickki Muff --team will see patient in the morning.  Hospitalist can admit.  Discussed with Dr. Hal Hope-- will admit for ongoing care.  Final Clinical Impression(s) / ED Diagnoses Final diagnoses:  Chest pain, unspecified type    Rx / DC Orders ED Discharge Orders    None       Larene Pickett, PA-C 05/09/20 [id number]    Ripley Fraise, MD 05/09/20 [phone]

## 2020-05-09 NOTE — Progress Notes (Signed)
Pt discharge instructions provided at bedside with pt and pt wife  Pt has all belongings Pt finished 1 L bolus per MD request IV removed, catheter intact and telemetry removed  Pt denies wheelchair Pt ambulated off unit with RN

## 2020-05-09 NOTE — ED Provider Notes (Signed)
Patient seen/examined in the Emergency Department in conjunction with Advanced Practice Provider [name]  Patient reports recent increase in chest pain shortness of breath above his baseline.  He is currently chest pain-free Exam : Awake alert, no acute distress.  No cardiac murmurs.  Lungs are clear Plan: he  Will Need to be admitted for cardiac evaluation.  Patient agreed with plan    Ripley Fraise, MD 05/09/20 [phone]

## 2020-05-09 NOTE — Discharge Instructions (Signed)

## 2020-05-09 NOTE — Discharge Summary (Signed)
Physician Discharge Summary  Jeremy Riley [id number] DOB: [birth year] DOA: 05/08/2020  PCP: Kathyrn Drown, MD  Admit date: 05/08/2020 Discharge date: 05/09/2020  Admitted From: Home Disposition: Home  Recommendations for Outpatient Follow-up:  1. Follow up with PCP in 1-2 weeks 2. Follow with cardiology 3. Please obtain BMP/CBC in one week 4. Please follow up on the following pending results:  Home Health: None Equipment/Devices: None  Discharge Condition: Stable CODE STATUS: Full code Diet recommendation: Cardiac  Subjective: Seen and examined.  Feels much better.  No more chest pain.  Eager to go home.  Brief/Interim Summary: Jeremy Riley is a 59 year old gentleman with a history of CAD status post stenting, hypertension presented with chest pain while he was playing golf..  Pain was nonradiating, sharp, lasted for few seconds and was not associated with any nausea, sweating and shortness of breath.  Upon arrival to ED, he was hemodynamically stable.  EKG with no acute ST-T wave changes.  He was already chest pain-free.  Troponin was negative.  He was admitted under hospital service for further work-up.  He was ruled out of MI with negative cardiac enzymes.  He was seen by cardiology who cleared him for discharge.  Patient never had any chest pain after admission.  He also had mild AKI.  He received some IV fluids.  Repeat BMP shows improved creatinine.  He received 1 more liter of fluid after his recent lab with creatinine of 1.25.  He is on losartan hydrochlorothiazide at home.  All his home medications are being resumed at discharge to be taken today however his hydrochlorothiazide and losartan is being resumed starting tomorrow hoping that after giving him 1 L of fluid and encouraging him to drink more, his renal function should be normal by tomorrow.  He will follow with PCP.  Discharge Diagnoses:  Principal Problem:   Chest pain Active Problems:   HTN (hypertension)    Hypokalemia   CAD S/P percutaneous coronary angioplasty    Discharge Instructions   Allergies as of 05/09/2020   No Known Allergies     Medication List    TAKE these medications   acetaminophen 500 MG tablet Commonly known as: TYLENOL Take 500 mg by mouth every 6 (six) hours as needed for mild pain or fever.   aspirin 81 MG chewable tablet Chew 1 tablet (81 mg total) by mouth daily.   atorvastatin 80 MG tablet Commonly known as: LIPITOR Take 1 tablet (80 mg total) by mouth daily at 6 PM.   carvedilol 3.125 MG tablet Commonly known as: COREG Take 1 tablet (3.125 mg total) by mouth 2 (two) times daily with a meal.   losartan-hydrochlorothiazide 100-12.5 MG tablet Commonly known as: HYZAAR Take 1 tablet by mouth daily. Start taking on: May 10, 2020   nitroGLYCERIN 0.4 MG SL tablet Commonly known as: NITROSTAT Place 1 tablet (0.4 mg total) under the tongue every 5 (five) minutes x 3 doses as needed for chest pain.   NyQuil HBP Cold & Flu 15-6.25-325 MG/15ML Liqd Generic drug: DM-Doxylamine-Acetaminophen Take 15 mLs by mouth as needed (for cough).   ticagrelor 90 MG Tabs tablet Commonly known as: BRILINTA Take 1 tablet (90 mg total) by mouth 2 (two) times daily. (MAY STOP THE END OF June)      Follow-up Information    Luking, Elayne Snare, MD Follow up in 1 week(s).   Specialty: Family Medicine Contact information: [address] [place] [address] [phone]  Satira Sark, MD .   Specialty: Cardiology Contact information: [place] [address] [phone]          No Known Allergies  Consultations: Cardiology   Procedures/Studies: DG Chest 2 View  Result Date: 05/08/2020 CLINICAL DATA:  59 year old male with chest pain. EXAM: CHEST - 2 VIEW COMPARISON:  Chest radiograph dated 08/19/2019 FINDINGS: No focal consolidation, pleural effusion, pneumothorax. The cardiac silhouette is within limits. No acute  osseous pathology. IMPRESSION: No active cardiopulmonary disease. Electronically Signed   By: Anner Crete M.D.   On: 05/08/2020 20:08     Discharge Exam: Vitals:   05/09/20 0442 05/09/20 0802  BP:  (!) 134/95  Pulse: 64 61  Resp:  15  Temp: 98.7 F (37.1 C) 97.9 F (36.6 C)  SpO2:  93%   Vitals:   05/09/20 0400 05/09/20 0442 05/09/20 0802 05/09/20 0819  BP: (!) 149/95  (!) 134/95   Pulse: 64 64 61   Resp:   15   Temp: 98.7 F (37.1 C) 98.7 F (37.1 C) 97.9 F (36.6 C)   TempSrc:  Oral Oral   SpO2: 97%  93%   Height:    5\' 8"  (1.727 m)    General: Pt is alert, awake, not in acute distress Cardiovascular: RRR, S1/S2 +, no rubs, no gallops Respiratory: CTA bilaterally, no wheezing, no rhonchi Abdominal: Soft, NT, ND, bowel sounds + Extremities: no edema, no cyanosis    The results of significant diagnostics from this hospitalization (including imaging, microbiology, ancillary and laboratory) are listed below for reference.     Microbiology: Recent Results (from the past 240 hour(s))  SARS Coronavirus 2 by RT PCR (hospital order, performed in [facility] hospital lab) Nasopharyngeal Nasopharyngeal Swab     Status: None   Collection Time: 05/09/20  1:48 AM   Specimen: Nasopharyngeal Swab  Result Value Ref Range Status   SARS Coronavirus 2 NEGATIVE NEGATIVE Final    Comment: (NOTE) SARS-CoV-2 target nucleic acids are NOT DETECTED. The SARS-CoV-2 RNA is generally detectable in upper and lower respiratory specimens during the acute phase of infection. The lowest concentration of SARS-CoV-2 viral copies this assay can detect is 250 copies / mL. A negative result does not preclude SARS-CoV-2 infection and should not be used as the sole basis for treatment or other patient management decisions.  A negative result may occur with improper specimen collection / handling, submission of specimen other than nasopharyngeal swab, presence of viral mutation(s) within  the areas targeted by this assay, and inadequate number of viral copies (<250 copies / mL). A negative result must be combined with clinical observations, patient history, and epidemiological information. Fact Sheet for Patients:   StrictlyIdeas.no Fact Sheet for Healthcare Providers: BankingDealers.co.za This test is not yet approved or cleared  by the [place] FDA and has been authorized for detection and/or diagnosis of SARS-CoV-2 by FDA under an Emergency Use Authorization (EUA).  This EUA will remain in effect (meaning this test can be used) for the duration of the COVID-19 declaration under Section 564(b)(1) of the Act, 21 U.S.C. section 360bbb-3(b)(1), unless the authorization is terminated or revoked sooner. Performed at [facility], [place] [address], [place], [place] [address]      Labs: BNP (last 3 results) No results for input(s): BNP in the last 8760 hours. Basic Metabolic Panel: Recent Labs  Lab 05/08/20 1944 05/09/20 0838  NA 140 142  K 3.2* 3.6  CL 105 106  CO2  23 25  GLUCOSE 158* 120*  BUN 19 16  CREATININE 1.40* 1.25*  CALCIUM 9.3 9.2   Liver Function Tests: No results for input(s): AST, ALT, ALKPHOS, BILITOT, PROT, ALBUMIN in the last 168 hours. No results for input(s): LIPASE, AMYLASE in the last 168 hours. No results for input(s): AMMONIA in the last 168 hours. CBC: Recent Labs  Lab 05/08/20 1944  WBC 9.4  HGB 14.9  HCT 42.6  MCV 88.6  PLT 254   Cardiac Enzymes: No results for input(s): CKTOTAL, CKMB, CKMBINDEX, TROPONINI in the last 168 hours. BNP: Invalid input(s): POCBNP CBG: No results for input(s): GLUCAP in the last 168 hours. D-Dimer No results for input(s): DDIMER in the last 72 hours. Hgb A1c No results for input(s): HGBA1C in the last 72 hours. Lipid Profile No results for input(s): CHOL, HDL, LDLCALC, TRIG, CHOLHDL, LDLDIRECT in the last 72 hours. Thyroid function  studies No results for input(s): TSH, T4TOTAL, T3FREE, THYROIDAB in the last 72 hours.  Invalid input(s): FREET3 Anemia work up No results for input(s): VITAMINB12, FOLATE, FERRITIN, TIBC, IRON, RETICCTPCT in the last 72 hours. Urinalysis    Component Value Date/Time   COLORURINE YELLOW 05/21/2008 0638   APPEARANCEUR CLEAR 05/21/2008 0638   LABSPEC >1.030 (H) 05/21/2008 0638   PHURINE 5.0 05/21/2008 0638   GLUCOSEU NEGATIVE 05/21/2008 0638   HGBUR LARGE (A) 05/21/2008 0638   BILIRUBINUR NEGATIVE 05/21/2008 0638   KETONESUR NEGATIVE 05/21/2008 0638   PROTEINUR NEGATIVE 05/21/2008 0638   UROBILINOGEN 0.2 05/21/2008 0638   NITRITE NEGATIVE 05/21/2008 0638   LEUKOCYTESUR Negative 02/27/2018 1622   Sepsis Labs Invalid input(s): PROCALCITONIN,  WBC,  LACTICIDVEN Microbiology Recent Results (from the past 240 hour(s))  SARS Coronavirus 2 by RT PCR (hospital order, performed in [facility] hospital lab) Nasopharyngeal Nasopharyngeal Swab     Status: None   Collection Time: 05/09/20  1:48 AM   Specimen: Nasopharyngeal Swab  Result Value Ref Range Status   SARS Coronavirus 2 NEGATIVE NEGATIVE Final    Comment: (NOTE) SARS-CoV-2 target nucleic acids are NOT DETECTED. The SARS-CoV-2 RNA is generally detectable in upper and lower respiratory specimens during the acute phase of infection. The lowest concentration of SARS-CoV-2 viral copies this assay can detect is 250 copies / mL. A negative result does not preclude SARS-CoV-2 infection and should not be used as the sole basis for treatment or other patient management decisions.  A negative result may occur with improper specimen collection / handling, submission of specimen other than nasopharyngeal swab, presence of viral mutation(s) within the areas targeted by this assay, and inadequate number of viral copies (<250 copies / mL). A negative result must be combined with clinical observations, patient history, and epidemiological  information. Fact Sheet for Patients:   StrictlyIdeas.no Fact Sheet for Healthcare Providers: BankingDealers.co.za This test is not yet approved or cleared  by the [place] FDA and has been authorized for detection and/or diagnosis of SARS-CoV-2 by FDA under an Emergency Use Authorization (EUA).  This EUA will remain in effect (meaning this test can be used) for the duration of the COVID-19 declaration under Section 564(b)(1) of the Act, 21 U.S.C. section 360bbb-3(b)(1), unless the authorization is terminated or revoked sooner. Performed at [facility], [place] [address], [place], [place] [address]      Time coordinating discharge: Over 30 minutes  SIGNED:   Darliss Cheney, MD  [facility] 05/09/2020, 11:48 AM  If 7PM-7AM, please contact night-coverage [email or web address]

## 2020-05-09 NOTE — Consult Note (Signed)
Cardiology Consultation:   Patient ID: ALIAN Riley MRN: [id number]; DOB: 02/25/1961  Admit date: 05/08/2020 Date of Consult: 05/09/2020  Primary Care Provider: Kathyrn Drown, MD Primary Cardiologist: Rozann Lesches, MD  Primary Electrophysiologist:  None    Patient Profile:   Jeremy Riley is a 59 y.o. male with a hx of CAD s/p DES to LCx 05/2019, HTN, and HLD who is being seen today for the evaluation of chest pain at the request of Dr. Doristine Bosworth.  History of Present Illness:   Mr. Stegman presented with NSTEMI in 05/2019 found to have occluded Cx treated with DES, residual disease treated medically. CE at that time were negative. He was placed on ASA and brilinta. He presented back 08/2019 for atypical chest pain and ruled out - suspicion for MSK pain at that time. He follows with Dr. Domenic Polite and was last seen 04/01/20 and was doing well at that time without angina. Of note, he had COVID-19 infection last fall. He has since been vaccinated for COVID-19.   He presented to [facility] with complaints of chest pain on 05/09/20. He describes pins and needles in "spots" not across his chest, without radiation, and without associated symptoms. Episodes last seconds and can happen several times per day. He has felt these sensations for the past week. He also noted elevated BP in the 140s, which has been concerning for him. He is active and walks several miles daily with his job without chest pain. He also golfs (walks and rides) and walked up 35 steps yesterday without chest pain. He did not consider taking a nitro for these episodes because he didn't consider the CP "heart attack pain."  Pain may be worse with deep inspiration but is not changed with position or upper body movements. He mainly came in to get this evaluated because they are leaving for the beach tomorrow and he didn't want to go to the ER out of town.   HS troponin x 2 negative EKG nonischemic CXR with    Past Medical History:    Diagnosis Date  . Coronary artery disease    a. LHC 05/22/2019: 99% dCx s/p DES, luminal irregularities in LAD and dominant RCA  . History of pneumonia as a child   . Hyperlipidemia   . Hypertension   . Lumbar disc disease    Fall from ladder  . Pre-diabetes    Hgb A1c 6.3 in 05/2019.    Past Surgical History:  Procedure Laterality Date  . CORONARY STENT INTERVENTION N/A 05/22/2019   Procedure: CORONARY STENT INTERVENTION;  Surgeon: Belva Crome, MD;  Location: [facility];  Service: Cardiovascular;  Laterality: N/A;  . LEFT HEART CATH AND CORONARY ANGIOGRAPHY N/A 05/22/2019   Procedure: LEFT HEART CATH AND CORONARY ANGIOGRAPHY;  Surgeon: Belva Crome, MD;  Location: [facility];  Service: Cardiovascular;  Laterality: N/A;     Home Medications:  Prior to Admission medications   Medication Sig Start Date End Date Taking? Authorizing Provider  acetaminophen (TYLENOL) 500 MG tablet Take 500 mg by mouth every 6 (six) hours as needed for mild pain or fever.    Yes [provider]  aspirin 81 MG chewable tablet Chew 1 tablet (81 mg total) by mouth daily. 05/24/19  Yes Sande Rives E, PA-C  atorvastatin (LIPITOR) 80 MG tablet Take 1 tablet (80 mg total) by mouth daily at 6 PM. 04/01/20  Yes Satira Sark, MD  carvedilol (COREG) 3.125 MG tablet Take 1  tablet (3.125 mg total) by mouth 2 (two) times daily with a meal. 04/01/20  Yes Satira Sark, MD  DM-Doxylamine-Acetaminophen (NYQUIL HBP COLD & FLU) 15-6.25-325 MG/15ML LIQD Take 15 mLs by mouth as needed (for cough).   Yes [provider]  losartan-hydrochlorothiazide (HYZAAR) 100-12.5 MG tablet Take 1 tablet by mouth daily. 04/01/20  Yes Satira Sark, MD  nitroGLYCERIN (NITROSTAT) 0.4 MG SL tablet Place 1 tablet (0.4 mg total) under the tongue every 5 (five) minutes x 3 doses as needed for chest pain. 04/01/20  Yes Satira Sark, MD  ticagrelor (BRILINTA) 90 MG TABS tablet Take 1 tablet (90  mg total) by mouth 2 (two) times daily. (MAY STOP THE END OF June) 04/01/20  Yes Satira Sark, MD    Inpatient Medications: Scheduled Meds: . aspirin  81 mg Oral Daily  . atorvastatin  80 mg Oral q1800  . carvedilol  3.125 mg Oral BID WC  . enoxaparin (LOVENOX) injection  40 mg Subcutaneous Q24H  . losartan  100 mg Oral Daily  . ticagrelor  90 mg Oral BID   Continuous Infusions: . sodium chloride     PRN Meds: acetaminophen, nitroGLYCERIN, ondansetron (ZOFRAN) IV  Allergies:   No Known Allergies  Social History:   Social History   Socioeconomic History  . Marital status: Married    Spouse name: Not on file  . Number of children: Not on file  . Years of education: Not on file  . Highest education level: Not on file  Occupational History  . Not on file  Tobacco Use  . Smoking status: Former Smoker    Types: Cigarettes    Quit date: 01/08/2009    Years since quitting: 11.3  . Smokeless tobacco: Current User    Types: Chew  Substance and Sexual Activity  . Alcohol use: Yes    Comment: occ  . Drug use: Not Currently  . Sexual activity: Not on file  Other Topics Concern  . Not on file  Social History Narrative  . Not on file   Social Determinants of Health   Financial Resource Strain:   . Difficulty of Paying Living Expenses:   Food Insecurity:   . Worried About Charity fundraiser in the Last Year:   . Arboriculturist in the Last Year:   Transportation Needs:   . Film/video editor (Medical):   Marland Kitchen Lack of Transportation (Non-Medical):   Physical Activity:   . Days of Exercise per Week:   . Minutes of Exercise per Session:   Stress:   . Feeling of Stress :   Social Connections:   . Frequency of Communication with Friends and Family:   . Frequency of Social Gatherings with Friends and Family:   . Attends Religious Services:   . Active Member of Clubs or Organizations:   . Attends Archivist Meetings:   Marland Kitchen Marital Status:   Intimate  Partner Violence:   . Fear of Current or Ex-Partner:   . Emotionally Abused:   Marland Kitchen Physically Abused:   . Sexually Abused:     Family History:    Family History  Problem Relation Age of Onset  . Heart disease Father        CABG age 26's     ROS:  Please see the history of present illness.   All other ROS reviewed and negative.     Physical Exam/Data:   Vitals:   05/09/20 0400 05/09/20 0442 05/09/20  0802 05/09/20 0819  BP: (!) 149/95  (!) 134/95   Pulse: 64 64 61   Resp:   15   Temp: 98.7 F (37.1 C) 98.7 F (37.1 C) 97.9 F (36.6 C)   TempSrc:  Oral Oral   SpO2: 97%  93%   Height:    5\' 8"  (1.727 m)    Intake/Output Summary (Last 24 hours) at 05/09/2020 1031 Last data filed at 05/09/2020 [id number] Gross per 24 hour  Intake 60 ml  Output --  Net 60 ml   Last 3 Weights 04/14/2020 04/01/2020 09/09/2019  Weight (lbs) 245 lb 246 lb 235 lb  Weight (kg) 111.131 kg 111.585 kg 106.595 kg     Body mass index is 37.25 kg/m.  General:  Mildly obese male in NAD HEENT: normal Neck: no JVD Vascular: No carotid bruits Cardiac:  normal S1, S2; RRR; no murmur  Lungs:  clear to auscultation bilaterally, no wheezing, rhonchi or rales  Abd: soft, nontender, no hepatomegaly  Ext: no edema Musculoskeletal:  No deformities, BUE and BLE strength normal and equal Skin: warm and dry  Neuro:  CNs 2-12 intact, no focal abnormalities noted Psych:  Normal affect   EKG:  The EKG was personally reviewed and demonstrates:  Sinus rhythm HR 85 Telemetry:  Telemetry was personally reviewed and demonstrates:  Sinus to sinus bradycardia HR 40-60s  Relevant CV Studies:  Heart cath 05/22/19:  Unstable angina pectoris  Successful DES implantation in distal circumflex reducing a culprit 99% stenosis to 0% using a 2.5 x 18 Onyx postdilated to 2.75 mm.  TIMI grade III flow noted.  Normal left main.  Luminal irregularities in LAD  Luminal irregularities and dominant right  coronary  RECOMMENDATIONS:   Aggressive secondary risk factor modification: Consider the diagnosis of sleep apnea; hemoglobin A1c should be less than 7; LDL cholesterol less than 70 and preferably 55 or less; blood pressure 130/80 mmHg or less; moderate aerobic activity; discontinue vaping.  Eligible for discharge in a.m. if no problems.   Laboratory Data:  High Sensitivity Troponin:   Recent Labs  Lab 05/08/20 1944 05/08/20 2155  TROPONINIHS 3 4     Chemistry Recent Labs  Lab 05/08/20 1944 05/09/20 0838  NA 140 142  K 3.2* 3.6  CL 105 106  CO2 23 25  GLUCOSE 158* 120*  BUN 19 16  CREATININE 1.40* 1.25*  CALCIUM 9.3 9.2  GFRNONAA 55* >60  GFRAA >60 >60  ANIONGAP 12 11    No results for input(s): PROT, ALBUMIN, AST, ALT, ALKPHOS, BILITOT in the last 168 hours. Hematology Recent Labs  Lab 05/08/20 1944  WBC 9.4  RBC 4.81  HGB 14.9  HCT 42.6  MCV 88.6  MCH 31.0  MCHC 35.0  RDW 12.6  PLT 254   BNPNo results for input(s): BNP, PROBNP in the last 168 hours.  DDimer No results for input(s): DDIMER in the last 168 hours.   Radiology/Studies:  DG Chest 2 View  Result Date: 05/08/2020 CLINICAL DATA:  59 year old male with chest pain. EXAM: CHEST - 2 VIEW COMPARISON:  Chest radiograph dated 08/19/2019 FINDINGS: No focal consolidation, pleural effusion, pneumothorax. The cardiac silhouette is within limits. No acute osseous pathology. IMPRESSION: No active cardiopulmonary disease. Electronically Signed   By: Anner Crete M.D.   On: 05/08/2020 20:08       HEAR Score (for undifferentiated chest pain):  HEAR Score: 3    Assessment and Plan:   Chest pain CAD s/p DES-dCx - pt still  on ASA and brilinta - hs troponin x 2 negative - CE were also negative at the time of his cath last year - EKG nonischemic - he describes atypical chest pain that is not like his prior anginal pain - unclear etiology, but may be related to elevated BP, low suspicion for ACS -  discussed outpatient evaluation with nuclear stress test - continue ASA and brilinta, statin   Hypertension - pressures have been somewhat labile - he is compliant on medications: BB, losartan-HCTZ   Hyperlipidemia with LDL goal less than 70 07/18/2019: Cholesterol 95; HDL 30; LDL Cholesterol 43; Triglycerides 109; VLDL 22 - continue statin - well controlled       For questions or updates, please contact [facility] Please consult [email or web address] for contact info under     Signed, Ledora Bottcher, PA  05/09/2020 10:31 AM

## 2020-05-10 ENCOUNTER — Telehealth: Payer: Self-pay | Admitting: Family Medicine

## 2020-05-10 NOTE — Telephone Encounter (Signed)
Nurse's-patient recently discharged from the hospital. Please call patient, let them know that we are aware that they were discharged from the hospital. Please schedule them to follow-up with [place] within the next 7 days. Advised the patient to bring all of their medications with him to the visit. Please inquire if they are having any acute issues currently and documented accordingly.  Nurses-patient was in the hospital for chest pain is recommended for him to follow-up within 7 to 10 days.  Please connect with him offer him an appointment for follow-up either this week or next week. (If he chooses not to follow-up please document accordingly-patient will also be following up with cardiology)

## 2020-05-12 NOTE — Telephone Encounter (Signed)
Patient notified of results and scheduled for appointment next week.

## 2020-05-12 NOTE — Telephone Encounter (Signed)
Left message to return call 

## 2020-05-18 ENCOUNTER — Ambulatory Visit (INDEPENDENT_AMBULATORY_CARE_PROVIDER_SITE_OTHER): Payer: BC Managed Care – PPO | Admitting: Family Medicine

## 2020-05-18 ENCOUNTER — Other Ambulatory Visit: Payer: Self-pay

## 2020-05-18 ENCOUNTER — Encounter: Payer: Self-pay | Admitting: Family Medicine

## 2020-05-18 VITALS — BP 114/80 | HR 90 | Temp 98.1°F | Wt 246.8 lb

## 2020-05-18 DIAGNOSIS — I10 Essential (primary) hypertension: Secondary | ICD-10-CM | POA: Diagnosis not present

## 2020-05-18 DIAGNOSIS — R7303 Prediabetes: Secondary | ICD-10-CM

## 2020-05-18 DIAGNOSIS — E785 Hyperlipidemia, unspecified: Secondary | ICD-10-CM | POA: Diagnosis not present

## 2020-05-18 DIAGNOSIS — M545 Low back pain, unspecified: Secondary | ICD-10-CM | POA: Insufficient documentation

## 2020-05-18 DIAGNOSIS — Z125 Encounter for screening for malignant neoplasm of prostate: Secondary | ICD-10-CM

## 2020-05-18 DIAGNOSIS — R5383 Other fatigue: Secondary | ICD-10-CM

## 2020-05-18 DIAGNOSIS — Z79899 Other long term (current) drug therapy: Secondary | ICD-10-CM

## 2020-05-18 HISTORY — DX: Low back pain, unspecified: M54.50

## 2020-05-18 MED ORDER — TRAMADOL HCL 50 MG PO TABS: ORAL_TABLET | ORAL | 0 refills | Status: DC

## 2020-05-18 NOTE — Progress Notes (Signed)
Subjective:    Patient ID: Jeremy Riley, male    DOB: [birth year], 59 y.o.   MRN: [id number]  HPI Patient comes in today for hospital follow up for chest pain. Patient states he was feeling a tingling in his chest but has not had any issues since.  Patient was in the hospital because of chest pain had lab work done which was negative for heart attack had a consult by cardiology they did not feel any hospital testing was necessary they released him to go home Patient does relate some shortness of breath with activity if he exerts himself also occasional sharp chest pains He is trying to be relatively healthy with his eating trying to lose weight Covid vaccines complete. Review of Systems  Constitutional: Negative for diaphoresis and fatigue.  HENT: Negative for congestion and rhinorrhea.   Respiratory: Negative for cough and shortness of breath.   Cardiovascular: Negative for chest pain and leg swelling.  Gastrointestinal: Negative for abdominal pain and diarrhea.  Skin: Negative for color change and rash.  Neurological: Negative for dizziness and headaches.  Psychiatric/Behavioral: Negative for behavioral problems and confusion.       Objective:   Physical Exam Vitals reviewed.  Constitutional:      General: He is not in acute distress. HENT:     Head: Normocephalic and atraumatic.  Eyes:     General:        Right eye: No discharge.        Left eye: No discharge.  Neck:     Trachea: No tracheal deviation.  Cardiovascular:     Rate and Rhythm: Normal rate and regular rhythm.     Heart sounds: Normal heart sounds. No murmur.  Pulmonary:     Effort: Pulmonary effort is normal. No respiratory distress.     Breath sounds: Normal breath sounds.  Lymphadenopathy:     Cervical: No cervical adenopathy.  Skin:    General: Skin is warm and dry.  Neurological:     Mental Status: He is alert.     Coordination: Coordination normal.  Psychiatric:        Behavior: Behavior  normal.           Assessment & Plan:  Transitional care Still having some sharp pains in the chest but no substernal chest tightness pressure pain does relate getting out of breath with walking up flights of steps and doing heavy work has a history of a stent last year  Not scheduled to see cardiology until October  Recent lab work showed renal function elevated need to repeat this. Patient also needs up-to-date on some of his other labs Refills of medications given Healthy diet regular physical activity recommended Nutritional consult recommended  I do not feel patient needs catheterization at this point but possible that he might benefit from a stress Myoview  Blood pressure good control Tramadol as needed for intermittent lower back pain  Will assist patient with getting in with cardiology sooner  1. Essential hypertension Blood pressure good control continue current measures check metabolic 7 - Basic metabolic panel  2. Hyperlipidemia, unspecified hyperlipidemia type Hyperlipidemia continue current medication watch diet stay active check labs - Lipid panel  3. Pre-diabetes Prediabetes minimize starches in the diet stay physically active - CBC with Differential/Platelet  4. High risk medication use Liver function to be tested because of obesity and prediabetes - Hepatic function panel  5. Fatigue, unspecified type Fatigue related into condition and weight check labs - Basic  metabolic panel - CBC with Differential/Platelet  6. Screening for prostate cancer Screening for prostate cancer indicated - PSA  7. Lumbar pain Patient does have intermittent low back pain uses tramadol rarely to help him with discomfort prescription given patient responsible with use  Significant obesity patient questions whether or not a plan called [employer] could help him looking into this my feeling is he would be best served by following a DASH diet watching portions and staying  physically active

## 2020-05-21 DIAGNOSIS — E785 Hyperlipidemia, unspecified: Secondary | ICD-10-CM | POA: Diagnosis not present

## 2020-05-21 DIAGNOSIS — Z125 Encounter for screening for malignant neoplasm of prostate: Secondary | ICD-10-CM | POA: Diagnosis not present

## 2020-05-21 DIAGNOSIS — R5383 Other fatigue: Secondary | ICD-10-CM | POA: Diagnosis not present

## 2020-05-21 DIAGNOSIS — Z79899 Other long term (current) drug therapy: Secondary | ICD-10-CM | POA: Diagnosis not present

## 2020-05-21 DIAGNOSIS — I10 Essential (primary) hypertension: Secondary | ICD-10-CM | POA: Diagnosis not present

## 2020-05-22 LAB — CBC WITH DIFFERENTIAL/PLATELET
Basophils Absolute: 0.1 10*3/uL (ref 0.0–0.2)
Basos: 1 %
EOS (ABSOLUTE): 0.3 10*3/uL (ref 0.0–0.4)
Eos: 4 %
Hematocrit: 44.1 % (ref 37.5–51.0)
Hemoglobin: 14.9 g/dL (ref 13.0–17.7)
Immature Grans (Abs): 0 10*3/uL (ref 0.0–0.1)
Immature Granulocytes: 0 %
Lymphocytes Absolute: 1.8 10*3/uL (ref 0.7–3.1)
Lymphs: 24 %
MCH: 30.2 pg (ref 26.6–33.0)
MCHC: 33.8 g/dL (ref 31.5–35.7)
MCV: 89 fL (ref 79–97)
Monocytes Absolute: 0.6 10*3/uL (ref 0.1–0.9)
Monocytes: 8 %
Neutrophils Absolute: 4.8 10*3/uL (ref 1.4–7.0)
Neutrophils: 63 %
Platelets: 225 10*3/uL (ref 150–450)
RBC: 4.94 x10E6/uL (ref 4.14–5.80)
RDW: 13.2 % (ref 11.6–15.4)
WBC: 7.6 10*3/uL (ref 3.4–10.8)

## 2020-05-22 LAB — HEPATIC FUNCTION PANEL
ALT: 30 IU/L (ref 0–44)
AST: 26 IU/L (ref 0–40)
Albumin: 4.7 g/dL (ref 3.8–4.9)
Alkaline Phosphatase: 58 IU/L (ref 48–121)
Bilirubin Total: 1 mg/dL (ref 0.0–1.2)
Bilirubin, Direct: 0.22 mg/dL (ref 0.00–0.40)
Total Protein: 7.3 g/dL (ref 6.0–8.5)

## 2020-05-22 LAB — BASIC METABOLIC PANEL
BUN/Creatinine Ratio: 16 (ref 9–20)
BUN: 20 mg/dL (ref 6–24)
CO2: 24 mmol/L (ref 20–29)
Calcium: 9.5 mg/dL (ref 8.7–10.2)
Chloride: 103 mmol/L (ref 96–106)
Creatinine, Ser: 1.24 mg/dL (ref 0.76–1.27)
GFR calc Af Amer: 73 mL/min/{1.73_m2} (ref >59)
GFR calc non Af Amer: 63 mL/min/{1.73_m2} (ref >59)
Glucose: 121 mg/dL — ABNORMAL HIGH (ref 65–99)
Potassium: 4 mmol/L (ref 3.5–5.2)
Sodium: 141 mmol/L (ref 134–144)

## 2020-05-22 LAB — PSA: Prostate Specific Ag, Serum: 2.4 ng/mL (ref 0.0–4.0)

## 2020-05-22 LAB — LIPID PANEL
Chol/HDL Ratio: 3 ratio (ref 0.0–5.0)
Cholesterol, Total: 114 mg/dL (ref 100–199)
HDL: 38 mg/dL — ABNORMAL LOW (ref >39)
LDL Chol Calc (NIH): 56 mg/dL (ref 0–99)
Triglycerides: 106 mg/dL (ref 0–149)
VLDL Cholesterol Cal: 20 mg/dL (ref 5–40)

## 2020-05-26 ENCOUNTER — Encounter: Payer: Self-pay | Admitting: Student

## 2020-05-26 ENCOUNTER — Other Ambulatory Visit: Payer: Self-pay

## 2020-05-26 ENCOUNTER — Ambulatory Visit: Payer: BC Managed Care – PPO | Admitting: Student

## 2020-05-26 VITALS — BP 124/82 | HR 72 | Ht 68.0 in | Wt 246.6 lb

## 2020-05-26 DIAGNOSIS — I2510 Atherosclerotic heart disease of native coronary artery without angina pectoris: Secondary | ICD-10-CM | POA: Diagnosis not present

## 2020-05-26 DIAGNOSIS — I10 Essential (primary) hypertension: Secondary | ICD-10-CM

## 2020-05-26 DIAGNOSIS — E785 Hyperlipidemia, unspecified: Secondary | ICD-10-CM | POA: Diagnosis not present

## 2020-05-26 MED ORDER — LOSARTAN POTASSIUM-HCTZ 100-12.5 MG PO TABS: 1.0000 | ORAL_TABLET | Freq: Every day | ORAL | 3 refills | Status: DC

## 2020-05-26 NOTE — Progress Notes (Signed)
Cardiology Office Note    Date:  05/26/2020   ID:  Jeremy Riley, DOB 03/19/1961, MRN [id number]  PCP:  Kathyrn Drown, MD  Cardiologist: Rozann Lesches, MD    Chief Complaint  Patient presents with   Follow-up    recent Emergency Dept visit    History of Present Illness:    Jeremy Riley is a 59 y.o. male with past medical history of CAD (s/p DES to distal LCx in 05/2019), HTN, HLD, chronic back pain, and prior tobacco abuse who presents to the office today for hospital follow-up.   He most recently had a telehealth visit with Dr. Domenic Polite in 03/2020 and denied any recent chest pain or dyspnea on exertion. It was recommended to continue Brilinta through the end of June and then discontinue. He was continued on ASA, Lipitor, Coreg and Hyzaar.  In the interim, he presented to [facility] ED on 05/09/2020 for evaluation of a "pins and needle" sensation along his chest without associated symptoms. He had walked up 35 steps the previous day without any anginal symptoms. HS troponin values were negative and his EKG showed no acute ischemic changes. His symptoms were overall felt to be atypical for a cardiac etiology and outpatient follow-up was recommended.  In talking with the patient today, he reports overall doing well since recent ED evaluation. Reports the symptoms he experienced that day did not resemble when he required stent placement in the past. He has been very active at his job and reports climbing flights of steps yesterday without any recurrent chest pain. He does experience an occasional tingling sensation along his chest but this is not associated with exertion, positional changes or food consumption. He denies any specific orthopnea, PND or edema. BP has been well controlled when checked at home and was at 124/82 when checked in the office today.   Past Medical History:  Diagnosis Date   Coronary artery disease    a. LHC 05/22/2019: 99% dCx s/p DES, luminal  irregularities in LAD and dominant RCA   History of pneumonia as a child    Hyperlipidemia    Hypertension    Lumbar disc disease    Fall from ladder   Lumbar pain 05/18/2020   Patient with known history of sciatica.  On multiple medicines for his heart cannot take anti-inflammatories, intermittent use of tramadol prescribed   Pre-diabetes    Hgb A1c 6.3 in 05/2019.    Past Surgical History:  Procedure Laterality Date   CORONARY STENT INTERVENTION N/A 05/22/2019   Procedure: CORONARY STENT INTERVENTION;  Surgeon: Belva Crome, MD;  Location: [facility];  Service: Cardiovascular;  Laterality: N/A;   LEFT HEART CATH AND CORONARY ANGIOGRAPHY N/A 05/22/2019   Procedure: LEFT HEART CATH AND CORONARY ANGIOGRAPHY;  Surgeon: Belva Crome, MD;  Location: [facility];  Service: Cardiovascular;  Laterality: N/A;    Current Medications: Outpatient Medications Prior to Visit  Medication Sig Dispense Refill   acetaminophen (TYLENOL) 500 MG tablet Take 500 mg by mouth every 6 (six) hours as needed for mild pain or fever.      aspirin 81 MG chewable tablet Chew 1 tablet (81 mg total) by mouth daily.     atorvastatin (LIPITOR) 80 MG tablet Take 1 tablet (80 mg total) by mouth daily at 6 PM. 90 tablet 3   carvedilol (COREG) 3.125 MG tablet Take 1 tablet (3.125 mg total) by mouth 2 (two) times daily with a meal. 180  tablet 3   nitroGLYCERIN (NITROSTAT) 0.4 MG SL tablet Place 1 tablet (0.4 mg total) under the tongue every 5 (five) minutes x 3 doses as needed for chest pain. 25 tablet 3   ticagrelor (BRILINTA) 90 MG TABS tablet Take 1 tablet (90 mg total) by mouth 2 (two) times daily. (MAY STOP THE END OF June) 60 tablet 2   traMADol (ULTRAM) 50 MG tablet 1 Q8h PRN 15 tablet 0   losartan-hydrochlorothiazide (HYZAAR) 100-12.5 MG tablet Take 1 tablet by mouth daily. 90 tablet 0   DM-Doxylamine-Acetaminophen (NYQUIL HBP COLD & FLU) 15-6.25-325 MG/15ML LIQD Take 15 mLs by mouth  as needed (for cough).     No facility-administered medications prior to visit.     Allergies:   Patient has no known allergies.   Social History   Socioeconomic History   Marital status: Married    Spouse name: Not on file   Number of children: Not on file   Years of education: Not on file   Highest education level: Not on file  Occupational History   Not on file  Tobacco Use   Smoking status: Former Smoker    Types: Cigarettes    Quit date: 01/08/2009    Years since quitting: 11.3   Smokeless tobacco: Current User    Types: Database administrator Use   Vaping Use: Every day   Substances: Nicotine, Flavoring  Substance and Sexual Activity   Alcohol use: Yes    Comment: occ   Drug use: Not Currently   Sexual activity: Not on file  Other Topics Concern   Not on file  Social History Narrative   Not on file   Social Determinants of Health   Financial Resource Strain:    Difficulty of Paying Living Expenses:   Food Insecurity:    Worried About Charity fundraiser in the Last Year:    Arboriculturist in the Last Year:   Transportation Needs:    Film/video editor (Medical):    Lack of Transportation (Non-Medical):   Physical Activity:    Days of Exercise per Week:    Minutes of Exercise per Session:   Stress:    Feeling of Stress :   Social Connections:    Frequency of Communication with Friends and Family:    Frequency of Social Gatherings with Friends and Family:    Attends Religious Services:    Active Member of Clubs or Organizations:    Attends Archivist Meetings:    Marital Status:      Family History:  The patient's family history includes Heart disease in his father.   Review of Systems:   Please see the history of present illness.     General:  No chills, fever, night sweats or weight changes.  Cardiovascular:  No dyspnea on exertion, edema, orthopnea, palpitations, paroxysmal nocturnal dyspnea. Positive for chest  discomfort (now resolved).  Dermatological: No rash, lesions/masses Respiratory: No cough, dyspnea Urologic: No hematuria, dysuria Abdominal:   No nausea, vomiting, diarrhea, bright red blood per rectum, melena, or hematemesis Neurologic:  No visual changes, wkns, changes in mental status. All other systems reviewed and are otherwise negative except as noted above.   Physical Exam:    VS:  BP 124/82    Pulse 72    Ht 5\' 8"  (1.727 m)    Wt 246 lb 9.6 oz (111.9 kg)    SpO2 95%    BMI 37.50 kg/m    General: Well  developed, well nourished,male appearing in no acute distress. Head: Normocephalic, atraumatic, sclera non-icteric.  Neck: No carotid bruits. JVD not elevated.  Lungs: Respirations regular and unlabored, without wheezes or rales.  Heart: Regular rate and rhythm. No S3 or S4.  No murmur, no rubs, or gallops appreciated. Abdomen: Soft, non-tender, non-distended. No obvious abdominal masses. Msk:  Strength and tone appear normal for age. No obvious joint deformities or effusions. Extremities: No clubbing or cyanosis. No lower extremity edema.  Distal pedal pulses are 2+ bilaterally. Neuro: Alert and oriented X 3. Moves all extremities spontaneously. No focal deficits noted. Psych:  Responds to questions appropriately with a normal affect. Skin: No rashes or lesions noted  Wt Readings from Last 3 Encounters:  05/26/20 246 lb 9.6 oz (111.9 kg)  05/18/20 246 lb 12.8 oz (111.9 kg)  04/14/20 245 lb (111.1 kg)      Studies/Labs Reviewed:   EKG:  EKG is not ordered today. EKG from 05/08/2020 is reviewed which shows NSR, HR 85 with no acute ST abnormalities when compared to prior tracings.   Recent Labs: 08/20/2019: Magnesium 2.2 05/21/2020: ALT 30; BUN 20; Creatinine, Ser 1.24; Hemoglobin 14.9; Platelets 225; Potassium 4.0; Sodium 141   Lipid Panel    Component Value Date/Time   CHOL 114 05/21/2020 0819   TRIG 106 05/21/2020 0819   HDL 38 (L) 05/21/2020 0819   CHOLHDL 3.0  05/21/2020 0819   CHOLHDL 3.2 07/18/2019 0839   VLDL 22 07/18/2019 0839   LDLCALC 56 05/21/2020 0819    Additional studies/ records that were reviewed today include:   Cardiac Catheterization: 05/2019  Unstable angina pectoris  Successful DES implantation in distal circumflex reducing a culprit 99% stenosis to 0% using a 2.5 x 18 Onyx postdilated to 2.75 mm.  TIMI grade III flow noted.  Normal left main.  Luminal irregularities in LAD  Luminal irregularities and dominant right coronary  RECOMMENDATIONS:   Aggressive secondary risk factor modification: Consider the diagnosis of sleep apnea; hemoglobin A1c should be less than 7; LDL cholesterol less than 70 and preferably 55 or less; blood pressure 130/80 mmHg or less; moderate aerobic activity; discontinue vaping.  Eligible for discharge in a.m. if no problems.   Assessment:    1. Coronary artery disease involving native coronary artery of native heart without angina pectoris   2. Essential hypertension   3. Hyperlipidemia LDL goal <70      Plan:   In order of problems listed above:  1. CAD - he is s/p DES to distal LCx in 05/2019 as outlined above. Work-up during recent ED evaluation was reassuring as HS troponin values were negative and EKG was without acute ischemic changes. He denies any symptoms as severe as what led to ED evaluation and reports symptoms at that time were different from when he required stent placement in the past. He describes an occasional tingling sensation which lasts for a few seconds and spontaneously resolves. He has been very active and denies any exertional chest pain. - Reviewed options with the patient today in regards to continued observation versus obtaining a stress test and given improvement in his symptoms, he wishes to continue with observation for now which is reasonable. I encouraged him to make [place] aware if symptoms change in frequency or severity. - Continue ASA, Brilinta,  beta-blocker and statin therapy. He plans to discontinue Brilinta after he finishes his current bottle as previously discussed with Dr. Domenic Polite as he is a year out from stent placement.  2.  HTN - BP is well-controlled at 124/82 during today's visit.  - Continue current regimen with Coreg 3.125mg  BID and Losartan-HCTZ 100-12.5mg  daily. Creatinine was elevated to 1.40 on 05/08/2020 but improved to 1.24 by repeat labs on 05/21/2020.  3. HLD - Followed by PCP.  FLP earlier this month showed total cholesterol 114, triglycerides 106, HDL 38 and LDL 56. At goal of LDL less than 70. LFT's were within normal limits. Continue Atorvastatin 80mg  daily.    Medication Adjustments/Labs and Tests Ordered: Current medicines are reviewed at length with the patient today.  Concerns regarding medicines are outlined above.  Medication changes, Labs and Tests ordered today are listed in the Patient Instructions below. Patient Instructions  Medication Instructions:  Your physician recommends that you continue on your current medications as directed. Please refer to the Current Medication list given to you today.  *If you need a refill on your cardiac medications before your next appointment, please call your pharmacy*   Lab Work: NONE   If you have labs (blood work) drawn today and your tests are completely normal, you will receive your results only by:  [facility] (if you have MyChart) OR  A paper copy in the mail If you have any lab test that is abnormal or we need to change your treatment, we will call you to review the results.   Testing/Procedures: NONE   Follow-Up: At [facility], you and your health needs are our priority.  As part of our continuing mission to provide you with exceptional heart care, we have created designated Provider Care Teams.  These Care Teams include your primary Cardiologist (physician) and Advanced Practice Providers (APPs -  Physician Assistants and Nurse  Practitioners) who all work together to provide you with the care you need, when you need it.  We recommend signing up for the patient portal called "MyChart".  Sign up information is provided on this After Visit Summary.  MyChart is used to connect with patients for Virtual Visits (Telemedicine).  Patients are able to view lab/test results, encounter notes, upcoming appointments, etc.  Non-urgent messages can be sent to your provider as well.   To learn more about what you can do with MyChart, go to [email or web address].    Your next appointment:   4 month(s)  The format for your next appointment:   In Person  Provider:   Rozann Lesches, MD   Other Instructions Thank you for choosing [facility]!    Signed, Erma Heritage, PA-C  05/26/2020 2:41 PM    [facility]. [address] [place], [place] [address] Phone: [phone] Fax: [phone]

## 2020-05-26 NOTE — Patient Instructions (Signed)
Medication Instructions:  Your physician recommends that you continue on your current medications as directed. Please refer to the Current Medication list given to you today.  *If you need a refill on your cardiac medications before your next appointment, please call your pharmacy*   Lab Work: NONE   If you have labs (blood work) drawn today and your tests are completely normal, you will receive your results only by: . MyChart Message (if you have MyChart) OR . A paper copy in the mail If you have any lab test that is abnormal or we need to change your treatment, we will call you to review the results.   Testing/Procedures: NONE    Follow-Up: At [facility], you and your health needs are our priority.  As part of our continuing mission to provide you with exceptional heart care, we have created designated Provider Care Teams.  These Care Teams include your primary Cardiologist (physician) and Advanced Practice Providers (APPs -  Physician Assistants and Nurse Practitioners) who all work together to provide you with the care you need, when you need it.  We recommend signing up for the patient portal called "MyChart".  Sign up information is provided on this After Visit Summary.  MyChart is used to connect with patients for Virtual Visits (Telemedicine).  Patients are able to view lab/test results, encounter notes, upcoming appointments, etc.  Non-urgent messages can be sent to your provider as well.   To learn more about what you can do with MyChart, go to [email or web address].    Your next appointment:   4 month(s)  The format for your next appointment:   In Person  Provider:   Samuel McDowell, MD   Other Instructions Thank you for choosing [facility]!    

## 2020-06-23 ENCOUNTER — Ambulatory Visit
Admission: EM | Admit: 2020-06-23 | Discharge: 2020-06-23 | Disposition: A | Discharge status: Home / Self Care | Payer: BC Managed Care – PPO | Attending: Emergency Medicine | Admitting: Emergency Medicine

## 2020-06-23 ENCOUNTER — Encounter: Payer: Self-pay | Admitting: Emergency Medicine

## 2020-06-23 ENCOUNTER — Other Ambulatory Visit: Payer: Self-pay

## 2020-06-23 DIAGNOSIS — R109 Unspecified abdominal pain: Secondary | ICD-10-CM | POA: Diagnosis not present

## 2020-06-23 DIAGNOSIS — R110 Nausea: Secondary | ICD-10-CM | POA: Diagnosis not present

## 2020-06-23 LAB — POCT URINALYSIS DIP (MANUAL ENTRY)
Bilirubin, UA: NEGATIVE
Glucose, UA: NEGATIVE mg/dL
Ketones, POC UA: NEGATIVE mg/dL
Leukocytes, UA: NEGATIVE
Nitrite, UA: NEGATIVE
Protein Ur, POC: NEGATIVE mg/dL
Spec Grav, UA: 1.02 (ref 1.010–1.025)
Urobilinogen, UA: 0.2 E.U./dL
pH, UA: 5.5 (ref 5.0–8.0)

## 2020-06-23 MED ORDER — TRAMADOL HCL 50 MG PO TABS: 50.0000 mg | ORAL_TABLET | Freq: Two times a day (BID) | ORAL | 0 refills | Status: DC

## 2020-06-23 MED ORDER — ONDANSETRON HCL 4 MG PO TABS: 4.0000 mg | ORAL_TABLET | Freq: Three times a day (TID) | ORAL | 0 refills | Status: DC | PRN

## 2020-06-23 NOTE — Discharge Instructions (Signed)
Urine culture sent.  We will call you with the results.   Push fluids and get plenty of rest.   Zofran prescribed take as directed Tramadol (5 tablet) was prescribed for pain Follow up with PCP if symptoms persists Return here or go to ER if you have any new or worsening symptoms such as fever, worsening abdominal pain, nausea/vomiting, flank pain, etc..

## 2020-06-23 NOTE — ED Triage Notes (Signed)
RT flank pain since Thursday last week.  Hx of kidney stones.  Pt states he is out of pain medication. States he has been using flowmax since Thursday

## 2020-06-23 NOTE — ED Provider Notes (Signed)
[facility]   Chief Complaint  Patient presents with  . Flank Pain     SUBJECTIVE:  Jeremy Riley is a 59 y.o. male who with history of kidney stone presented to the urgent care for complaint of right flank pain and nausea for the past 4 days.  Patient denies a precipitating event, recent sexual encounter, excessive caffeine intake.  Localizes the pain to the right lower  flank.  Pain is intermittent and describes it as sharp.  Has tried flomax without relief.  Symptoms are made worse with urination.  Admits to similar symptoms in the past.  Denies fever, chills, nausea, vomiting, abdominal pain, hematuria.    LMP: No LMP for male patient.  ROS: As in HPI.  All other pertinent ROS negative.      Past Medical History:  Diagnosis Date  . Coronary artery disease    a. LHC 05/22/2019: 99% dCx s/p DES, luminal irregularities in LAD and dominant RCA  . History of pneumonia as a child   . Hyperlipidemia   . Hypertension   . Lumbar disc disease    Fall from ladder  . Lumbar pain 05/18/2020   Patient with known history of sciatica.  On multiple medicines for his heart cannot take anti-inflammatories, intermittent use of tramadol prescribed  . Pre-diabetes    Hgb A1c 6.3 in 05/2019.  Marland Kitchen Renal disorder    kidney stones   Past Surgical History:  Procedure Laterality Date  . CORONARY STENT INTERVENTION N/A 05/22/2019   Procedure: CORONARY STENT INTERVENTION;  Surgeon: Belva Crome, MD;  Location: [place] [facility];  Service: Cardiovascular;  Laterality: N/A;  . LEFT HEART CATH AND CORONARY ANGIOGRAPHY N/A 05/22/2019   Procedure: LEFT HEART CATH AND CORONARY ANGIOGRAPHY;  Surgeon: Belva Crome, MD;  Location: [place] [facility];  Service: Cardiovascular;  Laterality: N/A;   No Known Allergies No current facility-administered medications on file prior to encounter.   Current Outpatient Medications on File Prior to Encounter  Medication Sig Dispense Refill  . acetaminophen  (TYLENOL) 500 MG tablet Take 500 mg by mouth every 6 (six) hours as needed for mild pain or fever.     Marland Kitchen aspirin 81 MG chewable tablet Chew 1 tablet (81 mg total) by mouth daily.    Marland Kitchen atorvastatin (LIPITOR) 80 MG tablet Take 1 tablet (80 mg total) by mouth daily at 6 PM. 90 tablet 3  . carvedilol (COREG) 3.125 MG tablet Take 1 tablet (3.125 mg total) by mouth 2 (two) times daily with a meal. 180 tablet 3  . losartan-hydrochlorothiazide (HYZAAR) 100-12.5 MG tablet Take 1 tablet by mouth daily. 90 tablet 3  . nitroGLYCERIN (NITROSTAT) 0.4 MG SL tablet Place 1 tablet (0.4 mg total) under the tongue every 5 (five) minutes x 3 doses as needed for chest pain. 25 tablet 3  . ticagrelor (BRILINTA) 90 MG TABS tablet Take 1 tablet (90 mg total) by mouth 2 (two) times daily. (MAY STOP THE END OF June) 60 tablet 2   Social History   Socioeconomic History  . Marital status: Married    Spouse name: Not on file  . Number of children: Not on file  . Years of education: Not on file  . Highest education level: Not on file  Occupational History  . Not on file  Tobacco Use  . Smoking status: Former Smoker    Types: Cigarettes    Quit date: 01/08/2009    Years since quitting: 11.4  .  Smokeless tobacco: Current User    Types: Chew  Vaping Use  . Vaping Use: Every day  . Substances: Nicotine, Flavoring  Substance and Sexual Activity  . Alcohol use: Yes    Comment: occ  . Drug use: Not Currently  . Sexual activity: Not on file  Other Topics Concern  . Not on file  Social History Narrative  . Not on file   Social Determinants of Health   Financial Resource Strain:   . Difficulty of Paying Living Expenses:   Food Insecurity:   . Worried About Charity fundraiser in the Last Year:   . Arboriculturist in the Last Year:   Transportation Needs:   . Film/video editor (Medical):   Marland Kitchen Lack of Transportation (Non-Medical):   Physical Activity:   . Days of Exercise per Week:   . Minutes of Exercise  per Session:   Stress:   . Feeling of Stress :   Social Connections:   . Frequency of Communication with Friends and Family:   . Frequency of Social Gatherings with Friends and Family:   . Attends Religious Services:   . Active Member of Clubs or Organizations:   . Attends Archivist Meetings:   Marland Kitchen Marital Status:   Intimate Partner Violence:   . Fear of Current or Ex-Partner:   . Emotionally Abused:   Marland Kitchen Physically Abused:   . Sexually Abused:    Family History  Problem Relation Age of Onset  . Heart disease Father        CABG age 29's    OBJECTIVE:  Vitals:   06/23/20 0926  BP: 135/90  Pulse: 84  Resp: 19  Temp: 98.3 F (36.8 C)  TempSrc: Oral  SpO2: 94%   General appearance: AOx3 in no acute distress HEENT: NCAT.  Oropharynx clear.  Lungs: clear to auscultation bilaterally without adventitious breath sounds Heart: regular rate and rhythm.  Radial pulses 2+ symmetrical bilaterally Abdomen: soft; non-distended; no tenderness; bowel sounds present; no guarding or rebound tenderness Back: no CVA tenderness Extremities: no edema; symmetrical with no gross deformities Skin: warm and dry Neurologic: Ambulates from chair to exam table without difficulty Psychological: alert and cooperative; normal mood and affect  Labs Reviewed  POCT URINALYSIS DIP (MANUAL ENTRY) - Abnormal; Notable for the following components:      Result Value   Blood, UA moderate (*)    All other components within normal limits  URINE CULTURE    ASSESSMENT & PLAN:  1. Flank pain   2. Nausea without vomiting     Meds ordered this encounter  Medications  . traMADol (ULTRAM) 50 MG tablet    Sig: Take 1 tablet (50 mg total) by mouth in the morning and at bedtime. For severe pain    Dispense:  5 tablet    Refill:  0  . ondansetron (ZOFRAN) 4 MG tablet    Sig: Take 1 tablet (4 mg total) by mouth every 8 (eight) hours as needed for nausea or vomiting.    Dispense:  20 tablet     Refill:  0   Discharge instructions Urine culture sent.  We will call you with the results.   Push fluids and get plenty of rest.   Zofran prescribed take as directed Tramadol (5 tablet) was prescribed for pain Follow up with PCP if symptoms persists Return here or go to ER if you have any new or worsening symptoms such as fever, worsening abdominal pain,  nausea/vomiting, flank pain, etc...  Outlined signs and symptoms indicating need for more acute intervention. Patient verbalized understanding. After Visit Summary given.  Note: This document was prepared using Dragon voice recognition software and may include unintentional dictation errors.    Emerson Monte, FNP 06/23/20 1000

## 2020-06-25 LAB — URINE CULTURE: Culture: <10000 — AB

## 2020-06-26 ENCOUNTER — Telehealth: Payer: Self-pay | Admitting: Family Medicine

## 2020-06-26 NOTE — Telephone Encounter (Signed)
Pt currently has a kidney stone and has been dealing with it since last Thursday. Pt's wife is calling requesting referral to urology.

## 2020-06-26 NOTE — Telephone Encounter (Signed)
Went to ED on 7/13

## 2020-06-26 NOTE — Telephone Encounter (Signed)
Discussed with pt. Pt states he has some flomax at home and does not need at this time and did want appt Monday. Appt given and pt states he will discuss referral with dr scott on Monday and to hold off on putting that in right now.

## 2020-06-26 NOTE — Telephone Encounter (Signed)
May have referral to [facility] Flomax can help with increasing the likelihood of passing the stone If interested Flomax 0.4 mg 1 nightly, #14, may take 1 nightly until stone passes in some people it can cause blood pressure to drop so careful with standing up if having excessive dizziness or low blood pressure stop medication  It can take a little while to get in with urology if the patient would like to be seen on Monday by myself in the meantime then go ahead and give him a slot with me Unfortunately I am out this afternoon If the patient gets worse today tomorrow or Sunday return to the ER would be advisable

## 2020-06-29 ENCOUNTER — Encounter: Payer: Self-pay | Admitting: Family Medicine

## 2020-06-29 ENCOUNTER — Ambulatory Visit (HOSPITAL_COMMUNITY)
Admission: RE | Admit: 2020-06-29 | Discharge: 2020-06-29 | Disposition: A | Discharge status: Home / Self Care | Payer: BC Managed Care – PPO | Source: Ambulatory Visit | Attending: Family Medicine | Admitting: Family Medicine

## 2020-06-29 ENCOUNTER — Other Ambulatory Visit: Payer: Self-pay

## 2020-06-29 ENCOUNTER — Ambulatory Visit: Payer: BC Managed Care – PPO | Admitting: Family Medicine

## 2020-06-29 ENCOUNTER — Telehealth: Payer: Self-pay | Admitting: Family Medicine

## 2020-06-29 VITALS — BP 136/86 | Temp 97.9°F | Wt 241.0 lb

## 2020-06-29 DIAGNOSIS — N201 Calculus of ureter: Secondary | ICD-10-CM | POA: Diagnosis not present

## 2020-06-29 DIAGNOSIS — N23 Unspecified renal colic: Secondary | ICD-10-CM

## 2020-06-29 DIAGNOSIS — R109 Unspecified abdominal pain: Secondary | ICD-10-CM | POA: Diagnosis not present

## 2020-06-29 DIAGNOSIS — I700 Atherosclerosis of aorta: Secondary | ICD-10-CM | POA: Diagnosis not present

## 2020-06-29 DIAGNOSIS — N200 Calculus of kidney: Secondary | ICD-10-CM | POA: Diagnosis not present

## 2020-06-29 DIAGNOSIS — K5790 Diverticulosis of intestine, part unspecified, without perforation or abscess without bleeding: Secondary | ICD-10-CM | POA: Diagnosis not present

## 2020-06-29 LAB — POCT URINALYSIS DIPSTICK
Protein, UA: POSITIVE — AB
Spec Grav, UA: 1.015 (ref 1.010–1.025)
pH, UA: 5 (ref 5.0–8.0)

## 2020-06-29 MED ORDER — HYDROCODONE-ACETAMINOPHEN 10-325 MG PO TABS: 1.0000 | ORAL_TABLET | ORAL | 0 refills | Status: AC | PRN

## 2020-06-29 MED ORDER — HYDROCODONE-ACETAMINOPHEN 10-325 MG PO TABS: 1.0000 | ORAL_TABLET | ORAL | 0 refills | Status: DC | PRN

## 2020-06-29 NOTE — Telephone Encounter (Signed)
Please see result note with his CAT scan.  Patient aware of the test.  Patient needs urgent visit with me urology this week because of 6 mm kidney stone on the right side-please call urology to help set this up ASAP

## 2020-06-29 NOTE — Progress Notes (Signed)
   Subjective:    Patient ID: Jeremy Riley, male    DOB: February 28, 1961, 59 y.o.   MRN: [id number]  HPI Patient comes in today with complaints of kidney stone pain x 1 week.  Flank pain that comes and goes, some nausea and vomiting.  He went to urgent care and was given zofran and tramadol but is unable to take tramadol due to stomach upset.   Patient with severe right flank pain.  Denies high fever chills sweats denies hematuria has history of kidney stones.  Because of the pain patient is having a hard time resting.  Did not tolerate trazodone does not want any oxycodone he is willing to try hydrocodone for short-term use Review of Systems  Constitutional: Negative for activity change.  HENT: Negative for congestion and rhinorrhea.   Respiratory: Negative for cough and shortness of breath.   Cardiovascular: Negative for chest pain.  Gastrointestinal: Negative for abdominal pain, diarrhea, nausea and vomiting.  Genitourinary: Positive for frequency and hematuria. Negative for dysuria.  Neurological: Negative for weakness and headaches.  Psychiatric/Behavioral: Negative for behavioral problems and confusion.       Objective:   Physical Exam Vitals reviewed.  Constitutional:      General: He is not in acute distress. HENT:     Head: Normocephalic and atraumatic.  Eyes:     General:        Right eye: No discharge.        Left eye: No discharge.  Neck:     Trachea: No tracheal deviation.  Cardiovascular:     Rate and Rhythm: Normal rate and regular rhythm.     Heart sounds: Normal heart sounds. No murmur heard.   Pulmonary:     Effort: Pulmonary effort is normal. No respiratory distress.     Breath sounds: Normal breath sounds.  Lymphadenopathy:     Cervical: No cervical adenopathy.  Skin:    General: Skin is warm and dry.  Neurological:     Mental Status: He is alert.     Coordination: Coordination normal.  Psychiatric:        Behavior: Behavior normal.    Pain on the  right side of the abdomen in the right flank region Urine does show a few RBCs       Assessment & Plan:  Renal colic Kidney stones Antibiotics prescribed Follow-up if progressive troubles or if worse Warning signs discussed in detail Hydrocodone for severe pain caution drowsiness home use only Continue Flomax   Addendum stat CAT scan showed kidney stones we will go ahead and work on setting him up with urology for ASAP visit

## 2020-06-30 ENCOUNTER — Other Ambulatory Visit: Payer: Self-pay | Admitting: *Deleted

## 2020-06-30 ENCOUNTER — Other Ambulatory Visit (HOSPITAL_COMMUNITY): Payer: BC Managed Care – PPO

## 2020-06-30 ENCOUNTER — Encounter: Payer: Self-pay | Admitting: Family Medicine

## 2020-06-30 DIAGNOSIS — N200 Calculus of kidney: Secondary | ICD-10-CM

## 2020-06-30 DIAGNOSIS — R109 Unspecified abdominal pain: Secondary | ICD-10-CM

## 2020-06-30 NOTE — Telephone Encounter (Signed)
Patient notified and referral to urology in epic.

## 2020-07-14 ENCOUNTER — Other Ambulatory Visit: Payer: Self-pay

## 2020-07-14 ENCOUNTER — Encounter (HOSPITAL_COMMUNITY): Payer: Self-pay | Admitting: Emergency Medicine

## 2020-07-14 ENCOUNTER — Emergency Department (HOSPITAL_COMMUNITY)
Admission: EM | Admit: 2020-07-14 | Discharge: 2020-07-14 | Disposition: A | Discharge status: Home / Self Care | Payer: BC Managed Care – PPO | Attending: Emergency Medicine | Admitting: Emergency Medicine

## 2020-07-14 DIAGNOSIS — R109 Unspecified abdominal pain: Secondary | ICD-10-CM | POA: Insufficient documentation

## 2020-07-14 DIAGNOSIS — Z5321 Procedure and treatment not carried out due to patient leaving prior to being seen by health care provider: Secondary | ICD-10-CM | POA: Diagnosis not present

## 2020-07-14 LAB — BASIC METABOLIC PANEL
Anion gap: 9 (ref 5–15)
BUN: 22 mg/dL — ABNORMAL HIGH (ref 6–20)
CO2: 29 mmol/L (ref 22–32)
Calcium: 9.5 mg/dL (ref 8.9–10.3)
Chloride: 102 mmol/L (ref 98–111)
Creatinine, Ser: 1.61 mg/dL — ABNORMAL HIGH (ref 0.61–1.24)
GFR calc Af Amer: 53 mL/min — ABNORMAL LOW (ref >60)
GFR calc non Af Amer: 46 mL/min — ABNORMAL LOW (ref >60)
Glucose, Bld: 119 mg/dL — ABNORMAL HIGH (ref 70–99)
Potassium: 3.8 mmol/L (ref 3.5–5.1)
Sodium: 140 mmol/L (ref 135–145)

## 2020-07-14 LAB — CBC
HCT: 42.5 % (ref 39.0–52.0)
Hemoglobin: 14.4 g/dL (ref 13.0–17.0)
MCH: 30.3 pg (ref 26.0–34.0)
MCHC: 33.9 g/dL (ref 30.0–36.0)
MCV: 89.3 fL (ref 80.0–100.0)
Platelets: 223 10*3/uL (ref 150–400)
RBC: 4.76 MIL/uL (ref 4.22–5.81)
RDW: 12.5 % (ref 11.5–15.5)
WBC: 6.7 10*3/uL (ref 4.0–10.5)
nRBC: 0 % (ref 0.0–0.2)

## 2020-07-14 NOTE — ED Triage Notes (Signed)
Pt arrives to ED with c/o of flank pain and known kidney stones , has urologist apt this coming morning but pain worse. Pt has been able to pass urine but has not been abel; to pass able stones that he aware of.

## 2020-07-14 NOTE — ED Notes (Signed)
Pt notified staff that he is leaving. 

## 2020-07-20 ENCOUNTER — Other Ambulatory Visit: Payer: Self-pay

## 2020-07-20 ENCOUNTER — Encounter (HOSPITAL_COMMUNITY)
Admission: RE | Admit: 2020-07-20 | Discharge: 2020-07-20 | Disposition: A | Discharge status: Home / Self Care | Payer: BC Managed Care – PPO | Source: Ambulatory Visit | Attending: Urology | Admitting: Urology

## 2020-07-20 ENCOUNTER — Ambulatory Visit (INDEPENDENT_AMBULATORY_CARE_PROVIDER_SITE_OTHER): Payer: BC Managed Care – PPO | Admitting: Urology

## 2020-07-20 ENCOUNTER — Encounter: Payer: Self-pay | Admitting: Urology

## 2020-07-20 ENCOUNTER — Ambulatory Visit (HOSPITAL_COMMUNITY)
Admission: RE | Admit: 2020-07-20 | Discharge: 2020-07-20 | Disposition: A | Discharge status: Home / Self Care | Payer: BC Managed Care – PPO | Source: Ambulatory Visit | Attending: Urology | Admitting: Urology

## 2020-07-20 ENCOUNTER — Other Ambulatory Visit (HOSPITAL_COMMUNITY)
Admission: RE | Admit: 2020-07-20 | Discharge: 2020-07-20 | Disposition: A | Discharge status: Home / Self Care | Payer: BC Managed Care – PPO | Source: Ambulatory Visit | Attending: Urology | Admitting: Urology

## 2020-07-20 VITALS — BP 148/89 | HR 83 | Temp 97.9°F | Ht 68.0 in | Wt 241.0 lb

## 2020-07-20 DIAGNOSIS — Z87442 Personal history of urinary calculi: Secondary | ICD-10-CM | POA: Diagnosis not present

## 2020-07-20 DIAGNOSIS — N200 Calculus of kidney: Secondary | ICD-10-CM | POA: Insufficient documentation

## 2020-07-20 DIAGNOSIS — Z20822 Contact with and (suspected) exposure to covid-19: Secondary | ICD-10-CM | POA: Diagnosis not present

## 2020-07-20 LAB — URINALYSIS, ROUTINE W REFLEX MICROSCOPIC
Bilirubin, UA: NEGATIVE
Glucose, UA: NEGATIVE
Ketones, UA: NEGATIVE
Leukocytes,UA: NEGATIVE
Nitrite, UA: NEGATIVE
Protein,UA: NEGATIVE
Specific Gravity, UA: 1.02 (ref 1.005–1.030)
Urobilinogen, Ur: 0.2 mg/dL (ref 0.2–1.0)
pH, UA: 5.5 (ref 5.0–7.5)

## 2020-07-20 LAB — MICROSCOPIC EXAMINATION
Bacteria, UA: NONE SEEN
Epithelial Cells (non renal): NONE SEEN /hpf (ref 0–10)
Renal Epithel, UA: NONE SEEN /hpf
WBC, UA: NONE SEEN /hpf (ref 0–5)

## 2020-07-20 LAB — SARS CORONAVIRUS 2 (TAT 6-24 HRS): SARS Coronavirus 2: NEGATIVE

## 2020-07-20 NOTE — Pre-Procedure Instructions (Signed)
Interoffice message sent to Dr Alyson Ingles for preop orders.

## 2020-07-20 NOTE — Patient Instructions (Signed)
    Jeremy Riley  07/20/2020     @PREFPERIOPPHARMACY @   Your procedure is scheduled on  07/21/2020.  Report to [facility] at  0900  A.M.  Call this number if you have problems the morning of surgery:  [phone]   Remember:  Do not eat or drink after midnight.                       Take these medicines the morning of surgery with A SIP OF WATER  Coreg, hydrocodone(if needed), zofran(if needed).    Do not wear jewelry, make-up or nail polish.  Do not wear lotions, powders, or perfumes. Please wear deodorant and brush your teeth.  Do not shave 48 hours prior to surgery.  Men may shave face and neck.  Do not bring valuables to the hospital.  [facility] is not responsible for any belongings or valuables.  Contacts, dentures or bridgework may not be worn into surgery.  Leave your suitcase in the car.  After surgery it may be brought to your room.  For patients admitted to the hospital, discharge time will be determined by your treatment team.  Patients discharged the day of surgery will not be allowed to drive home.   Name and phone number of your driver:   family Special instructions:  DO NOT smoke the morning of your procedure.  Please read over the following fact sheets that you were given. Anesthesia Post-op Instructions and Care and Recovery After Surgery

## 2020-07-20 NOTE — H&P (View-Only) (Signed)
07/20/2020 9:50 AM   Jeremy Riley [birth year] [id number]  Referring provider: Kathyrn Drown, MD [address] [place],  [place] [id number]  Nephrolithiasis  HPI: Jeremy Riley is a 59yo here for evaluation of nephrolithiasis. His first stone event was around age 21. He has had 4 stone events in the past. He is on ASA 81mg . He had a cardiac stent placed over 1 year ago and stopped the brilinta 1 month ago. He currently has right flank that is mild. No LUTS. No hematuria.    PMH: Past Medical History:  Diagnosis Date  . Coronary artery disease    a. LHC 05/22/2019: 99% dCx s/p DES, luminal irregularities in LAD and dominant RCA  . History of pneumonia as a child   . Hyperlipidemia   . Hypertension   . Lumbar disc disease    Fall from ladder  . Lumbar pain 05/18/2020   Patient with known history of sciatica.  On multiple medicines for his heart cannot take anti-inflammatories, intermittent use of tramadol prescribed  . Pre-diabetes    Hgb A1c 6.3 in 05/2019.  Marland Kitchen Renal disorder    kidney stones    Surgical History: Past Surgical History:  Procedure Laterality Date  . CORONARY STENT INTERVENTION N/A 05/22/2019   Procedure: CORONARY STENT INTERVENTION;  Surgeon: Belva Crome, MD;  Location: [place] [facility];  Service: Cardiovascular;  Laterality: N/A;  . LEFT HEART CATH AND CORONARY ANGIOGRAPHY N/A 05/22/2019   Procedure: LEFT HEART CATH AND CORONARY ANGIOGRAPHY;  Surgeon: Belva Crome, MD;  Location: [place] [facility];  Service: Cardiovascular;  Laterality: N/A;    Home Medications:  Allergies as of 07/20/2020   No Known Allergies     Medication List       Accurate as of July 20, 2020  9:50 AM. If you have any questions, ask your nurse or doctor.        acetaminophen 500 MG tablet Commonly known as: TYLENOL Take 500 mg by mouth every 6 (six) hours as needed for mild pain or fever.   aspirin 81 MG chewable tablet Chew 1 tablet (81 mg total) by mouth  daily.   atorvastatin 80 MG tablet Commonly known as: LIPITOR Take 1 tablet (80 mg total) by mouth daily at 6 PM.   carvedilol 3.125 MG tablet Commonly known as: COREG Take 1 tablet (3.125 mg total) by mouth 2 (two) times daily with a meal.   HYDROcodone-acetaminophen 5-325 MG tablet Commonly known as: NORCO/VICODIN Take 1 tablet by mouth every 6 (six) hours as needed for moderate pain.   losartan-hydrochlorothiazide 100-12.5 MG tablet Commonly known as: HYZAAR Take 1 tablet by mouth daily.   nitroGLYCERIN 0.4 MG SL tablet Commonly known as: NITROSTAT Place 1 tablet (0.4 mg total) under the tongue every 5 (five) minutes x 3 doses as needed for chest pain.   ondansetron 4 MG tablet Commonly known as: Zofran Take 1 tablet (4 mg total) by mouth every 8 (eight) hours as needed for nausea or vomiting.   tamsulosin 0.4 MG Caps capsule Commonly known as: FLOMAX Take 0.4 mg by mouth.   ticagrelor 90 MG Tabs tablet Commonly known as: BRILINTA Take 1 tablet (90 mg total) by mouth 2 (two) times daily. (MAY STOP THE END OF June)   traMADol 50 MG tablet Commonly known as: ULTRAM Take 1 tablet (50 mg total) by mouth in the morning and at bedtime. For severe pain       Allergies:  No Known Allergies  Family History: Family History  Problem Relation Age of Onset  . Heart disease Father        CABG age 45's    Social History:  reports that he quit smoking about 11 years ago. His smoking use included cigarettes. He quit after 30.00 years of use. His smokeless tobacco use includes chew. He reports current alcohol use. He reports previous drug use.  ROS: All other review of systems were reviewed and are negative except what is noted above in HPI  Physical Exam: BP (!) 148/89   Pulse 83   Temp 97.9 F (36.6 C)   Ht 5\' 8"  (1.727 m)   Wt 241 lb (109.3 kg)   BMI 36.64 kg/m   Constitutional:  Alert and oriented, No acute distress. HEENT: [place] AT, moist mucus membranes.  Trachea  midline, no masses. Cardiovascular: No clubbing, cyanosis, or edema. Respiratory: Normal respiratory effort, no increased work of breathing. GI: Abdomen is soft, nontender, nondistended, no abdominal masses GU: No CVA tenderness.  Lymph: No cervical or inguinal lymphadenopathy. Skin: No rashes, bruises or suspicious lesions. Neurologic: Grossly intact, no focal deficits, moving all 4 extremities. Psychiatric: Normal mood and affect.  Laboratory Data: Lab Results  Component Value Date   WBC 6.7 07/14/2020   HGB 14.4 07/14/2020   HCT 42.5 07/14/2020   MCV 89.3 07/14/2020   PLT 223 07/14/2020    Lab Results  Component Value Date   CREATININE 1.61 (H) 07/14/2020    No results found for: PSA  No results found for: TESTOSTERONE  Lab Results  Component Value Date   HGBA1C 5.9 (H) 07/18/2019    Urinalysis    Component Value Date/Time   COLORURINE YELLOW 05/21/2008 0638   APPEARANCEUR Clear 07/20/2020 0930   LABSPEC >1.030 (H) 05/21/2008 0638   PHURINE 5.0 05/21/2008 0638   GLUCOSEU Negative 07/20/2020 0930   HGBUR LARGE (A) 05/21/2008 0638   BILIRUBINUR Negative 07/20/2020 0930   KETONESUR negative 06/23/2020 0937   KETONESUR NEGATIVE 05/21/2008 0638   PROTEINUR Negative 07/20/2020 0930   PROTEINUR NEGATIVE 05/21/2008 0638   UROBILINOGEN 0.2 06/23/2020 0937   UROBILINOGEN 0.2 05/21/2008 0638   NITRITE Negative 07/20/2020 0930   NITRITE NEGATIVE 05/21/2008 0638   LEUKOCYTESUR Negative 07/20/2020 0930    Lab Results  Component Value Date   LABMICR See below: 07/20/2020   WBCUA None seen 07/20/2020   LABEPIT None seen 07/20/2020   MUCUS Present 07/20/2020   BACTERIA None seen 07/20/2020    Pertinent Imaging: KUB today: Images reviewed and discussed with patient  No results found for this or any previous visit.  No results found for this or any previous visit.  No results found for this or any previous visit.  No results found for this or any previous  visit.  No results found for this or any previous visit.  No results found for this or any previous visit.  No results found for this or any previous visit.  Results for orders placed in visit on 06/29/20  CT RENAL STONE STUDY  Narrative CLINICAL DATA:  Right-sided flank pain with hematuria  EXAM: CT ABDOMEN AND PELVIS WITHOUT CONTRAST  TECHNIQUE: Multidetector CT imaging of the abdomen and pelvis was performed following the standard protocol without IV contrast.  COMPARISON:  CT 05/21/2008  FINDINGS: Lower chest: Lung bases demonstrate no acute consolidation or effusion. Coronary vascular calcifications.  Hepatobiliary: No focal liver abnormality is seen. No gallstones, gallbladder wall thickening, or biliary dilatation.  Pancreas: Unremarkable. No pancreatic ductal dilatation or surrounding inflammatory changes.  Spleen: Normal in size without focal abnormality.  Adrenals/Urinary Tract: Adrenal glands are normal. Multiple intrarenal stones bilaterally. Largest stone on the left is seen in the mid to lower pole and measures 11 mm. Largest stone on the right is seen within the midpole and measures up to 4 mm in size. No significant hydronephrosis or hydroureter. 6 mm stone in the proximal to mid right ureter at the inferior aspect of L4. The urinary bladder is slightly thick walled.  Stomach/Bowel: Stomach is within normal limits. Appendix appears normal. No evidence of bowel wall thickening, distention, or inflammatory changes. Diverticular disease of the left colon without acute inflammatory change.  Vascular/Lymphatic: Mild aortic atherosclerosis without aneurysm. No suspicious nodes.  Reproductive: Slightly enlarged prostate with coarse calcifications.  Other: Negative for free air or free fluid. Small fat containing umbilical hernia. Small fat containing inguinal hernias.  Musculoskeletal: No acute or significant osseous findings.  IMPRESSION: 1. 6 mm  stone within the proximal to mid right ureter at approximate inferior L4 level, though without significant hydronephrosis or proximal hydroureter. 2. Numerous intrarenal stones bilaterally 3. Diverticular disease of the left colon without acute inflammatory change.  These results will be called to the ordering clinician or representative by the Radiology Department at the imaging location.   Electronically Signed By: Donavan Foil M.D. On: 06/29/2020 17:56   Assessment & Plan:    1. Kidney stones -We discussed the management of kidney stones. These options include observation, ureteroscopy, shockwave lithotripsy (ESWL) and percutaneous nephrolithotomy (PCNL). We discussed which options are relevant to the patient's stone(s). We discussed the natural history of kidney stones as well as the complications of untreated stones and the impact on quality of life without treatment as well as with each of the above listed treatments. We also discussed the efficacy of each treatment in its ability to clear the stone burden. With any of these management options I discussed the signs and symptoms of infection and the need for emergent treatment should these be experienced. For each option we discussed the ability of each procedure to clear the patient of their stone burden.   For observation I described the risks which include but are not limited to silent renal damage, life-threatening infection, need for emergent surgery, failure to pass stone and pain.   For ureteroscopy I described the risks which include bleeding, infection, damage to contiguous structures, positioning injury, ureteral stricture, ureteral avulsion, ureteral injury, need for prolonged ureteral stent, inability to perform ureteroscopy, need for an interval procedure, inability to clear stone burden, stent discomfort/pain, heart attack, stroke, pulmonary embolus and the inherent risks with general anesthesia.   For shockwave  lithotripsy I described the risks which include arrhythmia, kidney contusion, kidney hemorrhage, need for transfusion, pain, inability to adequately break up stone, inability to pass stone fragments, Steinstrasse, infection associated with obstructing stones, need for alternate surgical procedure, need for repeat shockwave lithotripsy, MI, CVA, PE and the inherent risks with anesthesia/conscious sedation.   For PCNL I described the risks including positioning injury, pneumothorax, hydrothorax, need for chest tube, inability to clear stone burden, renal laceration, arterial venous fistula or malformation, need for embolization of kidney, loss of kidney or renal function, need for repeat procedure, need for prolonged nephrostomy tube, ureteral avulsion, MI, CVA, PE and the inherent risks of general anesthesia.   - The patient would like to proceed with Right ESWL - Urinalysis, Routine w reflex microscopic  No follow-ups on file.  Nicolette Bang, MD  [facility] [place]

## 2020-07-20 NOTE — Progress Notes (Signed)
07/20/2020 9:50 AM   Jeremy Riley 21-Sep-1961 [id number]  Referring provider: Kathyrn Drown, MD [place],  [place] [address]  Nephrolithiasis  HPI: [name] is a 59yo here for evaluation of nephrolithiasis. His first stone event was around age 51. He has had 4 stone events in the past. He is on ASA 81mg . He had a cardiac stent placed over 1 year ago and stopped the brilinta 1 month ago. He currently has right flank that is mild. No LUTS. No hematuria.    PMH: Past Medical History:  Diagnosis Date   Coronary artery disease    a. LHC 05/22/2019: 99% dCx s/p DES, luminal irregularities in LAD and dominant RCA   History of pneumonia as a child    Hyperlipidemia    Hypertension    Lumbar disc disease    Fall from ladder   Lumbar pain 05/18/2020   Patient with known history of sciatica.  On multiple medicines for his heart cannot take anti-inflammatories, intermittent use of tramadol prescribed   Pre-diabetes    Hgb A1c 6.3 in 05/2019.   Renal disorder    kidney stones    Surgical History: Past Surgical History:  Procedure Laterality Date   CORONARY STENT INTERVENTION N/A 05/22/2019   Procedure: CORONARY STENT INTERVENTION;  Surgeon: Belva Crome, MD;  Location: [facility];  Service: Cardiovascular;  Laterality: N/A;   LEFT HEART CATH AND CORONARY ANGIOGRAPHY N/A 05/22/2019   Procedure: LEFT HEART CATH AND CORONARY ANGIOGRAPHY;  Surgeon: Belva Crome, MD;  Location: [facility];  Service: Cardiovascular;  Laterality: N/A;    Home Medications:  Allergies as of 07/20/2020   No Known Allergies     Medication List       Accurate as of July 20, 2020  9:50 AM. If you have any questions, ask your nurse or doctor.        acetaminophen 500 MG tablet Commonly known as: TYLENOL Take 500 mg by mouth every 6 (six) hours as needed for mild pain or fever.   aspirin 81 MG chewable tablet Chew 1 tablet (81 mg total) by mouth  daily.   atorvastatin 80 MG tablet Commonly known as: LIPITOR Take 1 tablet (80 mg total) by mouth daily at 6 PM.   carvedilol 3.125 MG tablet Commonly known as: COREG Take 1 tablet (3.125 mg total) by mouth 2 (two) times daily with a meal.   HYDROcodone-acetaminophen 5-325 MG tablet Commonly known as: NORCO/VICODIN Take 1 tablet by mouth every 6 (six) hours as needed for moderate pain.   losartan-hydrochlorothiazide 100-12.5 MG tablet Commonly known as: HYZAAR Take 1 tablet by mouth daily.   nitroGLYCERIN 0.4 MG SL tablet Commonly known as: NITROSTAT Place 1 tablet (0.4 mg total) under the tongue every 5 (five) minutes x 3 doses as needed for chest pain.   ondansetron 4 MG tablet Commonly known as: Zofran Take 1 tablet (4 mg total) by mouth every 8 (eight) hours as needed for nausea or vomiting.   tamsulosin 0.4 MG Caps capsule Commonly known as: FLOMAX Take 0.4 mg by mouth.   ticagrelor 90 MG Tabs tablet Commonly known as: BRILINTA Take 1 tablet (90 mg total) by mouth 2 (two) times daily. (MAY STOP THE END OF June)   traMADol 50 MG tablet Commonly known as: ULTRAM Take 1 tablet (50 mg total) by mouth in the morning and at bedtime. For severe pain       Allergies:  No Known Allergies  Family History: Family History  Problem Relation Age of Onset   Heart disease Father        CABG age 35's    Social History:  reports that he quit smoking about 11 years ago. His smoking use included cigarettes. He quit after 30.00 years of use. His smokeless tobacco use includes chew. He reports current alcohol use. He reports previous drug use.  ROS: All other review of systems were reviewed and are negative except what is noted above in HPI  Physical Exam: BP (!) 148/89    Pulse 83    Temp 97.9 F (36.6 C)    Ht 5\' 8"  (1.727 m)    Wt 241 lb (109.3 kg)    BMI 36.64 kg/m   Constitutional:  Alert and oriented, No acute distress. HEENT: [place] AT, moist mucus membranes.  Trachea  midline, no masses. Cardiovascular: No clubbing, cyanosis, or edema. Respiratory: Normal respiratory effort, no increased work of breathing. GI: Abdomen is soft, nontender, nondistended, no abdominal masses GU: No CVA tenderness.  Lymph: No cervical or inguinal lymphadenopathy. Skin: No rashes, bruises or suspicious lesions. Neurologic: Grossly intact, no focal deficits, moving all 4 extremities. Psychiatric: Normal mood and affect.  Laboratory Data: Lab Results  Component Value Date   WBC 6.7 07/14/2020   HGB 14.4 07/14/2020   HCT 42.5 07/14/2020   MCV 89.3 07/14/2020   PLT 223 07/14/2020    Lab Results  Component Value Date   CREATININE 1.61 (H) 07/14/2020    No results found for: PSA  No results found for: TESTOSTERONE  Lab Results  Component Value Date   HGBA1C 5.9 (H) 07/18/2019    Urinalysis    Component Value Date/Time   COLORURINE YELLOW 05/21/2008 0638   APPEARANCEUR Clear 07/20/2020 0930   LABSPEC >1.030 (H) 05/21/2008 0638   PHURINE 5.0 05/21/2008 0638   GLUCOSEU Negative 07/20/2020 0930   HGBUR LARGE (A) 05/21/2008 0638   BILIRUBINUR Negative 07/20/2020 0930   KETONESUR negative 06/23/2020 0937   KETONESUR NEGATIVE 05/21/2008 0638   PROTEINUR Negative 07/20/2020 0930   PROTEINUR NEGATIVE 05/21/2008 0638   UROBILINOGEN 0.2 06/23/2020 0937   UROBILINOGEN 0.2 05/21/2008 0638   NITRITE Negative 07/20/2020 0930   NITRITE NEGATIVE 05/21/2008 0638   LEUKOCYTESUR Negative 07/20/2020 0930    Lab Results  Component Value Date   LABMICR See below: 07/20/2020   WBCUA None seen 07/20/2020   LABEPIT None seen 07/20/2020   MUCUS Present 07/20/2020   BACTERIA None seen 07/20/2020    Pertinent Imaging: KUB today: Images reviewed and discussed with patient  No results found for this or any previous visit.  No results found for this or any previous visit.  No results found for this or any previous visit.  No results found for this or any previous  visit.  No results found for this or any previous visit.  No results found for this or any previous visit.  No results found for this or any previous visit.  Results for orders placed in visit on 06/29/20  CT RENAL STONE STUDY  Narrative CLINICAL DATA:  Right-sided flank pain with hematuria  EXAM: CT ABDOMEN AND PELVIS WITHOUT CONTRAST  TECHNIQUE: Multidetector CT imaging of the abdomen and pelvis was performed following the standard protocol without IV contrast.  COMPARISON:  CT 05/21/2008  FINDINGS: Lower chest: Lung bases demonstrate no acute consolidation or effusion. Coronary vascular calcifications.  Hepatobiliary: No focal liver abnormality is seen. No gallstones, gallbladder wall  thickening, or biliary dilatation.  Pancreas: Unremarkable. No pancreatic ductal dilatation or surrounding inflammatory changes.  Spleen: Normal in size without focal abnormality.  Adrenals/Urinary Tract: Adrenal glands are normal. Multiple intrarenal stones bilaterally. Largest stone on the left is seen in the mid to lower pole and measures 11 mm. Largest stone on the right is seen within the midpole and measures up to 4 mm in size. No significant hydronephrosis or hydroureter. 6 mm stone in the proximal to mid right ureter at the inferior aspect of L4. The urinary bladder is slightly thick walled.  Stomach/Bowel: Stomach is within normal limits. Appendix appears normal. No evidence of bowel wall thickening, distention, or inflammatory changes. Diverticular disease of the left colon without acute inflammatory change.  Vascular/Lymphatic: Mild aortic atherosclerosis without aneurysm. No suspicious nodes.  Reproductive: Slightly enlarged prostate with coarse calcifications.  Other: Negative for free air or free fluid. Small fat containing umbilical hernia. Small fat containing inguinal hernias.  Musculoskeletal: No acute or significant osseous findings.  IMPRESSION: 1. 6 mm  stone within the proximal to mid right ureter at approximate inferior L4 level, though without significant hydronephrosis or proximal hydroureter. 2. Numerous intrarenal stones bilaterally 3. Diverticular disease of the left colon without acute inflammatory change.  These results will be called to the ordering clinician or representative by the Radiology Department at the imaging location.   Electronically Signed By: Donavan Foil M.D. On: 06/29/2020 17:56   Assessment & Plan:    1. Kidney stones -We discussed the management of kidney stones. These options include observation, ureteroscopy, shockwave lithotripsy (ESWL) and percutaneous nephrolithotomy (PCNL). We discussed which options are relevant to the patient's stone(s). We discussed the natural history of kidney stones as well as the complications of untreated stones and the impact on quality of life without treatment as well as with each of the above listed treatments. We also discussed the efficacy of each treatment in its ability to clear the stone burden. With any of these management options I discussed the signs and symptoms of infection and the need for emergent treatment should these be experienced. For each option we discussed the ability of each procedure to clear the patient of their stone burden.   For observation I described the risks which include but are not limited to silent renal damage, life-threatening infection, need for emergent surgery, failure to pass stone and pain.   For ureteroscopy I described the risks which include bleeding, infection, damage to contiguous structures, positioning injury, ureteral stricture, ureteral avulsion, ureteral injury, need for prolonged ureteral stent, inability to perform ureteroscopy, need for an interval procedure, inability to clear stone burden, stent discomfort/pain, heart attack, stroke, pulmonary embolus and the inherent risks with general anesthesia.   For shockwave  lithotripsy I described the risks which include arrhythmia, kidney contusion, kidney hemorrhage, need for transfusion, pain, inability to adequately break up stone, inability to pass stone fragments, Steinstrasse, infection associated with obstructing stones, need for alternate surgical procedure, need for repeat shockwave lithotripsy, MI, CVA, PE and the inherent risks with anesthesia/conscious sedation.   For PCNL I described the risks including positioning injury, pneumothorax, hydrothorax, need for chest tube, inability to clear stone burden, renal laceration, arterial venous fistula or malformation, need for embolization of kidney, loss of kidney or renal function, need for repeat procedure, need for prolonged nephrostomy tube, ureteral avulsion, MI, CVA, PE and the inherent risks of general anesthesia.   - The patient would like to proceed with Right ESWL - Urinalysis, Routine  w reflex microscopic   No follow-ups on file.  Nicolette Bang, MD  [facility]

## 2020-07-20 NOTE — Progress Notes (Signed)
Urological Symptom Review  Patient is experiencing the following symptoms: Kidney stones   Review of Systems  Gastrointestinal (upper)  : Negative for upper GI symptoms  Gastrointestinal (lower) : Negative for lower GI symptoms  Constitutional : Negative for symptoms  Skin: Negative for skin symptoms  Eyes: Negative for eye symptoms  Ear/Nose/Throat : Negative for Ear/Nose/Throat symptoms  Hematologic/Lymphatic: Negative for Hematologic/Lymphatic symptoms  Cardiovascular : Negative for cardiovascular symptoms  Respiratory : Negative for respiratory symptoms  Endocrine: Negative for endocrine symptoms  Musculoskeletal: Negative for musculoskeletal symptoms  Neurological: Negative for neurological symptoms  Psychologic: Negative for psychiatric symptoms

## 2020-07-20 NOTE — Patient Instructions (Signed)
Lithotripsy  Lithotripsy is a treatment that can sometimes help eliminate kidney stones and the pain that they cause. A form of lithotripsy, also known as extracorporeal shock wave lithotripsy, is a nonsurgical procedure that crushes a kidney stone with shock waves. These shock waves pass through your body and focus on the kidney stone. They cause the kidney stone to break up while it is still in the urinary tract. This makes it easier for the smaller pieces of stone to pass in the urine. Tell a health care provider about:  Any allergies you have.  All medicines you are taking, including vitamins, herbs, eye drops, creams, and over-the-counter medicines.  Any blood disorders you have.  Any surgeries you have had.  Any medical conditions you have.  Whether you are pregnant or may be pregnant.  Any problems you or family members have had with anesthetic medicines. What are the risks? Generally, this is a safe procedure. However, problems may occur, including:  Infection.  Bleeding of the kidney.  Bruising of the kidney or skin.  Scarring of the kidney, which can lead to: ? Increased blood pressure. ? Poor kidney function. ? Return (recurrence) of kidney stones.  Damage to other structures or organs, such as the liver, colon, spleen, or pancreas.  Blockage (obstruction) of the the tube that carries urine from the kidney to the bladder (ureter).  Failure of the kidney stone to break into pieces (fragments). What happens before the procedure? Staying hydrated Follow instructions from your health care provider about hydration, which may include:  Up to 2 hours before the procedure - you may continue to drink clear liquids, such as water, clear fruit juice, black coffee, and plain tea. Eating and drinking restrictions Follow instructions from your health care provider about eating and drinking, which may include:  8 hours before the procedure - stop eating heavy meals or foods  such as meat, fried foods, or fatty foods.  6 hours before the procedure - stop eating light meals or foods, such as toast or cereal.  6 hours before the procedure - stop drinking milk or drinks that contain milk.  2 hours before the procedure - stop drinking clear liquids. General instructions  Plan to have someone take you home from the hospital or clinic.  Ask your health care provider about: ? Changing or stopping your regular medicines. This is especially important if you are taking diabetes medicines or blood thinners. ? Taking medicines such as aspirin and ibuprofen. These medicines and other NSAIDs can thin your blood. Do not take these medicines for 7 days before your procedure if your health care provider instructs you not to.  You may have tests, such as: ? Blood tests. ? Urine tests. ? Imaging tests, such as a CT scan. What happens during the procedure?  To lower your risk of infection: ? Your health care team will wash or sanitize their hands. ? Your skin will be washed with soap.  An IV tube will be inserted into one of your veins. This tube will give you fluids and medicines.  You will be given one or more of the following: ? A medicine to help you relax (sedative). ? A medicine to make you fall asleep (general anesthetic).  A water-filled cushion may be placed behind your kidney or on your abdomen. In some cases you may be placed in a tub of lukewarm water.  Your body will be positioned in a way that makes it easy to target the kidney   stone.  A flexible tube with holes in it (stent) may be placed in the ureter. This will help keep urine flowing from the kidney if the fragments of the stone have been blocking the ureter.  An X-ray or ultrasound exam will be done to locate your stone.  Shock waves will be aimed at the stone. If you are awake, you may feel a tapping sensation as the shock waves pass through your body. The procedure may vary among health care  providers and hospitals. What happens after the procedure?  You may have an X-ray to see whether the procedure was able to break up the kidney stone and how much of the stone has passed. If large stone fragments remain after treatment, you may need to have a second procedure at a later time.  Your blood pressure, heart rate, breathing rate, and blood oxygen level will be monitored until the medicines you were given have worn off.  You may be given antibiotics or pain medicine as needed.  If a stent was placed in your ureter during surgery, it may stay in place for a few weeks.  You may need strain your urine to collect pieces of the kidney stone for testing.  You will need to drink plenty of water.  Do not drive for 24 hours if you were given a sedative. Summary  Lithotripsy is a treatment that can sometimes help eliminate kidney stones and the pain that they cause.  A form of lithotripsy, also known as extracorporeal shock wave lithotripsy, is a nonsurgical procedure that crushes a kidney stone with shock waves.  Generally, this is a safe procedure. However, problems may occur, including damage to the kidney or other organs, infection, or obstruction of the tube that carries urine from the kidney to the bladder (ureter).  When you go home, you will need to drink plenty of water. You may be asked to strain your urine to collect pieces of the kidney stone for testing. This information is not intended to replace advice given to you by your health care provider. Make sure you discuss any questions you have with your health care provider. Document Revised: 03/11/2019 Document Reviewed: 10/19/2016 Elsevier Patient Education  2020 [employer]  

## 2020-07-21 ENCOUNTER — Ambulatory Visit (HOSPITAL_COMMUNITY)
Admission: RE | Admit: 2020-07-21 | Discharge: 2020-07-21 | Disposition: A | Discharge status: Home / Self Care | Payer: BC Managed Care – PPO | Attending: Urology | Admitting: Urology

## 2020-07-21 ENCOUNTER — Encounter (HOSPITAL_COMMUNITY): Payer: Self-pay | Admitting: Urology

## 2020-07-21 ENCOUNTER — Ambulatory Visit (HOSPITAL_COMMUNITY): Payer: BC Managed Care – PPO

## 2020-07-21 ENCOUNTER — Encounter (HOSPITAL_COMMUNITY)
Admission: RE | Disposition: A | Discharge status: Home / Self Care | Payer: Self-pay | Source: Home / Self Care | Attending: Urology

## 2020-07-21 DIAGNOSIS — I10 Essential (primary) hypertension: Secondary | ICD-10-CM | POA: Diagnosis not present

## 2020-07-21 DIAGNOSIS — Z79899 Other long term (current) drug therapy: Secondary | ICD-10-CM | POA: Diagnosis not present

## 2020-07-21 DIAGNOSIS — N202 Calculus of kidney with calculus of ureter: Secondary | ICD-10-CM | POA: Insufficient documentation

## 2020-07-21 DIAGNOSIS — Z01818 Encounter for other preprocedural examination: Secondary | ICD-10-CM | POA: Diagnosis not present

## 2020-07-21 DIAGNOSIS — Z79891 Long term (current) use of opiate analgesic: Secondary | ICD-10-CM | POA: Insufficient documentation

## 2020-07-21 DIAGNOSIS — Z7902 Long term (current) use of antithrombotics/antiplatelets: Secondary | ICD-10-CM | POA: Diagnosis not present

## 2020-07-21 DIAGNOSIS — E785 Hyperlipidemia, unspecified: Secondary | ICD-10-CM | POA: Insufficient documentation

## 2020-07-21 DIAGNOSIS — Z87891 Personal history of nicotine dependence: Secondary | ICD-10-CM | POA: Diagnosis not present

## 2020-07-21 DIAGNOSIS — N420 Calculus of prostate: Secondary | ICD-10-CM | POA: Diagnosis not present

## 2020-07-21 DIAGNOSIS — Z7982 Long term (current) use of aspirin: Secondary | ICD-10-CM | POA: Insufficient documentation

## 2020-07-21 DIAGNOSIS — I2510 Atherosclerotic heart disease of native coronary artery without angina pectoris: Secondary | ICD-10-CM | POA: Diagnosis not present

## 2020-07-21 DIAGNOSIS — Z955 Presence of coronary angioplasty implant and graft: Secondary | ICD-10-CM | POA: Diagnosis not present

## 2020-07-21 DIAGNOSIS — Z87442 Personal history of urinary calculi: Secondary | ICD-10-CM | POA: Diagnosis not present

## 2020-07-21 DIAGNOSIS — N200 Calculus of kidney: Secondary | ICD-10-CM

## 2020-07-21 DIAGNOSIS — N201 Calculus of ureter: Secondary | ICD-10-CM | POA: Diagnosis not present

## 2020-07-21 HISTORY — PX: EXTRACORPOREAL SHOCK WAVE LITHOTRIPSY: SHX1557

## 2020-07-21 SURGERY — LITHOTRIPSY, ESWL
Anesthesia: LOCAL | Laterality: Right

## 2020-07-21 MED ORDER — OXYCODONE-ACETAMINOPHEN 5-325 MG PO TABS: 1.0000 | ORAL_TABLET | ORAL | 0 refills | Status: DC | PRN

## 2020-07-21 MED ORDER — SODIUM CHLORIDE 0.9 % IV SOLN
Freq: Once | INTRAVENOUS | Status: AC
  Administered 2020-07-21: 1000 mL via INTRAVENOUS

## 2020-07-21 MED ORDER — TAMSULOSIN HCL 0.4 MG PO CAPS: 0.4000 mg | ORAL_CAPSULE | Freq: Every day | ORAL | 1 refills | Status: DC

## 2020-07-21 MED ORDER — ONDANSETRON HCL 4 MG PO TABS: 4.0000 mg | ORAL_TABLET | Freq: Every day | ORAL | 1 refills | Status: DC | PRN

## 2020-07-21 MED ORDER — DIPHENHYDRAMINE HCL 25 MG PO CAPS
25.0000 mg | ORAL_CAPSULE | ORAL | Status: AC
  Administered 2020-07-21: 25 mg via ORAL
  Filled 2020-07-21: qty 1

## 2020-07-21 MED ORDER — DIAZEPAM 5 MG PO TABS
10.0000 mg | ORAL_TABLET | Freq: Once | ORAL | Status: AC
  Administered 2020-07-21: 10 mg via ORAL
  Filled 2020-07-21: qty 2

## 2020-07-21 NOTE — Progress Notes (Signed)
Erythema noted to right lower groin, no blistering noted.

## 2020-07-21 NOTE — Interval H&P Note (Signed)
History and Physical Interval Note:  07/21/2020 9:51 AM  Jeremy Riley  has presented today for surgery, with the diagnosis of right ureteral calculus.  The various methods of treatment have been discussed with the patient and family. After consideration of risks, benefits and other options for treatment, the patient has consented to  Procedure(s): EXTRACORPOREAL SHOCK WAVE LITHOTRIPSY (ESWL) (Right) as a surgical intervention.  The patient's history has been reviewed, patient examined, no change in status, stable for surgery.  I have reviewed the patient's chart and labs.  Questions were answered to the patient's satisfaction.     Nicolette Bang

## 2020-07-21 NOTE — Discharge Instructions (Signed)
Lithotripsy, Care After This sheet gives you information about how to care for yourself after your procedure. Your health care provider may also give you more specific instructions. If you have problems or questions, contact your health care provider. What can I expect after the procedure? After the procedure, it is common to have:  Some blood in your urine. This should only last for a few days.  Soreness in your back, sides, or upper abdomen for a few days.  Blotches or bruises on your back where the pressure wave entered the skin.  Pain, discomfort, or nausea when pieces (fragments) of the kidney stone move through the tube that carries urine from the kidney to the bladder (ureter). Stone fragments may pass soon after the procedure, but they may continue to pass for up to 4-8 weeks. ? If you have severe pain or nausea, contact your health care provider. This may be caused by a large stone that was not broken up, and this may mean that you need more treatment.  Some pain or discomfort during urination.  Some pain or discomfort in the lower abdomen or (in men) at the base of the penis. Follow these instructions at home: Medicines  Take over-the-counter and prescription medicines only as told by your health care provider.  If you were prescribed an antibiotic medicine, take it as told by your health care provider. Do not stop taking the antibiotic even if you start to feel better.  Do not drive for 24 hours if you were given a medicine to help you relax (sedative).  Do not drive or use heavy machinery while taking prescription pain medicine. Eating and drinking      Drink enough water and fluids to keep your urine clear or pale yellow. This helps any remaining pieces of the stone to pass. It can also help prevent new stones from forming.  Eat plenty of fresh fruits and vegetables.  Follow instructions from your health care provider about eating and drinking restrictions. You may be  instructed: ? To reduce how much salt (sodium) you eat or drink. Check ingredients and nutrition facts on packaged foods and beverages. ? To reduce how much meat you eat.  Eat the recommended amount of calcium for your age and gender. Ask your health care provider how much calcium you should have. General instructions  Get plenty of rest.  Most people can resume normal activities 1-2 days after the procedure. Ask your health care provider what activities are safe for you.  Your health care provider may direct you to lie in a certain position (postural drainage) and tap firmly (percuss) over your kidney area to help stone fragments pass. Follow instructions as told by your health care provider.  If directed, strain all urine through the strainer that was provided by your health care provider. ? Keep all fragments for your health care provider to see. Any stones that are found may be sent to a medical lab for examination. The stone may be as small as a grain of salt.  Keep all follow-up visits as told by your health care provider. This is important. Contact a health care provider if:  You have pain that is severe or does not get better with medicine.  You have nausea that is severe or does not go away.  You have blood in your urine longer than your health care provider told you to expect.  You have more blood in your urine.  You have pain during urination that does   urination that does not go away.  You urinate more frequently than usual and this does not go away.  You develop a rash or any other possible signs of an allergic reaction. Get help right away if:  You have severe pain in your back, sides, or upper abdomen.  You have severe pain while urinating.  Your urine is very dark red.  You have blood in your stool (feces).  You cannot pass any urine at all.  You feel a strong urge to urinate after emptying your bladder.  You have a fever or chills.  You develop shortness of  breath, difficulty breathing, or chest pain.  You have severe nausea that leads to persistent vomiting.  You faint. Summary  After this procedure, it is common to have some pain, discomfort, or nausea when pieces (fragments) of the kidney stone move through the tube that carries urine from the kidney to the bladder (ureter). If this pain or nausea is severe, however, you should contact your health care provider.  Most people can resume normal activities 1-2 days after the procedure. Ask your health care provider what activities are safe for you.  Drink enough water and fluids to keep your urine clear or pale yellow. This helps any remaining pieces of the stone to pass, and it can help prevent new stones from forming.  If directed, strain your urine and keep all fragments for your health care provider to see. Fragments or stones may be as small as a grain of salt.  Get help right away if you have severe pain in your back, sides, or upper abdomen or have severe pain while urinating. This information is not intended to replace advice given to you by your health care provider. Make sure you discuss any questions you have with your health care provider. Document Revised: 03/11/2019 Document Reviewed: 10/19/2016 [employer] Patient Education  2020 [employer] Monitored Anesthesia Care, Care After These instructions provide you with information about caring for yourself after your procedure. Your health care provider may also give you more specific instructions. Your treatment has been planned according to current medical practices, but problems sometimes occur. Call your health care provider if you have any problems or questions after your procedure. What can I expect after the procedure? After your procedure, you may:  Feel sleepy for several hours.  Feel clumsy and have poor balance for several hours.  Feel forgetful about what happened after the procedure.  Have poor judgment for several  hours.  Feel nauseous or vomit.  Have a sore throat if you had a breathing tube during the procedure. Follow these instructions at home: For at least 24 hours after the procedure:        Have a responsible adult stay with you. It is important to have someone help care for you until you are awake and alert.  Rest as needed.  Do not: ? Participate in activities in which you could fall or become injured. ? Drive. ? Use heavy machinery. ? Drink alcohol. ? Take sleeping pills or medicines that cause drowsiness. ? Make important decisions or sign legal documents. ? Take care of children on your own. Eating and drinking  Follow the diet that is recommended by your health care provider.  If you vomit, drink water, juice, or soup when you can drink without vomiting.  Make sure you have little or no nausea before eating solid foods. General instructions  Take over-the-counter and prescription medicines only as told by your health   care provider.  If you have sleep apnea, surgery and certain medicines can increase your risk for breathing problems. Follow instructions from your health care provider about wearing your sleep device: ? Anytime you are sleeping, including during daytime naps. ? While taking prescription pain medicines, sleeping medicines, or medicines that make you drowsy.  If you smoke, do not smoke without supervision.  Keep all follow-up visits as told by your health care provider. This is important. Contact a health care provider if:  You keep feeling nauseous or you keep vomiting.  You feel light-headed.  You develop a rash.  You have a fever. Get help right away if:  You have trouble breathing. Summary  For several hours after your procedure, you may feel sleepy and have poor judgment.  Have a responsible adult stay with you for at least 24 hours or until you are awake and alert. This information is not intended to replace advice given to you by your  health care provider. Make sure you discuss any questions you have with your health care provider. Document Revised: 02/26/2018 Document Reviewed: 03/20/2016 [employer] Patient Education  2020 [employer]  

## 2020-07-29 ENCOUNTER — Ambulatory Visit: Payer: BC Managed Care – PPO | Admitting: Urology

## 2020-07-31 ENCOUNTER — Encounter (HOSPITAL_COMMUNITY): Payer: Self-pay | Admitting: Urology

## 2020-08-05 ENCOUNTER — Ambulatory Visit (INDEPENDENT_AMBULATORY_CARE_PROVIDER_SITE_OTHER): Payer: BC Managed Care – PPO | Admitting: Urology

## 2020-08-05 ENCOUNTER — Other Ambulatory Visit: Payer: Self-pay

## 2020-08-05 ENCOUNTER — Ambulatory Visit (HOSPITAL_COMMUNITY)
Admission: RE | Admit: 2020-08-05 | Discharge: 2020-08-05 | Disposition: A | Discharge status: Home / Self Care | Payer: BC Managed Care – PPO | Source: Ambulatory Visit | Attending: Urology | Admitting: Urology

## 2020-08-05 ENCOUNTER — Encounter: Payer: Self-pay | Admitting: Urology

## 2020-08-05 VITALS — BP 154/94 | HR 92 | Temp 98.5°F | Ht 68.0 in | Wt 249.0 lb

## 2020-08-05 DIAGNOSIS — N200 Calculus of kidney: Secondary | ICD-10-CM

## 2020-08-05 LAB — URINALYSIS, ROUTINE W REFLEX MICROSCOPIC
Bilirubin, UA: NEGATIVE
Glucose, UA: NEGATIVE
Ketones, UA: NEGATIVE
Leukocytes,UA: NEGATIVE
Nitrite, UA: NEGATIVE
Protein,UA: NEGATIVE
RBC, UA: NEGATIVE
Specific Gravity, UA: 1.02 (ref 1.005–1.030)
Urobilinogen, Ur: 0.2 mg/dL (ref 0.2–1.0)
pH, UA: 6 (ref 5.0–7.5)

## 2020-08-05 NOTE — Patient Instructions (Signed)
Dietary Guidelines to Help Prevent Kidney Stones Kidney stones are deposits of minerals and salts that form inside your kidneys. Your risk of developing kidney stones may be greater depending on your diet, your lifestyle, the medicines you take, and whether you have certain medical conditions. Most people can reduce their chances of developing kidney stones by following the instructions below. Depending on your overall health and the type of kidney stones you tend to develop, your dietitian may give you more specific instructions. What are tips for following this plan? Reading food labels  Choose foods with "no salt added" or "low-salt" labels. Limit your sodium intake to less than 1500 mg per day.  Choose foods with calcium for each meal and snack. Try to eat about 300 mg of calcium at each meal. Foods that contain 200-500 mg of calcium per serving include: ? 8 oz (237 ml) of milk, fortified nondairy milk, and fortified fruit juice. ? 8 oz (237 ml) of kefir, yogurt, and soy yogurt. ? 4 oz (118 ml) of tofu. ? 1 oz of cheese. ? 1 cup (300 g) of dried figs. ? 1 cup (91 g) of cooked broccoli. ? 1-3 oz can of sardines or mackerel.  Most people need 1000 to 1500 mg of calcium each day. Talk to your dietitian about how much calcium is recommended for you. Shopping  Buy plenty of fresh fruits and vegetables. Most people do not need to avoid fruits and vegetables, even if they contain nutrients that may contribute to kidney stones.  When shopping for convenience foods, choose: ? Whole pieces of fruit. ? Premade salads with dressing on the side. ? Low-fat fruit and yogurt smoothies.  Avoid buying frozen meals or prepared deli foods.  Look for foods with live cultures, such as yogurt and kefir. Cooking  Do not add salt to food when cooking. Place a salt shaker on the table and allow each person to add his or her own salt to taste.  Use vegetable protein, such as beans, textured vegetable  protein (TVP), or tofu instead of meat in pasta, casseroles, and soups. Meal planning   Eat less salt, if told by your dietitian. To do this: ? Avoid eating processed or premade food. ? Avoid eating fast food.  Eat less animal protein, including cheese, meat, poultry, or fish, if told by your dietitian. To do this: ? Limit the number of times you have meat, poultry, fish, or cheese each week. Eat a diet free of meat at least 2 days a week. ? Eat only one serving each day of meat, poultry, fish, or seafood. ? When you prepare animal protein, cut pieces into small portion sizes. For most meat and fish, one serving is about the size of one deck of cards.  Eat at least 5 servings of fresh fruits and vegetables each day. To do this: ? Keep fruits and vegetables on hand for snacks. ? Eat 1 piece of fruit or a handful of berries with breakfast. ? Have a salad and fruit at lunch. ? Have two kinds of vegetables at dinner.  Limit foods that are high in a substance called oxalate. These include: ? Spinach. ? Rhubarb. ? Beets. ? Potato chips and french fries. ? Nuts.  If you regularly take a diuretic medicine, make sure to eat at least 1-2 fruits or vegetables high in potassium each day. These include: ? Avocado. ? Banana. ? Orange, prune, carrot, or tomato juice. ? Baked potato. ? Cabbage. ? Beans and split   peas. General instructions   Drink enough fluid to keep your urine clear or pale yellow. This is the most important thing you can do.  Talk to your health care provider and dietitian about taking daily supplements. Depending on your health and the cause of your kidney stones, you may be advised: ? Not to take supplements with vitamin C. ? To take a calcium supplement. ? To take a daily probiotic supplement. ? To take other supplements such as magnesium, fish oil, or vitamin B6.  Take all medicines and supplements as told by your health care provider.  Limit alcohol intake to no  more than 1 drink a day for nonpregnant women and 2 drinks a day for men. One drink equals 12 oz of beer, 5 oz of wine, or 1 oz of hard liquor.  Lose weight if told by your health care provider. Work with your dietitian to find strategies and an eating plan that works best for you. What foods are not recommended? Limit your intake of the following foods, or as told by your dietitian. Talk to your dietitian about specific foods you should avoid based on the type of kidney stones and your overall health. Grains Breads. Bagels. Rolls. Baked goods. Salted crackers. Cereal. Pasta. Vegetables Spinach. Rhubarb. Beets. Canned vegetables. Pickles. Olives. Meats and other protein foods Nuts. Nut butters. Large portions of meat, poultry, or fish. Salted or cured meats. Deli meats. Hot dogs. Sausages. Dairy Cheese. Beverages Regular soft drinks. Regular vegetable juice. Seasonings and other foods Seasoning blends with salt. Salad dressings. Canned soups. Soy sauce. Ketchup. Barbecue sauce. Canned pasta sauce. Casseroles. Pizza. Lasagna. Frozen meals. Potato chips. French fries. Summary  You can reduce your risk of kidney stones by making changes to your diet.  The most important thing you can do is drink enough fluid. You should drink enough fluid to keep your urine clear or pale yellow.  Ask your health care provider or dietitian how much protein from animal sources you should eat each day, and also how much salt and calcium you should have each day. This information is not intended to replace advice given to you by your health care provider. Make sure you discuss any questions you have with your health care provider. Document Revised: 03/20/2019 Document Reviewed: 11/08/2016 Else[employer] Patient Education  2020 [employer]  

## 2020-08-05 NOTE — Progress Notes (Signed)
Urological Symptom Review  Patient is experiencing the following symptoms: Get up at night to urinate Kidney stone  Review of Systems  Gastrointestinal (upper)  : Negative for upper GI symptoms  Gastrointestinal (lower) : Negative for lower GI symptoms  Constitutional : Negative for symptoms  Skin: Negative for skin symptoms  Eyes: Negative for eye symptoms  Ear/Nose/Throat : Negative for Ear/Nose/Throat symptoms  Hematologic/Lymphatic: Negative for Hematologic/Lymphatic symptoms  Cardiovascular : Negative for cardiovascular symptoms  Respiratory : Negative for respiratory symptoms  Endocrine: Negative for endocrine symptoms  Musculoskeletal: Negative for musculoskeletal symptoms  Neurological: Negative for neurological symptoms  Psychologic: Negative for psychiatric symptoms

## 2020-08-05 NOTE — Progress Notes (Signed)
08/05/2020 4:01 PM   Jeremy Riley 06-17-1961 [id number]  Referring provider: Kathyrn Drown, MD [place],  [place] [address]  nephrolithiasis  HPI: [name] is a 59yo here for followup for nephrolithiasis. He underwent ESWl on 07/21/2020. He denies nay flank pain. No worsening LUTS. No hematuria.   PMH: Past Medical History:  Diagnosis Date  . Coronary artery disease    a. LHC 05/22/2019: 99% dCx s/p DES, luminal irregularities in LAD and dominant RCA  . History of pneumonia as a child   . Hyperlipidemia   . Hypertension   . Lumbar disc disease    Fall from ladder  . Lumbar pain 05/18/2020   Patient with known history of sciatica.  On multiple medicines for his heart cannot take anti-inflammatories, intermittent use of tramadol prescribed  . Pre-diabetes    Hgb A1c 6.3 in 05/2019.  Marland Kitchen Renal disorder    kidney stones    Surgical History: Past Surgical History:  Procedure Laterality Date  . CORONARY STENT INTERVENTION N/A 05/22/2019   Procedure: CORONARY STENT INTERVENTION;  Surgeon: Jeremy Crome, MD;  Location: [facility];  Service: Cardiovascular;  Laterality: N/A;  . EXTRACORPOREAL SHOCK WAVE LITHOTRIPSY Right 07/21/2020   Procedure: EXTRACORPOREAL SHOCK WAVE LITHOTRIPSY (ESWL);  Surgeon: Jeremy Gustin, MD;  Location: [facility];  Service: Urology;  Laterality: Right;  . LEFT HEART CATH AND CORONARY ANGIOGRAPHY N/A 05/22/2019   Procedure: LEFT HEART CATH AND CORONARY ANGIOGRAPHY;  Surgeon: Jeremy Crome, MD;  Location: [facility];  Service: Cardiovascular;  Laterality: N/A;    Home Medications:  Allergies as of 08/05/2020   No Known Allergies     Medication List       Accurate as of August 05, 2020  4:01 PM. If you have any questions, ask your nurse or doctor.        acetaminophen 500 MG tablet Commonly known as: TYLENOL Take 500 mg by mouth every 6 (six) hours as needed for mild pain or fever.   aspirin 81 MG chewable  tablet Chew 1 tablet (81 mg total) by mouth daily.   atorvastatin 80 MG tablet Commonly known as: LIPITOR Take 1 tablet (80 mg total) by mouth daily at 6 PM.   carvedilol 3.125 MG tablet Commonly known as: COREG Take 1 tablet (3.125 mg total) by mouth 2 (two) times daily with a meal.   HYDROcodone-acetaminophen 5-325 MG tablet Commonly known as: NORCO/VICODIN Take 1 tablet by mouth every 6 (six) hours as needed for moderate pain.   losartan-hydrochlorothiazide 100-12.5 MG tablet Commonly known as: HYZAAR Take 1 tablet by mouth daily.   nitroGLYCERIN 0.4 MG SL tablet Commonly known as: NITROSTAT Place 1 tablet (0.4 mg total) under the tongue every 5 (five) minutes x 3 doses as needed for chest pain.   ondansetron 4 MG tablet Commonly known as: Zofran Take 1 tablet (4 mg total) by mouth every 8 (eight) hours as needed for nausea or vomiting.   ondansetron 4 MG tablet Commonly known as: Zofran Take 1 tablet (4 mg total) by mouth daily as needed for nausea or vomiting.   oxyCODONE-acetaminophen 5-325 MG tablet Commonly known as: Percocet Take 1 tablet by mouth every 4 (four) hours as needed for severe pain.   tamsulosin 0.4 MG Caps capsule Commonly known as: FLOMAX Take 0.4 mg by mouth.   tamsulosin 0.4 MG Caps capsule Commonly known as: Flomax Take 1 capsule (0.4 mg total) by mouth  daily after supper.   ticagrelor 90 MG Tabs tablet Commonly known as: BRILINTA Take 1 tablet (90 mg total) by mouth 2 (two) times daily. (MAY STOP THE END OF June)   traMADol 50 MG tablet Commonly known as: ULTRAM Take 1 tablet (50 mg total) by mouth in the morning and at bedtime. For severe pain       Allergies: No Known Allergies  Family History: Family History  Problem Relation Age of Onset  . Heart disease Father        CABG age 24's    Social History:  reports that he quit smoking about 11 years ago. His smoking use included cigarettes. He quit after 30.00 years of use. His  smokeless tobacco use includes chew. He reports current alcohol use. He reports previous drug use.  ROS: All other review of systems were reviewed and are negative except what is noted above in HPI  Physical Exam: BP (!) 154/94   Pulse 92   Temp 98.5 F (36.9 C)   Ht 5\' 8"  (1.727 m)   Wt 249 lb (112.9 kg)   BMI 37.86 kg/m   Constitutional:  Alert and oriented, No acute distress. HEENT: [place] AT, moist mucus membranes.  Trachea midline, no masses. Cardiovascular: No clubbing, cyanosis, or edema. Respiratory: Normal respiratory effort, no increased work of breathing. GI: Abdomen is soft, nontender, nondistended, no abdominal masses GU: No CVA tenderness.  Lymph: No cervical or inguinal lymphadenopathy. Skin: No rashes, bruises or suspicious lesions. Neurologic: Grossly intact, no focal deficits, moving all 4 extremities. Psychiatric: Normal mood and affect.  Laboratory Data: Lab Results  Component Value Date   WBC 6.7 07/14/2020   HGB 14.4 07/14/2020   HCT 42.5 07/14/2020   MCV 89.3 07/14/2020   PLT 223 07/14/2020    Lab Results  Component Value Date   CREATININE 1.61 (H) 07/14/2020    No results found for: PSA  No results found for: TESTOSTERONE  Lab Results  Component Value Date   HGBA1C 5.9 (H) 07/18/2019    Urinalysis    Component Value Date/Time   COLORURINE YELLOW 05/21/2008 0638   APPEARANCEUR Clear 07/20/2020 0930   LABSPEC >1.030 (H) 05/21/2008 0638   PHURINE 5.0 05/21/2008 0638   GLUCOSEU Negative 07/20/2020 0930   HGBUR LARGE (A) 05/21/2008 0638   BILIRUBINUR Negative 07/20/2020 0930   KETONESUR negative 06/23/2020 0937   KETONESUR NEGATIVE 05/21/2008 0638   PROTEINUR Negative 07/20/2020 0930   PROTEINUR NEGATIVE 05/21/2008 0638   UROBILINOGEN 0.2 06/23/2020 0937   UROBILINOGEN 0.2 05/21/2008 0638   NITRITE Negative 07/20/2020 0930   NITRITE NEGATIVE 05/21/2008 0638   LEUKOCYTESUR Negative 07/20/2020 0930    Lab Results  Component Value  Date   LABMICR See below: 07/20/2020   WBCUA None seen 07/20/2020   LABEPIT None seen 07/20/2020   MUCUS Present 07/20/2020   BACTERIA None seen 07/20/2020    Pertinent Imaging:  Results for orders placed during the hospital encounter of 07/21/20  DG Abd 1 View  Narrative CLINICAL DATA:  Nephrolithiasis.  Preoperative lithotripsy  EXAM: ABDOMEN - 1 VIEW  COMPARISON:  Abdominal radiograph July 20, 2020 and CT abdomen and pelvis June 29, 2020  FINDINGS: Stable 6 x 5 mm apparent calculus in the medial upper to mid right pelvis at the level of the mid to lower sacrum, similar to 1 day prior. There is a calculus in the mid left kidney measuring 1.1 x 0.8 cm with a nearby second calculus measuring 0.8 x  0.7 cm. There is a 3 mm calculus more superiorly on the left. There is a 2 mm calculus in the upper pole the right kidney. Stool overlies much of the right kidney making evaluation of the right kidney region less than optimal. Multiple prostatic calculi evident. No new calcifications evident compared to 1 day prior.  There is moderate stool in the colon. There is no bowel dilatation or air-fluid level to suggest bowel obstruction. No free air.  IMPRESSION: Distal right ureteral calculus, unchanged. Intrarenal calculi, larger and more notable on the left on the right, stable. Multiple prostatic calculi evident. No bowel obstruction or free air.   Electronically Signed By: Lowella Grip III M.D. On: 07/21/2020 09:07  No results found for this or any previous visit.  No results found for this or any previous visit.  No results found for this or any previous visit.  No results found for this or any previous visit.  No results found for this or any previous visit.  No results found for this or any previous visit.  Results for orders placed in visit on 06/29/20  CT RENAL STONE STUDY  Narrative CLINICAL DATA:  Right-sided flank pain with hematuria  EXAM: CT  ABDOMEN AND PELVIS WITHOUT CONTRAST  TECHNIQUE: Multidetector CT imaging of the abdomen and pelvis was performed following the standard protocol without IV contrast.  COMPARISON:  CT 05/21/2008  FINDINGS: Lower chest: Lung bases demonstrate no acute consolidation or effusion. Coronary vascular calcifications.  Hepatobiliary: No focal liver abnormality is seen. No gallstones, gallbladder wall thickening, or biliary dilatation.  Pancreas: Unremarkable. No pancreatic ductal dilatation or surrounding inflammatory changes.  Spleen: Normal in size without focal abnormality.  Adrenals/Urinary Tract: Adrenal glands are normal. Multiple intrarenal stones bilaterally. Largest stone on the left is seen in the mid to lower pole and measures 11 mm. Largest stone on the right is seen within the midpole and measures up to 4 mm in size. No significant hydronephrosis or hydroureter. 6 mm stone in the proximal to mid right ureter at the inferior aspect of L4. The urinary bladder is slightly thick walled.  Stomach/Bowel: Stomach is within normal limits. Appendix appears normal. No evidence of bowel wall thickening, distention, or inflammatory changes. Diverticular disease of the left colon without acute inflammatory change.  Vascular/Lymphatic: Mild aortic atherosclerosis without aneurysm. No suspicious nodes.  Reproductive: Slightly enlarged prostate with coarse calcifications.  Other: Negative for free air or free fluid. Small fat containing umbilical hernia. Small fat containing inguinal hernias.  Musculoskeletal: No acute or significant osseous findings.  IMPRESSION: 1. 6 mm stone within the proximal to mid right ureter at approximate inferior L4 level, though without significant hydronephrosis or proximal hydroureter. 2. Numerous intrarenal stones bilaterally 3. Diverticular disease of the left colon without acute inflammatory change.  These results will be called to the ordering  clinician or representative by the Radiology Department at the imaging location.   Electronically Signed By: Donavan Foil M.D. On: 06/29/2020 17:56   Assessment & Plan:    1. Nephrolithiasis -KUb today, will call with results. If it shows no residual calculi I will see him back in 4 weeks with a renal US   No follow-ups on file.  Nicolette Bang, MD  [facility] [place]

## 2020-08-06 ENCOUNTER — Telehealth: Payer: Self-pay

## 2020-08-06 DIAGNOSIS — N200 Calculus of kidney: Secondary | ICD-10-CM

## 2020-08-06 NOTE — Telephone Encounter (Signed)
-----   Message from Cleon Gustin, MD sent at 08/06/2020  9:15 AM EDT ----- KUb shows right residual ureteral calculus. He needs to see me in 4 weeks with a KUB and renal US

## 2020-08-06 NOTE — Telephone Encounter (Signed)
Pt called and made aware

## 2020-08-12 ENCOUNTER — Ambulatory Visit (HOSPITAL_COMMUNITY): Payer: BC Managed Care – PPO

## 2020-08-12 ENCOUNTER — Other Ambulatory Visit: Payer: Self-pay | Admitting: Urology

## 2020-08-21 ENCOUNTER — Other Ambulatory Visit: Payer: Self-pay | Admitting: Urology

## 2020-09-03 ENCOUNTER — Ambulatory Visit (HOSPITAL_COMMUNITY)
Admission: RE | Admit: 2020-09-03 | Discharge: 2020-09-03 | Disposition: A | Discharge status: Home / Self Care | Payer: BC Managed Care – PPO | Source: Ambulatory Visit | Attending: Urology | Admitting: Urology

## 2020-09-03 ENCOUNTER — Other Ambulatory Visit: Payer: Self-pay

## 2020-09-03 DIAGNOSIS — N200 Calculus of kidney: Secondary | ICD-10-CM | POA: Diagnosis not present

## 2020-09-10 ENCOUNTER — Encounter: Payer: Self-pay | Admitting: Urology

## 2020-09-10 ENCOUNTER — Other Ambulatory Visit: Payer: Self-pay

## 2020-09-10 ENCOUNTER — Ambulatory Visit (INDEPENDENT_AMBULATORY_CARE_PROVIDER_SITE_OTHER): Payer: BC Managed Care – PPO | Admitting: Urology

## 2020-09-10 VITALS — BP 150/89 | HR 85 | Temp 98.4°F | Ht 68.0 in | Wt 249.0 lb

## 2020-09-10 DIAGNOSIS — N200 Calculus of kidney: Secondary | ICD-10-CM

## 2020-09-10 LAB — URINALYSIS, ROUTINE W REFLEX MICROSCOPIC
Bilirubin, UA: NEGATIVE
Glucose, UA: NEGATIVE
Ketones, UA: NEGATIVE
Leukocytes,UA: NEGATIVE
Nitrite, UA: NEGATIVE
Protein,UA: NEGATIVE
RBC, UA: NEGATIVE
Specific Gravity, UA: 1.025 (ref 1.005–1.030)
Urobilinogen, Ur: 1 mg/dL (ref 0.2–1.0)
pH, UA: 6 (ref 5.0–7.5)

## 2020-09-10 MED ORDER — HYDROCODONE-ACETAMINOPHEN 5-325 MG PO TABS: 1.0000 | ORAL_TABLET | Freq: Four times a day (QID) | ORAL | 0 refills | Status: DC | PRN

## 2020-09-10 NOTE — Patient Instructions (Signed)

## 2020-09-10 NOTE — Progress Notes (Signed)

## 2020-09-10 NOTE — Progress Notes (Signed)
09/10/2020 11:04 AM   Jeremy Riley June 28, 1961 [id number]  Referring provider: Kathyrn Drown, MD [place],  [place] [id number]  Nephrolithiasis  HPI: Jeremy Riley is a 59yo here for followup for nephrolithiasis. He has not passed any fragments since last visit. He has intermittent right flank pain. KUB from 9/23 shows right ureteral fragments.No LUTS. No hematuria   PMH: Past Medical History:  Diagnosis Date  . Coronary artery disease    a. LHC 05/22/2019: 99% dCx s/p DES, luminal irregularities in LAD and dominant RCA  . History of pneumonia as a child   . Hyperlipidemia   . Hypertension   . Kidney stone   . Lumbar disc disease    Fall from ladder  . Lumbar pain 05/18/2020   Patient with known history of sciatica.  On multiple medicines for his heart cannot take anti-inflammatories, intermittent use of tramadol prescribed  . Pre-diabetes    Hgb A1c 6.3 in 05/2019.  Marland Kitchen Renal disorder    kidney stones    Surgical History: Past Surgical History:  Procedure Laterality Date  . CORONARY STENT INTERVENTION N/A 05/22/2019   Procedure: CORONARY STENT INTERVENTION;  Surgeon: Jeremy Crome, MD;  Location: [facility];  Service: Cardiovascular;  Laterality: N/A;  . EXTRACORPOREAL SHOCK WAVE LITHOTRIPSY Right 07/21/2020   Procedure: EXTRACORPOREAL SHOCK WAVE LITHOTRIPSY (ESWL);  Surgeon: Jeremy Gustin, MD;  Location: [facility];  Service: Urology;  Laterality: Right;  . LEFT HEART CATH AND CORONARY ANGIOGRAPHY N/A 05/22/2019   Procedure: LEFT HEART CATH AND CORONARY ANGIOGRAPHY;  Surgeon: Jeremy Crome, MD;  Location: [facility];  Service: Cardiovascular;  Laterality: N/A;    Home Medications:  Allergies as of 09/10/2020   No Known Allergies     Medication List       Accurate as of September 10, 2020 11:04 AM. If you have any questions, ask your nurse or doctor.        acetaminophen 500 MG tablet Commonly known as: TYLENOL Take 500 mg by  mouth every 6 (six) hours as needed for mild pain or fever.   aspirin 81 MG chewable tablet Chew 1 tablet (81 mg total) by mouth daily.   atorvastatin 80 MG tablet Commonly known as: LIPITOR Take 1 tablet (80 mg total) by mouth daily at 6 PM.   carvedilol 3.125 MG tablet Commonly known as: COREG Take 1 tablet (3.125 mg total) by mouth 2 (two) times daily with a meal.   HYDROcodone-acetaminophen 5-325 MG tablet Commonly known as: NORCO/VICODIN Take 1 tablet by mouth every 6 (six) hours as needed for moderate pain.   losartan-hydrochlorothiazide 100-12.5 MG tablet Commonly known as: HYZAAR Take 1 tablet by mouth daily.   nitroGLYCERIN 0.4 MG SL tablet Commonly known as: NITROSTAT Place 1 tablet (0.4 mg total) under the tongue every 5 (five) minutes x 3 doses as needed for chest pain.   ondansetron 4 MG tablet Commonly known as: Zofran Take 1 tablet (4 mg total) by mouth every 8 (eight) hours as needed for nausea or vomiting.   ondansetron 4 MG tablet Commonly known as: Zofran Take 1 tablet (4 mg total) by mouth daily as needed for nausea or vomiting.   oxyCODONE-acetaminophen 5-325 MG tablet Commonly known as: Percocet Take 1 tablet by mouth every 4 (four) hours as needed for severe pain.   tamsulosin 0.4 MG Caps capsule Commonly known as: FLOMAX Take 0.4 mg by mouth.   tamsulosin 0.4 MG  Caps capsule Commonly known as: FLOMAX TAKE 1 CAPSULE (0.4 MG TOTAL) BY MOUTH DAILY AFTER SUPPER.   ticagrelor 90 MG Tabs tablet Commonly known as: BRILINTA Take 1 tablet (90 mg total) by mouth 2 (two) times daily. (MAY STOP THE END OF June)   traMADol 50 MG tablet Commonly known as: ULTRAM Take 1 tablet (50 mg total) by mouth in the morning and at bedtime. For severe pain       Allergies: No Known Allergies  Family History: Family History  Problem Relation Age of Onset  . Heart disease Father        CABG age 66's    Social History:  reports that he quit smoking about 11  years ago. His smoking use included cigarettes. He quit after 30.00 years of use. His smokeless tobacco use includes chew. He reports current alcohol use. He reports previous drug use.  ROS: All other review of systems were reviewed and are negative except what is noted above in HPI  Physical Exam: BP (!) 150/89   Pulse 85   Temp 98.4 F (36.9 C)   Ht 5\' 8"  (1.727 m)   Wt 249 lb (112.9 kg)   BMI 37.86 kg/m   Constitutional:  Alert and oriented, No acute distress. HEENT: [place] AT, moist mucus membranes.  Trachea midline, no masses. Cardiovascular: No clubbing, cyanosis, or edema. Respiratory: Normal respiratory effort, no increased work of breathing. GI: Abdomen is soft, nontender, nondistended, no abdominal masses GU: No CVA tenderness.  Lymph: No cervical or inguinal lymphadenopathy. Skin: No rashes, bruises or suspicious lesions. Neurologic: Grossly intact, no focal deficits, moving all 4 extremities. Psychiatric: Normal mood and affect.  Laboratory Data: Lab Results  Component Value Date   WBC 6.7 07/14/2020   HGB 14.4 07/14/2020   HCT 42.5 07/14/2020   MCV 89.3 07/14/2020   PLT 223 07/14/2020    Lab Results  Component Value Date   CREATININE 1.61 (H) 07/14/2020    No results found for: PSA  No results found for: TESTOSTERONE  Lab Results  Component Value Date   HGBA1C 5.9 (H) 07/18/2019    Urinalysis    Component Value Date/Time   COLORURINE YELLOW 05/21/2008 0638   APPEARANCEUR Clear 08/05/2020 1631   LABSPEC >1.030 (H) 05/21/2008 0638   PHURINE 5.0 05/21/2008 0638   GLUCOSEU Negative 08/05/2020 1631   HGBUR LARGE (A) 05/21/2008 0638   BILIRUBINUR Negative 08/05/2020 1631   KETONESUR negative 06/23/2020 [place] 05/21/2008 0638   PROTEINUR Negative 08/05/2020 1631   PROTEINUR NEGATIVE 05/21/2008 0638   UROBILINOGEN 0.2 06/23/2020 0937   UROBILINOGEN 0.2 05/21/2008 0638   NITRITE Negative 08/05/2020 1631   NITRITE NEGATIVE 05/21/2008  0638   LEUKOCYTESUR Negative 08/05/2020 1631    Lab Results  Component Value Date   LABMICR Comment 08/05/2020   WBCUA None seen 07/20/2020   LABEPIT None seen 07/20/2020   MUCUS Present 07/20/2020   BACTERIA None seen 07/20/2020    Pertinent Imaging: KUB 9/23: Images reviewed and discussed with the patient Results for orders placed during the hospital encounter of 09/03/20  DG Abd 1 View  Narrative CLINICAL DATA:  Intermittent right flank pain. Recent lithotripsy 2 weeks ago.  EXAM: ABDOMEN - 1 VIEW  COMPARISON:  Abdominal x-ray dated August 05, 2020. CT abdomen pelvis dated June 29, 2020.  FINDINGS: No discrete ureteral calculi. Unchanged left renal calculi. Known right renal calculi are obscured by overlying stool. The visualized bowel gas pattern is normal. No  acute osseous abnormality.  IMPRESSION: 1. No definite residual right ureteral calculus. 2. Unchanged left nephrolithiasis. Known right nephrolithiasis is obscured by stool.   Electronically Signed By: Titus Dubin M.D. On: 09/03/2020 16:15  No results found for this or any previous visit.  No results found for this or any previous visit.  No results found for this or any previous visit.  No results found for this or any previous visit.  No results found for this or any previous visit.  No results found for this or any previous visit.  Results for orders placed in visit on 06/29/20  CT RENAL STONE STUDY  Narrative CLINICAL DATA:  Right-sided flank pain with hematuria  EXAM: CT ABDOMEN AND PELVIS WITHOUT CONTRAST  TECHNIQUE: Multidetector CT imaging of the abdomen and pelvis was performed following the standard protocol without IV contrast.  COMPARISON:  CT 05/21/2008  FINDINGS: Lower chest: Lung bases demonstrate no acute consolidation or effusion. Coronary vascular calcifications.  Hepatobiliary: No focal liver abnormality is seen. No gallstones, gallbladder wall thickening,  or biliary dilatation.  Pancreas: Unremarkable. No pancreatic ductal dilatation or surrounding inflammatory changes.  Spleen: Normal in size without focal abnormality.  Adrenals/Urinary Tract: Adrenal glands are normal. Multiple intrarenal stones bilaterally. Largest stone on the left is seen in the mid to lower pole and measures 11 mm. Largest stone on the right is seen within the midpole and measures up to 4 mm in size. No significant hydronephrosis or hydroureter. 6 mm stone in the proximal to mid right ureter at the inferior aspect of L4. The urinary bladder is slightly thick walled.  Stomach/Bowel: Stomach is within normal limits. Appendix appears normal. No evidence of bowel wall thickening, distention, or inflammatory changes. Diverticular disease of the left colon without acute inflammatory change.  Vascular/Lymphatic: Mild aortic atherosclerosis without aneurysm. No suspicious nodes.  Reproductive: Slightly enlarged prostate with coarse calcifications.  Other: Negative for free air or free fluid. Small fat containing umbilical hernia. Small fat containing inguinal hernias.  Musculoskeletal: No acute or significant osseous findings.  IMPRESSION: 1. 6 mm stone within the proximal to mid right ureter at approximate inferior L4 level, though without significant hydronephrosis or proximal hydroureter. 2. Numerous intrarenal stones bilaterally 3. Diverticular disease of the left colon without acute inflammatory change.  These results will be called to the ordering clinician or representative by the Radiology Department at the imaging location.   Electronically Signed By: Donavan Foil M.D. On: 06/29/2020 17:56   Assessment & Plan:    1. Kidney stones -continue medical expulsive therapy. RTC 3-4 weeks with renal US - Urinalysis, Routine w reflex microscopic   Return in about 3 weeks (around 10/01/2020) for renal US.  Nicolette Bang, MD  [facility]  [facility] [place]

## 2020-09-16 ENCOUNTER — Ambulatory Visit (HOSPITAL_COMMUNITY)
Admission: RE | Admit: 2020-09-16 | Discharge: 2020-09-16 | Disposition: A | Discharge status: Home / Self Care | Payer: BC Managed Care – PPO | Source: Ambulatory Visit | Attending: Urology | Admitting: Urology

## 2020-09-16 ENCOUNTER — Other Ambulatory Visit: Payer: Self-pay

## 2020-09-16 DIAGNOSIS — N200 Calculus of kidney: Secondary | ICD-10-CM | POA: Diagnosis not present

## 2020-09-29 NOTE — Progress Notes (Signed)
Results sent via my chart 

## 2020-10-01 ENCOUNTER — Encounter: Payer: Self-pay | Admitting: Urology

## 2020-10-01 ENCOUNTER — Ambulatory Visit (INDEPENDENT_AMBULATORY_CARE_PROVIDER_SITE_OTHER): Payer: BC Managed Care – PPO | Admitting: Urology

## 2020-10-01 ENCOUNTER — Other Ambulatory Visit: Payer: Self-pay

## 2020-10-01 VITALS — BP 143/83 | HR 88 | Temp 99.1°F | Ht 68.0 in | Wt 249.0 lb

## 2020-10-01 DIAGNOSIS — N200 Calculus of kidney: Secondary | ICD-10-CM | POA: Diagnosis not present

## 2020-10-01 LAB — URINALYSIS, ROUTINE W REFLEX MICROSCOPIC
Bilirubin, UA: NEGATIVE
Glucose, UA: NEGATIVE
Leukocytes,UA: NEGATIVE
Nitrite, UA: NEGATIVE
RBC, UA: NEGATIVE
Specific Gravity, UA: 1.025 (ref 1.005–1.030)
Urobilinogen, Ur: 0.2 mg/dL (ref 0.2–1.0)
pH, UA: 5.5 (ref 5.0–7.5)

## 2020-10-01 NOTE — Progress Notes (Signed)
Cardiology Office Note  Date: 10/02/2020   ID: Jeremy Riley, DOB 12/26/1960, MRN [id number]  PCP:  Kathyrn Drown, MD  Cardiologist:  Rozann Lesches, MD Electrophysiologist:  None   Chief Complaint  Patient presents with  . Cardiac follow-up    History of Present Illness: Jeremy Riley is a 59 y.o. male last seen in June by Ms. Strader PA-C.  He presents for a routine visit.  States that he is doing well, no angina symptoms or major functional limitations.  He plans to retire this coming June from working in the [employer].  I reviewed his medications which are listed below.  He reports no intolerances.  He had lab work done recently as well, LDL 56.  Blood pressure is better controlled today.   Past Medical History:  Diagnosis Date  . Coronary artery disease    a. LHC 05/22/2019: 99% dCx s/p DES, luminal irregularities in LAD and dominant RCA  . Essential hypertension   . History of pneumonia as a child   . Hyperlipidemia   . Lumbar disc disease    Fall from ladder  . Lumbar pain 05/18/2020   Patient with known history of sciatica.  On multiple medicines for his heart cannot take anti-inflammatories, intermittent use of tramadol prescribed  . Nephrolithiasis   . Pre-diabetes    Hgb A1c 6.3 in 05/2019.    Past Surgical History:  Procedure Laterality Date  . CORONARY STENT INTERVENTION N/A 05/22/2019   Procedure: CORONARY STENT INTERVENTION;  Surgeon: Belva Crome, MD;  Location: [facility];  Service: Cardiovascular;  Laterality: N/A;  . EXTRACORPOREAL SHOCK WAVE LITHOTRIPSY Right 07/21/2020   Procedure: EXTRACORPOREAL SHOCK WAVE LITHOTRIPSY (ESWL);  Surgeon: Cleon Gustin, MD;  Location: [facility];  Service: Urology;  Laterality: Right;  . LEFT HEART CATH AND CORONARY ANGIOGRAPHY N/A 05/22/2019   Procedure: LEFT HEART CATH AND CORONARY ANGIOGRAPHY;  Surgeon: Belva Crome, MD;  Location: [facility];  Service: Cardiovascular;  Laterality: N/A;      Current Outpatient Medications  Medication Sig Dispense Refill  . acetaminophen (TYLENOL) 500 MG tablet Take 500 mg by mouth every 6 (six) hours as needed for mild pain or fever.     Marland Kitchen aspirin 81 MG chewable tablet Chew 1 tablet (81 mg total) by mouth daily.    Marland Kitchen atorvastatin (LIPITOR) 80 MG tablet Take 1 tablet (80 mg total) by mouth daily at 6 PM. 90 tablet 3  . carvedilol (COREG) 3.125 MG tablet Take 1 tablet (3.125 mg total) by mouth 2 (two) times daily with a meal. 180 tablet 3  . HYDROcodone-acetaminophen (NORCO/VICODIN) 5-325 MG tablet Take 1 tablet by mouth every 6 (six) hours as needed for moderate pain. 30 tablet 0  . losartan-hydrochlorothiazide (HYZAAR) 100-12.5 MG tablet Take 1 tablet by mouth daily. 90 tablet 3  . nitroGLYCERIN (NITROSTAT) 0.4 MG SL tablet Place 1 tablet (0.4 mg total) under the tongue every 5 (five) minutes x 3 doses as needed for chest pain. 25 tablet 3  . ondansetron (ZOFRAN) 4 MG tablet Take 1 tablet (4 mg total) by mouth every 8 (eight) hours as needed for nausea or vomiting. 20 tablet 0  . tamsulosin (FLOMAX) 0.4 MG CAPS capsule TAKE 1 CAPSULE (0.4 MG TOTAL) BY MOUTH DAILY AFTER SUPPER. 90 capsule 1  . traMADol (ULTRAM) 50 MG tablet Take 1 tablet (50 mg total) by mouth in the morning and at bedtime. For severe pain 5 tablet  0  . ondansetron (ZOFRAN) 4 MG tablet Take 1 tablet (4 mg total) by mouth daily as needed for nausea or vomiting. 30 tablet 1  . tamsulosin (FLOMAX) 0.4 MG CAPS capsule Take 0.4 mg by mouth.     No current facility-administered medications for this visit.   Allergies:  Patient has no known allergies.   ROS: No palpitations or syncope.  Physical Exam: VS:  BP 118/84   Pulse 80   Ht 5\' 8"  (1.727 m)   Wt 246 lb 6.4 oz (111.8 kg)   SpO2 98%   BMI 37.46 kg/m , BMI Body mass index is 37.46 kg/m.  Wt Readings from Last 3 Encounters:  10/02/20 246 lb 6.4 oz (111.8 kg)  10/01/20 249 lb (112.9 kg)  09/10/20 249 lb (112.9 kg)     General: Patient appears comfortable at rest. HEENT: Conjunctiva and lids normal, wearing a mask. Neck: Supple, no elevated JVP or carotid bruits, no thyromegaly. Lungs: Clear to auscultation, nonlabored breathing at rest. Cardiac: Regular rate and rhythm, no S3 or significant systolic murmur, no pericardial rub. Extremities: No pitting edema, distal pulses 2+.  ECG:  An ECG dated 05/08/2020 was personally reviewed today and demonstrated:  Normal sinus rhythm.  Recent Labwork: 05/21/2020: ALT 30; AST 26 07/14/2020: BUN 22; Creatinine, Ser 1.61; Hemoglobin 14.4; Platelets 223; Potassium 3.8; Sodium 140     Component Value Date/Time   CHOL 114 05/21/2020 0819   TRIG 106 05/21/2020 0819   HDL 38 (L) 05/21/2020 0819   CHOLHDL 3.0 05/21/2020 0819   CHOLHDL 3.2 07/18/2019 0839   VLDL 22 07/18/2019 0839   LDLCALC 56 05/21/2020 0819    Other Studies Reviewed Today:  Cardiac catheterization 05/22/2019:  Unstable angina pectoris  Successful DES implantation in distal circumflex reducing a culprit 99% stenosis to 0% using a 2.5 x 18 Onyx postdilated to 2.75 mm. TIMI grade III flow noted.  Normal left main.  Luminal irregularities in LAD  Luminal irregularities and dominant right coronary  RECOMMENDATIONS:   Aggressive secondary risk factor modification: Consider the diagnosis of sleep apnea; hemoglobin A1c should be less than 7; LDL cholesterol less than 70 and preferably 55 or less; blood pressure 130/80 mmHg or less; moderate aerobic activity; discontinue vaping.  Eligible for discharge in a.m. if no problems.  Assessment and Plan:  1.  CAD status post DES to the distal circumflex in June 2020.  He continues to do well, no recurring angina symptoms on medical therapy.  Continue observation on aspirin, Coreg, Lipitor, Hyzaar, and as needed nitroglycerin.  2.  Mixed hyperlipidemia, tolerating Lipitor with recent LDL 56.  3.  Essential hypertension, blood pressure is well  controlled today on Hyzaar and Coreg.  No changes were made.  Medication Adjustments/Labs and Tests Ordered: Current medicines are reviewed at length with the patient today.  Concerns regarding medicines are outlined above.   Tests Ordered: No orders of the defined types were placed in this encounter.   Medication Changes: No orders of the defined types were placed in this encounter.   Disposition:  Follow up 6 months in the [place] office.  Signed, Satira Sark, MD, [facility] 10/02/2020 9:14 AM    [facility] [place]. [address], [place], [place] [address] Phone: [phone]; Fax: [phone]

## 2020-10-01 NOTE — Progress Notes (Signed)
Urological Symptom Review  Patient is experiencing the following symptoms: Kidney stones   Review of Systems  Gastrointestinal (upper)  : Negative for upper GI symptoms  Gastrointestinal (lower) : Negative for lower GI symptoms  Constitutional : Negative for symptoms  Skin: Negative for skin symptoms  Eyes: Negative for eye symptoms  Ear/Nose/Throat : Negative for Ear/Nose/Throat symptoms  Hematologic/Lymphatic: Negative for Hematologic/Lymphatic symptoms  Cardiovascular : Negative for cardiovascular symptoms  Respiratory : Negative for respiratory symptoms  Endocrine: Negative for endocrine symptoms  Musculoskeletal: Negative for musculoskeletal symptoms  Neurological: Negative for neurological symptoms  Psychologic: Negative for psychiatric symptoms

## 2020-10-01 NOTE — Patient Instructions (Signed)
Dietary Guidelines to Help Prevent Kidney Stones Kidney stones are deposits of minerals and salts that form inside your kidneys. Your risk of developing kidney stones may be greater depending on your diet, your lifestyle, the medicines you take, and whether you have certain medical conditions. Most people can reduce their chances of developing kidney stones by following the instructions below. Depending on your overall health and the type of kidney stones you tend to develop, your dietitian may give you more specific instructions. What are tips for following this plan? Reading food labels  Choose foods with "no salt added" or "low-salt" labels. Limit your sodium intake to less than 1500 mg per day.  Choose foods with calcium for each meal and snack. Try to eat about 300 mg of calcium at each meal. Foods that contain 200-500 mg of calcium per serving include: ? 8 oz (237 ml) of milk, fortified nondairy milk, and fortified fruit juice. ? 8 oz (237 ml) of kefir, yogurt, and soy yogurt. ? 4 oz (118 ml) of tofu. ? 1 oz of cheese. ? 1 cup (300 g) of dried figs. ? 1 cup (91 g) of cooked broccoli. ? 1-3 oz can of sardines or mackerel.  Most people need 1000 to 1500 mg of calcium each day. Talk to your dietitian about how much calcium is recommended for you. Shopping  Buy plenty of fresh fruits and vegetables. Most people do not need to avoid fruits and vegetables, even if they contain nutrients that may contribute to kidney stones.  When shopping for convenience foods, choose: ? Whole pieces of fruit. ? Premade salads with dressing on the side. ? Low-fat fruit and yogurt smoothies.  Avoid buying frozen meals or prepared deli foods.  Look for foods with live cultures, such as yogurt and kefir. Cooking  Do not add salt to food when cooking. Place a salt shaker on the table and allow each person to add his or her own salt to taste.  Use vegetable protein, such as beans, textured vegetable  protein (TVP), or tofu instead of meat in pasta, casseroles, and soups. Meal planning   Eat less salt, if told by your dietitian. To do this: ? Avoid eating processed or premade food. ? Avoid eating fast food.  Eat less animal protein, including cheese, meat, poultry, or fish, if told by your dietitian. To do this: ? Limit the number of times you have meat, poultry, fish, or cheese each week. Eat a diet free of meat at least 2 days a week. ? Eat only one serving each day of meat, poultry, fish, or seafood. ? When you prepare animal protein, cut pieces into small portion sizes. For most meat and fish, one serving is about the size of one deck of cards.  Eat at least 5 servings of fresh fruits and vegetables each day. To do this: ? Keep fruits and vegetables on hand for snacks. ? Eat 1 piece of fruit or a handful of berries with breakfast. ? Have a salad and fruit at lunch. ? Have two kinds of vegetables at dinner.  Limit foods that are high in a substance called oxalate. These include: ? Spinach. ? Rhubarb. ? Beets. ? Potato chips and french fries. ? Nuts.  If you regularly take a diuretic medicine, make sure to eat at least 1-2 fruits or vegetables high in potassium each day. These include: ? Avocado. ? Banana. ? Orange, prune, carrot, or tomato juice. ? Baked potato. ? Cabbage. ? Beans and split   peas. General instructions   Drink enough fluid to keep your urine clear or pale yellow. This is the most important thing you can do.  Talk to your health care provider and dietitian about taking daily supplements. Depending on your health and the cause of your kidney stones, you may be advised: ? Not to take supplements with vitamin C. ? To take a calcium supplement. ? To take a daily probiotic supplement. ? To take other supplements such as magnesium, fish oil, or vitamin B6.  Take all medicines and supplements as told by your health care provider.  Limit alcohol intake to no  more than 1 drink a day for nonpregnant women and 2 drinks a day for men. One drink equals 12 oz of beer, 5 oz of wine, or 1 oz of hard liquor.  Lose weight if told by your health care provider. Work with your dietitian to find strategies and an eating plan that works best for you. What foods are not recommended? Limit your intake of the following foods, or as told by your dietitian. Talk to your dietitian about specific foods you should avoid based on the type of kidney stones and your overall health. Grains Breads. Bagels. Rolls. Baked goods. Salted crackers. Cereal. Pasta. Vegetables Spinach. Rhubarb. Beets. Canned vegetables. Pickles. Olives. Meats and other protein foods Nuts. Nut butters. Large portions of meat, poultry, or fish. Salted or cured meats. Deli meats. Hot dogs. Sausages. Dairy Cheese. Beverages Regular soft drinks. Regular vegetable juice. Seasonings and other foods Seasoning blends with salt. Salad dressings. Canned soups. Soy sauce. Ketchup. Barbecue sauce. Canned pasta sauce. Casseroles. Pizza. Lasagna. Frozen meals. Potato chips. French fries. Summary  You can reduce your risk of kidney stones by making changes to your diet.  The most important thing you can do is drink enough fluid. You should drink enough fluid to keep your urine clear or pale yellow.  Ask your health care provider or dietitian how much protein from animal sources you should eat each day, and also how much salt and calcium you should have each day. This information is not intended to replace advice given to you by your health care provider. Make sure you discuss any questions you have with your health care provider. Document Revised: 03/20/2019 Document Reviewed: 11/08/2016 Else[employer] Patient Education  2020 [employer]  

## 2020-10-01 NOTE — Progress Notes (Signed)
10/01/2020 10:55 AM   Jeremy Riley [birth year] [id number]  Referring provider: Kathyrn Drown, MD [address] [place],  [place] [address]  nephrolithiasis  HPI: [name] is a 59yo here for followup for nephrolithiasis. No flank pain. No stone events. Renal US shows 85mm right renal calculus and an 88mm left renal calculus. No hydronephrosis. No LUTS.   PMH: Past Medical History:  Diagnosis Date  . Coronary artery disease    a. LHC 05/22/2019: 99% dCx s/p DES, luminal irregularities in LAD and dominant RCA  . History of pneumonia as a child   . Hyperlipidemia   . Hypertension   . Kidney stone   . Lumbar disc disease    Fall from ladder  . Lumbar pain 05/18/2020   Patient with known history of sciatica.  On multiple medicines for his heart cannot take anti-inflammatories, intermittent use of tramadol prescribed  . Pre-diabetes    Hgb A1c 6.3 in 05/2019.  Marland Kitchen Renal disorder    kidney stones    Surgical History: Past Surgical History:  Procedure Laterality Date  . CORONARY STENT INTERVENTION N/A 05/22/2019   Procedure: CORONARY STENT INTERVENTION;  Surgeon: Belva Crome, MD;  Location: [facility];  Service: Cardiovascular;  Laterality: N/A;  . EXTRACORPOREAL SHOCK WAVE LITHOTRIPSY Right 07/21/2020   Procedure: EXTRACORPOREAL SHOCK WAVE LITHOTRIPSY (ESWL);  Surgeon: Cleon Gustin, MD;  [facility];  Service: Urology;  Laterality: Right;  . LEFT HEART CATH AND CORONARY ANGIOGRAPHY N/A 05/22/2019   Procedure: LEFT HEART CATH AND CORONARY ANGIOGRAPHY;  Surgeon: Belva Crome, MD;  Location: [facility];  Service: Cardiovascular;  Laterality: N/A;    Home Medications:  Allergies as of 10/01/2020   No Known Allergies     Medication List       Accurate as of October 01, 2020 10:55 AM. If you have any questions, ask your nurse or doctor.        acetaminophen 500 MG tablet Commonly known as: TYLENOL Take 500 mg by mouth every 6 (six)  hours as needed for mild pain or fever.   aspirin 81 MG chewable tablet Chew 1 tablet (81 mg total) by mouth daily.   atorvastatin 80 MG tablet Commonly known as: LIPITOR Take 1 tablet (80 mg total) by mouth daily at 6 PM.   carvedilol 3.125 MG tablet Commonly known as: COREG Take 1 tablet (3.125 mg total) by mouth 2 (two) times daily with a meal.   HYDROcodone-acetaminophen 5-325 MG tablet Commonly known as: NORCO/VICODIN Take 1 tablet by mouth every 6 (six) hours as needed for moderate pain.   losartan-hydrochlorothiazide 100-12.5 MG tablet Commonly known as: HYZAAR Take 1 tablet by mouth daily.   nitroGLYCERIN 0.4 MG SL tablet Commonly known as: NITROSTAT Place 1 tablet (0.4 mg total) under the tongue every 5 (five) minutes x 3 doses as needed for chest pain.   ondansetron 4 MG tablet Commonly known as: Zofran Take 1 tablet (4 mg total) by mouth every 8 (eight) hours as needed for nausea or vomiting.   ondansetron 4 MG tablet Commonly known as: Zofran Take 1 tablet (4 mg total) by mouth daily as needed for nausea or vomiting.   oxyCODONE-acetaminophen 5-325 MG tablet Commonly known as: Percocet Take 1 tablet by mouth every 4 (four) hours as needed for severe pain.   tamsulosin 0.4 MG Caps capsule Commonly known as: FLOMAX Take 0.4 mg by mouth.   tamsulosin 0.4 MG Caps capsule  Commonly known as: FLOMAX TAKE 1 CAPSULE (0.4 MG TOTAL) BY MOUTH DAILY AFTER SUPPER.   ticagrelor 90 MG Tabs tablet Commonly known as: BRILINTA Take 1 tablet (90 mg total) by mouth 2 (two) times daily. (MAY STOP THE END OF June)   traMADol 50 MG tablet Commonly known as: ULTRAM Take 1 tablet (50 mg total) by mouth in the morning and at bedtime. For severe pain       Allergies: No Known Allergies  Family History: Family History  Problem Relation Age of Onset  . Heart disease Father        CABG age 22's    Social History:  reports that he quit smoking about 11 years ago. His  smoking use included cigarettes. He quit after 30.00 years of use. His smokeless tobacco use includes chew. He reports current alcohol use. He reports previous drug use.  ROS: All other review of systems were reviewed and are negative except what is noted above in HPI  Physical Exam: BP (!) 143/83   Pulse 88   Temp 99.1 F (37.3 C)   Ht 5\' 8"  (1.727 m)   Wt 249 lb (112.9 kg)   BMI 37.86 kg/m   Constitutional:  Alert and oriented, No acute distress. HEENT: [place] AT, moist mucus membranes.  Trachea midline, no masses. Cardiovascular: No clubbing, cyanosis, or edema. Respiratory: Normal respiratory effort, no increased work of breathing. GI: Abdomen is soft, nontender, nondistended, no abdominal masses GU: No CVA tenderness.  Lymph: No cervical or inguinal lymphadenopathy. Skin: No rashes, bruises or suspicious lesions. Neurologic: Grossly intact, no focal deficits, moving all 4 extremities. Psychiatric: Normal mood and affect.  Laboratory Data: Lab Results  Component Value Date   WBC 6.7 07/14/2020   HGB 14.4 07/14/2020   HCT 42.5 07/14/2020   MCV 89.3 07/14/2020   PLT 223 07/14/2020    Lab Results  Component Value Date   CREATININE 1.61 (H) 07/14/2020    No results found for: PSA  No results found for: TESTOSTERONE  Lab Results  Component Value Date   HGBA1C 5.9 (H) 07/18/2019    Urinalysis    Component Value Date/Time   COLORURINE YELLOW 05/21/2008 0638   APPEARANCEUR Clear 09/10/2020 1051   LABSPEC >1.030 (H) 05/21/2008 0638   PHURINE 5.0 05/21/2008 0638   GLUCOSEU Negative 09/10/2020 1051   HGBUR LARGE (A) 05/21/2008 0638   BILIRUBINUR Negative 09/10/2020 1051   KETONESUR negative 06/23/2020 [place] 05/21/2008 0638   PROTEINUR Negative 09/10/2020 1051   PROTEINUR NEGATIVE 05/21/2008 0638   UROBILINOGEN 0.2 06/23/2020 0937   UROBILINOGEN 0.2 05/21/2008 0638   NITRITE Negative 09/10/2020 1051   NITRITE NEGATIVE 05/21/2008 0638    LEUKOCYTESUR Negative 09/10/2020 1051    Lab Results  Component Value Date   LABMICR Comment 09/10/2020   WBCUA None seen 07/20/2020   LABEPIT None seen 07/20/2020   MUCUS Present 07/20/2020   BACTERIA None seen 07/20/2020    Pertinent Imaging: Renal US 09/16/2020: Images reviewed and discussed witht he patinet Results for orders placed during the hospital encounter of 09/03/20  DG Abd 1 View  Narrative CLINICAL DATA:  Intermittent right flank pain. Recent lithotripsy 2 weeks ago.  EXAM: ABDOMEN - 1 VIEW  COMPARISON:  Abdominal x-ray dated August 05, 2020. CT abdomen pelvis dated June 29, 2020.  FINDINGS: No discrete ureteral calculi. Unchanged left renal calculi. Known right renal calculi are obscured by overlying stool. The visualized bowel gas pattern is normal. No acute  osseous abnormality.  IMPRESSION: 1. No definite residual right ureteral calculus. 2. Unchanged left nephrolithiasis. Known right nephrolithiasis is obscured by stool.   Electronically Signed By: Titus Dubin M.D. On: 09/03/2020 16:15  No results found for this or any previous visit.  No results found for this or any previous visit.  No results found for this or any previous visit.  Results for orders placed during the hospital encounter of 09/16/20  Ultrasound renal complete  Narrative CLINICAL DATA:  Initial evaluation for nephrolithiasis. History of recent lithotripsy on 07/21/2020. Small  EXAM: RENAL / URINARY TRACT ULTRASOUND COMPLETE  COMPARISON:  Prior CT from 06/29/2020.  FINDINGS: Right Kidney:  Renal measurements: 13.4 x 5.5 x 6.3 cm = volume: 240.6 mL. Renal echogenicity within normal limits. 4 mm nonobstructive calculus present at the interpolar region. No other definite calculi visible by ultrasound. No hydronephrosis. No focal renal mass.  Left Kidney:  Renal measurements: 12.5 x 6.2 x 6.8 cm = volume: 279.2 mL. Renal echogenicity within normal limits. 11 mm  nonobstructive calculus present at the mid-lower pole. Few additional probable scattered nonobstructive calculi suspected as well. No hydronephrosis. No focal renal mass.  Bladder:  Appears normal for degree of bladder distention.  Other:  None.  IMPRESSION: Bilateral nonobstructive nephrolithiasis measuring up to 4 mm on the right and 11 mm on the left. No hydronephrosis or obstructive uropathy.   Electronically Signed By: Jeannine Boga M.D. On: 09/17/2020 00:02  No results found for this or any previous visit.  No results found for this or any previous visit.  Results for orders placed in visit on 06/29/20  CT RENAL STONE STUDY  Narrative CLINICAL DATA:  Right-sided flank pain with hematuria  EXAM: CT ABDOMEN AND PELVIS WITHOUT CONTRAST  TECHNIQUE: Multidetector CT imaging of the abdomen and pelvis was performed following the standard protocol without IV contrast.  COMPARISON:  CT 05/21/2008  FINDINGS: Lower chest: Lung bases demonstrate no acute consolidation or effusion. Coronary vascular calcifications.  Hepatobiliary: No focal liver abnormality is seen. No gallstones, gallbladder wall thickening, or biliary dilatation.  Pancreas: Unremarkable. No pancreatic ductal dilatation or surrounding inflammatory changes.  Spleen: Normal in size without focal abnormality.  Adrenals/Urinary Tract: Adrenal glands are normal. Multiple intrarenal stones bilaterally. Largest stone on the left is seen in the mid to lower pole and measures 11 mm. Largest stone on the right is seen within the midpole and measures up to 4 mm in size. No significant hydronephrosis or hydroureter. 6 mm stone in the proximal to mid right ureter at the inferior aspect of L4. The urinary bladder is slightly thick walled.  Stomach/Bowel: Stomach is within normal limits. Appendix appears normal. No evidence of bowel wall thickening, distention, or inflammatory changes. Diverticular  disease of the left colon without acute inflammatory change.  Vascular/Lymphatic: Mild aortic atherosclerosis without aneurysm. No suspicious nodes.  Reproductive: Slightly enlarged prostate with coarse calcifications.  Other: Negative for free air or free fluid. Small fat containing umbilical hernia. Small fat containing inguinal hernias.  Musculoskeletal: No acute or significant osseous findings.  IMPRESSION: 1. 6 mm stone within the proximal to mid right ureter at approximate inferior L4 level, though without significant hydronephrosis or proximal hydroureter. 2. Numerous intrarenal stones bilaterally 3. Diverticular disease of the left colon without acute inflammatory change.  These results will be called to the ordering clinician or representative by the Radiology Department at the imaging location.   Electronically Signed By: Donavan Foil M.D. On: 06/29/2020 17:56  Assessment & Plan:    1. Nephrolithiasis -RTC 6 months with KUB - Urinalysis, Routine w reflex microscopic   No follow-ups on file.  Nicolette Bang, MD  [facility] [place]

## 2020-10-02 ENCOUNTER — Encounter: Payer: Self-pay | Admitting: Cardiology

## 2020-10-02 ENCOUNTER — Ambulatory Visit: Payer: BC Managed Care – PPO | Admitting: Cardiology

## 2020-10-02 VITALS — BP 118/84 | HR 80 | Ht 68.0 in | Wt 246.4 lb

## 2020-10-02 DIAGNOSIS — I25119 Atherosclerotic heart disease of native coronary artery with unspecified angina pectoris: Secondary | ICD-10-CM

## 2020-10-02 DIAGNOSIS — E782 Mixed hyperlipidemia: Secondary | ICD-10-CM

## 2020-10-02 DIAGNOSIS — I10 Essential (primary) hypertension: Secondary | ICD-10-CM | POA: Diagnosis not present

## 2020-10-02 NOTE — Patient Instructions (Signed)
Medication Instructions:  Your physician recommends that you continue on your current medications as directed. Please refer to the Current Medication list given to you today.  *If you need a refill on your cardiac medications before your next appointment, please call your pharmacy*  Lab Work: None today If you have labs (blood work) drawn today and your tests are completely normal, you will receive your results only by: . MyChart Message (if you have MyChart) OR . A paper copy in the mail If you have any lab test that is abnormal or we need to change your treatment, we will call you to review the results.  Testing/Procedures: None today  Follow-Up: At [facility], you and your health needs are our priority.  As part of our continuing mission to provide you with exceptional heart care, we have created designated Provider Care Teams.  These Care Teams include your primary Cardiologist (physician) and Advanced Practice Providers (APPs -  Physician Assistants and Nurse Practitioners) who all work together to provide you with the care you need, when you need it.  Your next appointment:   6 months  The format for your next appointment:   In Person  Provider:   Samuel McDowell, MD  Other Instructions None     Thank you for choosing [facility] !         

## 2020-10-13 ENCOUNTER — Ambulatory Visit: Payer: BC Managed Care – PPO | Admitting: Cardiology

## 2020-12-14 ENCOUNTER — Encounter (HOSPITAL_COMMUNITY): Payer: Self-pay | Admitting: Emergency Medicine

## 2020-12-14 ENCOUNTER — Other Ambulatory Visit: Payer: Self-pay

## 2020-12-14 ENCOUNTER — Emergency Department (HOSPITAL_COMMUNITY)
Admission: EM | Admit: 2020-12-14 | Discharge: 2020-12-14 | Disposition: A | Discharge status: Home / Self Care | Payer: BC Managed Care – PPO | Attending: Emergency Medicine | Admitting: Emergency Medicine

## 2020-12-14 ENCOUNTER — Emergency Department (HOSPITAL_COMMUNITY): Payer: BC Managed Care – PPO

## 2020-12-14 DIAGNOSIS — Z5321 Procedure and treatment not carried out due to patient leaving prior to being seen by health care provider: Secondary | ICD-10-CM | POA: Insufficient documentation

## 2020-12-14 DIAGNOSIS — R0789 Other chest pain: Secondary | ICD-10-CM | POA: Insufficient documentation

## 2020-12-14 DIAGNOSIS — R079 Chest pain, unspecified: Secondary | ICD-10-CM | POA: Diagnosis not present

## 2020-12-14 DIAGNOSIS — R61 Generalized hyperhidrosis: Secondary | ICD-10-CM | POA: Insufficient documentation

## 2020-12-14 NOTE — ED Triage Notes (Signed)
Patient states he has had chest tightness since last night but it got worse while he as at work, central chest tightness and he started sweating. Denies N/V/D, tingling or numbness.

## 2020-12-14 NOTE — ED Notes (Signed)
Called for blood draw x1 with no answer. 

## 2020-12-17 ENCOUNTER — Encounter: Payer: Self-pay | Admitting: Family Medicine

## 2020-12-17 ENCOUNTER — Other Ambulatory Visit: Payer: Self-pay

## 2020-12-17 ENCOUNTER — Ambulatory Visit (INDEPENDENT_AMBULATORY_CARE_PROVIDER_SITE_OTHER): Payer: BC Managed Care – PPO | Admitting: Family Medicine

## 2020-12-17 VITALS — BP 140/92 | HR 87 | Temp 98.4°F | Resp 16

## 2020-12-17 DIAGNOSIS — H66002 Acute suppurative otitis media without spontaneous rupture of ear drum, left ear: Secondary | ICD-10-CM | POA: Insufficient documentation

## 2020-12-17 DIAGNOSIS — R0600 Dyspnea, unspecified: Secondary | ICD-10-CM | POA: Insufficient documentation

## 2020-12-17 MED ORDER — ALBUTEROL SULFATE HFA 108 (90 BASE) MCG/ACT IN AERS: 2.0000 | INHALATION_SPRAY | Freq: Four times a day (QID) | RESPIRATORY_TRACT | 0 refills | Status: DC | PRN

## 2020-12-17 MED ORDER — AMOXICILLIN-POT CLAVULANATE 875-125 MG PO TABS: 1.0000 | ORAL_TABLET | Freq: Two times a day (BID) | ORAL | 0 refills | Status: DC

## 2020-12-17 NOTE — Progress Notes (Signed)
Patient ID: Jeremy Riley, male    DOB: 1961/03/30, 60 y.o.   MRN: [id number]   Chief Complaint  Patient presents with  . Nasal Congestion    Patient reports feeling congestion in his chest, coughing and "not feeling right" since breaking out in a sweat Monday at work and going to ER. Normal EKG at ER and no room available so patient left.    Subjective:  CC: congestion in chest  HPI: This is a new problem.  Presents today for an acute visit with complaint of "congestion in chest.  On January 3 he was working at [employer] when he broke out in a sweat and was experiencing congestion.  He went to the emergency department where they did an EKG which was normal did a chest x-ray which was normal and checked his blood pressure which was elevated at that time.  He denies fever, and chills.  He does report shortness of breath describes as "work to get my breath "has tried NyQuil DM which he has stopped due to his blood pressure, took DayQuil today which has helped.  He does have a cardiac history, his cardiologist is Dr. Diona Browner.  He has received all of his COVID vaccines, including booster dose.  Reports that the pain in his chest moves around to different locations.   Medical History [place] has a past medical history of Coronary artery disease, Essential hypertension, History of pneumonia as a child, Hyperlipidemia, Lumbar disc disease, Lumbar pain (05/18/2020), Nephrolithiasis, and Pre-diabetes.   Outpatient Encounter Medications as of 12/17/2020  Medication Sig  . albuterol (VENTOLIN HFA) 108 (90 Base) MCG/ACT inhaler Inhale 2 puffs into the lungs every 6 (six) hours as needed for wheezing or shortness of breath.  Marland Kitchen amoxicillin-clavulanate (AUGMENTIN) 875-125 MG tablet Take 1 tablet by mouth 2 (two) times daily.  Marland Kitchen acetaminophen (TYLENOL) 500 MG tablet Take 500 mg by mouth every 6 (six) hours as needed for mild pain or fever.   Marland Kitchen aspirin 81 MG chewable tablet Chew 1 tablet (81 mg total) by  mouth daily.  Marland Kitchen atorvastatin (LIPITOR) 80 MG tablet Take 1 tablet (80 mg total) by mouth daily at 6 PM.  . carvedilol (COREG) 3.125 MG tablet Take 1 tablet (3.125 mg total) by mouth 2 (two) times daily with a meal.  . HYDROcodone-acetaminophen (NORCO/VICODIN) 5-325 MG tablet Take 1 tablet by mouth every 6 (six) hours as needed for moderate pain. (Patient not taking: Reported on 12/17/2020)  . losartan-hydrochlorothiazide (HYZAAR) 100-12.5 MG tablet Take 1 tablet by mouth daily.  . nitroGLYCERIN (NITROSTAT) 0.4 MG SL tablet Place 1 tablet (0.4 mg total) under the tongue every 5 (five) minutes x 3 doses as needed for chest pain.  . tamsulosin (FLOMAX) 0.4 MG CAPS capsule TAKE 1 CAPSULE (0.4 MG TOTAL) BY MOUTH DAILY AFTER SUPPER.  . traMADol (ULTRAM) 50 MG tablet Take 1 tablet (50 mg total) by mouth in the morning and at bedtime. For severe pain (Patient not taking: Reported on 12/17/2020)  . [DISCONTINUED] ondansetron (ZOFRAN) 4 MG tablet Take 1 tablet (4 mg total) by mouth every 8 (eight) hours as needed for nausea or vomiting.  . [DISCONTINUED] ondansetron (ZOFRAN) 4 MG tablet Take 1 tablet (4 mg total) by mouth daily as needed for nausea or vomiting.  . [DISCONTINUED] tamsulosin (FLOMAX) 0.4 MG CAPS capsule Take 0.4 mg by mouth.   No facility-administered encounter medications on file as of 12/17/2020.     Review of Systems  Constitutional: Negative for  chills, fatigue and fever.  HENT: Positive for congestion and sneezing. Negative for ear pain, sinus pressure, sinus pain and sore throat.   Respiratory: Positive for cough and shortness of breath. Negative for wheezing.        Work to get my breath  Gastrointestinal: Negative for abdominal pain, diarrhea, nausea and vomiting.  Neurological: Negative for dizziness, weakness, light-headedness and headaches.  Hematological: Negative for adenopathy.     Vitals BP (!) 140/92   Pulse 87   Temp 98.4 F (36.9 C)   Resp 16   SpO2 98%    Objective:   Physical Exam Vitals reviewed.  Constitutional:      General: He is not in acute distress. HENT:     Right Ear: Tympanic membrane normal.     Left Ear: No tenderness. No mastoid tenderness. Tympanic membrane is erythematous.     Nose: Nose normal.     Right Sinus: No maxillary sinus tenderness or frontal sinus tenderness.     Left Sinus: No maxillary sinus tenderness or frontal sinus tenderness.     Mouth/Throat:     Mouth: Mucous membranes are moist.     Pharynx: Uvula midline.     Tonsils: No tonsillar exudate. 1+ on the right. 2+ on the left.  Cardiovascular:     Rate and Rhythm: Normal rate and regular rhythm.     Heart sounds: Normal heart sounds.  Pulmonary:     Effort: Pulmonary effort is normal.     Breath sounds: Normal breath sounds.     Comments: No shortness of breath noted in office today. Skin:    General: Skin is warm and dry.  Neurological:     General: No focal deficit present.     Mental Status: He is alert.  Psychiatric:        Behavior: Behavior normal.      Assessment and Plan   1. Non-recurrent acute suppurative otitis media of left ear without spontaneous rupture of tympanic membrane - amoxicillin-clavulanate (AUGMENTIN) 875-125 MG tablet; Take 1 tablet by mouth 2 (two) times daily.  Dispense: 20 tablet; Refill: 0  2. Dyspnea, unspecified type - albuterol (VENTOLIN HFA) 108 (90 Base) MCG/ACT inhaler; Inhale 2 puffs into the lungs every 6 (six) hours as needed for wheezing or shortness of breath.  Dispense: 8 g; Refill: 0    Seen in ED on 12/14/20 for chest pain: EKG and Chest x-ray normal. Reviewed EKG from 1/3 ED visit.  Blood pressure elevated at that time: 151/94.  Has history of angina and stent, this does not feel like angina.  Sees Dr. Diona Browner, cardiology.   Otitis media: will treat with antibiotic for 10 days.  Recommend supportive care: hydration, humidification, symptom relief.   Dyspnea: described as "work to get  breath" will give albuterol inhaler as needed.  He is instructed to let us know if this gets worse, or if the inhaler does not relieve his symptoms.  Agrees with plan of care discussed today. Understands warning signs to seek further care: chest pain, shortness of breath, any significant change in health.  Understands to follow-up if symptoms do not improve or worsen. Will notify Dr. Diona Browner of elevated blood pressure, if continues to be > 130/80. He will also notify cardiology of visit to ED. Blood pressure improved today from ED visit, not optimal. Reports at home it is usually normal. He will monitor BP at home for next few days to see.  Again, he is encouraged to notify Dr.  Diona Browner of his January 3 ED visit and if blood pressures continue to be elevated.   Dorena Bodo, FNP-C

## 2020-12-18 ENCOUNTER — Telehealth: Payer: Self-pay | Admitting: Cardiology

## 2020-12-18 ENCOUNTER — Telehealth: Payer: Self-pay | Admitting: Family Medicine

## 2020-12-18 NOTE — Telephone Encounter (Signed)
Patient called stating that he was seen at [facility] 12/14/2020 for chest pains. Had EKG ,chest xray was told normal. Patient wants to know if he needs to be seen for follow up.

## 2020-12-18 NOTE — Telephone Encounter (Signed)
Contacted patient and he stated that he still felt like he had a "needling" sensation in his chest. No SOB or pain in any other area of his body. Will schedule follow up apt.

## 2020-12-19 LAB — NOVEL CORONAVIRUS, NAA: SARS-CoV-2, NAA: NOT DETECTED

## 2020-12-19 LAB — SARS-COV-2, NAA 2 DAY TAT

## 2020-12-20 NOTE — Telephone Encounter (Signed)
Note not required.

## 2020-12-21 NOTE — Progress Notes (Signed)
Cardiology Office Note  Date: 12/21/2020   ID: Jeremy Riley, DOB Oct 16, 1961, MRN [id number]  PCP:  Kathyrn Drown, MD  Cardiologist:  Rozann Lesches, MD Electrophysiologist:  None   Chief Complaint: Recent ER visit on 12/14/2020 with chest pain.  Follow-up for chest pain  History of Present Illness: Jeremy Riley is a 60 y.o. male with a history of CAD, HTN, HLD, prediabetes, lumbar disc disease, kidney stones.  Cardiac catheterization 05/22/2019 with 99% distal circumflex status post DES, luminal irregularities in LAD and dominant RCA.  Last encounter with Dr. Domenic Polite 10/02/2020.  He presented for routine visit.  Stated he was doing well without anginal symptoms or major functional limitations.  Had lab work recently done with LDL 56.  Blood pressure was better controlled.   He presented to [facility] emergency room on 12/14/2020 11:28 AM stating he had chest tightness since the previous night but it got worse while he was at work.  Described as central chest tightness and associated sweating.  Denied any nausea, vomiting or diaphoresis.  Currently he left without being seen.  Presented on 12/17/2020 complaint of congestion chest.  He presented to [facility] family medicine on December 17, 2020.  He stated on January 3 he was working at [employer] when he broke out in a sweat and experienced congestion.  Went to the emergency room where they did an EKG which was normal and did a chest x-ray which was normal and checked his blood pressure which was elevated.  He was treated for otitis media with Augmentin.  He was given albuterol inhaler every 6 hours as needed for wheezing or shortness of breath.  Emergency room EKG on 12/14/2020 showed normal sinus rhythm with a rate of 92.  Chest x-ray emergency room showed no acute abnormality of the lungs.  He had a Covid test at [facility] which was negative.  He presents today status post recent emergency room visit for chest  tightness and diaphoresis.  He states he did not stay at the emergency room due to significant overcrowding and long wait times.  [facility].  He states he has had no further episodes of chest tightness or sweating.  States he was having tingling all over his body when this occurred.  He denies any recent acute illness but was treated for possible ear infection by [facility] with an antibiotic.  He was also given an inhaler.  Today he denies any anginal or exertional symptoms, palpitations or arrhythmias, orthostatic symptoms, CVA or TIA-like symptoms, PND, orthopnea, bleeding issues, claudication-like symptoms, DVT or PE like symptoms, or lower extremity edema.   Past Medical History:  Diagnosis Date   Coronary artery disease    a. LHC 05/22/2019: 99% dCx s/p DES, luminal irregularities in LAD and dominant RCA   Essential hypertension    History of pneumonia as a child    Hyperlipidemia    Lumbar disc disease    Fall from ladder   Lumbar pain 05/18/2020   Patient with known history of sciatica.  On multiple medicines for his heart cannot take anti-inflammatories, intermittent use of tramadol prescribed   Nephrolithiasis    Pre-diabetes    Hgb A1c 6.3 in 05/2019.    Past Surgical History:  Procedure Laterality Date   CORONARY STENT INTERVENTION N/A 05/22/2019   Procedure: CORONARY STENT INTERVENTION;  Surgeon: Belva Crome, MD;  Location: [place] [facility];  Service: Cardiovascular;  Laterality: N/A;   EXTRACORPOREAL SHOCK WAVE LITHOTRIPSY Right 07/21/2020   Procedure: EXTRACORPOREAL SHOCK WAVE LITHOTRIPSY (ESWL);  Surgeon: Cleon Gustin, MD;  Location: [facility];  Service: Urology;  Laterality: Right;   LEFT HEART CATH AND CORONARY ANGIOGRAPHY N/A 05/22/2019   Procedure: LEFT HEART CATH AND CORONARY ANGIOGRAPHY;  Surgeon: Belva Crome, MD;  Location: [place] [facility];  Service: Cardiovascular;  Laterality: N/A;     Current Outpatient Medications  Medication Sig Dispense Refill   acetaminophen (TYLENOL) 500 MG tablet Take 500 mg by mouth every 6 (six) hours as needed for mild pain or fever.      albuterol (VENTOLIN HFA) 108 (90 Base) MCG/ACT inhaler Inhale 2 puffs into the lungs every 6 (six) hours as needed for wheezing or shortness of breath. 8 g 0   amoxicillin-clavulanate (AUGMENTIN) 875-125 MG tablet Take 1 tablet by mouth 2 (two) times daily. 20 tablet 0   aspirin 81 MG chewable tablet Chew 1 tablet (81 mg total) by mouth daily.     atorvastatin (LIPITOR) 80 MG tablet Take 1 tablet (80 mg total) by mouth daily at 6 PM. 90 tablet 3   carvedilol (COREG) 3.125 MG tablet Take 1 tablet (3.125 mg total) by mouth 2 (two) times daily with a meal. 180 tablet 3   HYDROcodone-acetaminophen (NORCO/VICODIN) 5-325 MG tablet Take 1 tablet by mouth every 6 (six) hours as needed for moderate pain. (Patient not taking: Reported on 12/17/2020) 30 tablet 0   losartan-hydrochlorothiazide (HYZAAR) 100-12.5 MG tablet Take 1 tablet by mouth daily. 90 tablet 3   nitroGLYCERIN (NITROSTAT) 0.4 MG SL tablet Place 1 tablet (0.4 mg total) under the tongue every 5 (five) minutes x 3 doses as needed for chest pain. 25 tablet 3   tamsulosin (FLOMAX) 0.4 MG CAPS capsule TAKE 1 CAPSULE (0.4 MG TOTAL) BY MOUTH DAILY AFTER SUPPER. 90 capsule 1   traMADol (ULTRAM) 50 MG tablet Take 1 tablet (50 mg total) by mouth in the morning and at bedtime. For severe pain (Patient not taking: Reported on 12/17/2020) 5 tablet 0   No current facility-administered medications for this visit.   Allergies:  Patient has no known allergies.   Social History: The patient  reports that he quit smoking about 11 years ago. His smoking use included cigarettes. He quit after 30.00 years of use. His smokeless tobacco use includes snuff. He reports current alcohol use. He reports previous drug use.   Family History: The patient's family history includes  Heart disease in his father.   ROS:  Please see the history of present illness. Otherwise, complete review of systems is positive for none.  All other systems are reviewed and negative.   Physical Exam: VS:  There were no vitals taken for this visit., BMI There is no height or weight on file to calculate BMI.  Wt Readings from Last 3 Encounters:  12/14/20 246 lb 14.6 oz (112 kg)  10/02/20 246 lb 6.4 oz (111.8 kg)  10/01/20 249 lb (112.9 kg)    General: Patient appears comfortable at rest. Neck: Supple, no elevated JVP or carotid bruits, no thyromegaly. Lungs: Clear to auscultation, nonlabored breathing at rest. Cardiac: Regular rate and rhythm, no S3 or significant systolic murmur, no pericardial rub. Extremities: No pitting edema, distal pulses 2+. Skin: Warm and dry. Musculoskeletal: No kyphosis. Neuropsychiatric: Alert and oriented x3, affect grossly appropriate.  ECG:  EKG 12/14/2020 showed sinus rhythm with a rate of 92  Recent Labwork: 05/21/2020:  ALT 30; AST 26 07/14/2020: BUN 22; Creatinine, Ser 1.61; Hemoglobin 14.4; Platelets 223; Potassium 3.8; Sodium 140     Component Value Date/Time   CHOL 114 05/21/2020 0819   TRIG 106 05/21/2020 0819   HDL 38 (L) 05/21/2020 0819   CHOLHDL 3.0 05/21/2020 0819   CHOLHDL 3.2 07/18/2019 0839   VLDL 22 07/18/2019 0839   LDLCALC 56 05/21/2020 0819    Other Studies Reviewed Today:  Cardiac catheterization 05/22/2019:  Unstable angina pectoris  Successful DES implantation in distal circumflex reducing a culprit 99% stenosis to 0% using a 2.5 x 18 Onyx postdilated to 2.75 mm. TIMI grade III flow noted.  Normal left main.  Luminal irregularities in LAD  Luminal irregularities and dominant right coronary  RECOMMENDATIONS:   Aggressive secondary risk factor modification: Consider the diagnosis of sleep apnea; hemoglobin A1c should be less than 7; LDL cholesterol less than 70 and preferably 55 or less; blood pressure 130/80 mmHg  or less; moderate aerobic activity; discontinue vaping.  Eligible for discharge in a.m. if no problem   Diagnostic Dominance: Right    Intervention       Assessment and Plan:  1. Chest pain, unspecified type   2. CAD in native artery   3. Mixed hyperlipidemia   4. Essential hypertension    1. Chest pain/chest tightness Recent episode of chest tightness and diaphoresis at work with sensation of tingling over his body.  He went to the emergency room and had a chest x-ray and EKG.  Due to the long wait he chose to go to [facility] and was treated for possible otitis media and was given antibiotics and an inhaler.  He states this did not feel like the chest pain he had prior to his last stent. States he did not take nitroglycerin because he was not feeling the typical chest pain as he did 1 prior stent was placed.  Please get a Lexiscan stress test to assess for ischemic etiology.  Please get a basic metabolic panel and thyroid panel    2. CAD in native artery Previous DES to distal circumflex 05/22/2019 with luminal irregularities in LAD and luminal irregularities dominant right coronary artery.  Continue aspirin 81 mg daily.  Continue nitroglycerin 0.4 mg as needed.  Continue carvedilol 3.125 mg p.o. twice daily.  3. Mixed hyperlipidemia Continue high intensity statin therapy atorvastatin 80 mg daily.  4. Essential hypertension Blood pressure today 128/80.  Continue losartan/hydrochlorothiazide 100/12.5 mg daily.  Continue carvedilol 3.125 mg p.o. twice daily.    Medication Adjustments/Labs and Tests Ordered: Current medicines are reviewed at length with the patient today.  Concerns regarding medicines are outlined above.   Disposition: Follow-up with Dr. Domenic Polite or APP 4 weeks Signed, Levell July, NP 12/21/2020 10:37 AM    [facility] [address], [place], [place] [address] Phone: [phone]; Fax: [phone]

## 2020-12-21 NOTE — Telephone Encounter (Signed)
Apt made for tomorrow, 1/11 at 8 am with Amie Critchley in the [place] office at 8 am

## 2020-12-22 ENCOUNTER — Encounter: Payer: Self-pay | Admitting: *Deleted

## 2020-12-22 ENCOUNTER — Other Ambulatory Visit: Payer: Self-pay

## 2020-12-22 ENCOUNTER — Encounter: Payer: Self-pay | Admitting: Family Medicine

## 2020-12-22 ENCOUNTER — Ambulatory Visit (INDEPENDENT_AMBULATORY_CARE_PROVIDER_SITE_OTHER): Payer: BC Managed Care – PPO | Admitting: Family Medicine

## 2020-12-22 ENCOUNTER — Telehealth: Payer: Self-pay | Admitting: Family Medicine

## 2020-12-22 VITALS — BP 128/80 | HR 86 | Ht 68.0 in | Wt 248.8 lb

## 2020-12-22 DIAGNOSIS — R079 Chest pain, unspecified: Secondary | ICD-10-CM

## 2020-12-22 DIAGNOSIS — I2510 Atherosclerotic heart disease of native coronary artery without angina pectoris: Secondary | ICD-10-CM | POA: Diagnosis not present

## 2020-12-22 DIAGNOSIS — E782 Mixed hyperlipidemia: Secondary | ICD-10-CM | POA: Diagnosis not present

## 2020-12-22 DIAGNOSIS — I10 Essential (primary) hypertension: Secondary | ICD-10-CM | POA: Diagnosis not present

## 2020-12-22 DIAGNOSIS — R0789 Other chest pain: Secondary | ICD-10-CM

## 2020-12-22 DIAGNOSIS — R61 Generalized hyperhidrosis: Secondary | ICD-10-CM

## 2020-12-22 NOTE — Patient Instructions (Addendum)
Medication Instructions:  Continue all current medications.  Labwork: BMET, TSH, T4, T3 - orders given today.  Testing/Procedures: Your physician has requested that you have a lexiscan myoview. For further information please visit [email or web address]. Please follow instruction sheet, as given.  Follow-Up:  Office will contact with results via phone or letter.     4 weeks   Any Other Special Instructions Will Be Listed Below (If Applicable).  If you need a refill on your cardiac medications before your next appointment, please call your pharmacy.

## 2020-12-22 NOTE — Telephone Encounter (Signed)
Pre-cert Verification for the following procedure    LEXISCAN   DATE: 12/30/2020  LOCATION:[facility]  

## 2020-12-30 ENCOUNTER — Encounter (HOSPITAL_COMMUNITY)
Admission: RE | Admit: 2020-12-30 | Discharge: 2020-12-30 | Disposition: A | Discharge status: Home / Self Care | Payer: BC Managed Care – PPO | Source: Ambulatory Visit | Attending: Family Medicine | Admitting: Family Medicine

## 2020-12-30 ENCOUNTER — Other Ambulatory Visit (HOSPITAL_COMMUNITY)
Admission: RE | Admit: 2020-12-30 | Discharge: 2020-12-30 | Disposition: A | Discharge status: Home / Self Care | Payer: BC Managed Care – PPO | Source: Ambulatory Visit | Attending: Family Medicine | Admitting: Family Medicine

## 2020-12-30 ENCOUNTER — Other Ambulatory Visit: Payer: Self-pay

## 2020-12-30 ENCOUNTER — Ambulatory Visit (HOSPITAL_COMMUNITY)
Admission: RE | Admit: 2020-12-30 | Discharge: 2020-12-30 | Disposition: A | Discharge status: Home / Self Care | Payer: BC Managed Care – PPO | Source: Ambulatory Visit | Attending: Family Medicine | Admitting: Family Medicine

## 2020-12-30 ENCOUNTER — Encounter (HOSPITAL_COMMUNITY): Payer: Self-pay

## 2020-12-30 DIAGNOSIS — R079 Chest pain, unspecified: Secondary | ICD-10-CM | POA: Insufficient documentation

## 2020-12-30 DIAGNOSIS — R61 Generalized hyperhidrosis: Secondary | ICD-10-CM

## 2020-12-30 DIAGNOSIS — R0789 Other chest pain: Secondary | ICD-10-CM

## 2020-12-30 LAB — T4, FREE: Free T4: 0.89 ng/dL (ref 0.61–1.12)

## 2020-12-30 LAB — NM MYOCAR MULTI W/SPECT W/WALL MOTION / EF
LV dias vol: 99 mL (ref 62–150)
LV sys vol: 35 mL
Peak HR: 85 {beats}/min
RATE: 0.39
Rest HR: 63 {beats}/min
SDS: 3
SRS: 10
SSS: 13
TID: 1.11

## 2020-12-30 LAB — BASIC METABOLIC PANEL
Anion gap: 9 (ref 5–15)
BUN: 22 mg/dL — ABNORMAL HIGH (ref 6–20)
CO2: 29 mmol/L (ref 22–32)
Calcium: 9.4 mg/dL (ref 8.9–10.3)
Chloride: 102 mmol/L (ref 98–111)
Creatinine, Ser: 1.37 mg/dL — ABNORMAL HIGH (ref 0.61–1.24)
GFR, Estimated: 59 mL/min — ABNORMAL LOW (ref >60)
Glucose, Bld: 175 mg/dL — ABNORMAL HIGH (ref 70–99)
Potassium: 3.7 mmol/L (ref 3.5–5.1)
Sodium: 140 mmol/L (ref 135–145)

## 2020-12-30 LAB — TSH: TSH: 1.605 u[IU]/mL (ref 0.350–4.500)

## 2020-12-30 MED ORDER — TECHNETIUM TC 99M TETROFOSMIN IV KIT
10.0000 | PACK | Freq: Once | INTRAVENOUS | Status: AC | PRN
  Administered 2020-12-30: 11 via INTRAVENOUS

## 2020-12-30 MED ORDER — SODIUM CHLORIDE FLUSH 0.9 % IV SOLN
INTRAVENOUS | Status: AC
  Administered 2020-12-30: 10 mL via INTRAVENOUS
  Filled 2020-12-30: qty 10

## 2020-12-30 MED ORDER — REGADENOSON 0.4 MG/5ML IV SOLN
INTRAVENOUS | Status: AC
  Administered 2020-12-30: 0.4 mg via INTRAVENOUS
  Filled 2020-12-30: qty 5

## 2020-12-30 MED ORDER — TECHNETIUM TC 99M TETROFOSMIN IV KIT
30.0000 | PACK | Freq: Once | INTRAVENOUS | Status: AC | PRN
  Administered 2020-12-30: 31 via INTRAVENOUS

## 2020-12-31 ENCOUNTER — Telehealth: Payer: Self-pay | Admitting: *Deleted

## 2020-12-31 LAB — T3, FREE: T3, Free: 3.2 pg/mL (ref 2.0–4.4)

## 2020-12-31 MED ORDER — ISOSORBIDE MONONITRATE ER 30 MG PO TB24: 30.0000 mg | ORAL_TABLET | Freq: Every day | ORAL | 6 refills | Status: DC

## 2020-12-31 NOTE — Telephone Encounter (Signed)
-----   Message from Erma Heritage, [place] sent at 12/31/2020  8:02 AM EST ----- Ordered by Katina Dung, NP - Please let the patient know his electrolytes remain within a normal range and his kidney function has actually improved when compared to prior values in 07/2020.  Thyroid function is within a normal range as well.

## 2020-12-31 NOTE — Telephone Encounter (Signed)
Laurine Blazer, LPN  06/14/8888 1:69 AM EST Back to Top     Notified, copy to pcp.    New medication sent to [facility].

## 2020-12-31 NOTE — Telephone Encounter (Signed)
-----   Message from Erma Heritage, [place] sent at 12/31/2020  8:06 AM EST ----- Ordered by Katina Dung, NP - Please let the patient know that his stress test showed some scarring consistent with his prior blockage and did show an area of mild ischemia or potential blockage. Pumping function of the heart was normal. It was overall read as a low to intermediate risk study. If he is still having episodes of chest discomfort, would recommend a trial of Imdur 30 mg daily (can initially take 15 mg daily if he has a headache with this and titrate to 30 mg daily). It appears he has follow-up with Jonni Sanger in the next few weeks. If his pain has not improved or persists despite medical therapy, may need to consider a repeat cardiac catheterization. If any new or progressive symptoms in the interim that do not resolve with SL NTG, he should seek emergent evaluation.

## 2020-12-31 NOTE — Telephone Encounter (Signed)
Laurine Blazer, LPN  8/54/6270 3:50 AM EST Back to Top     Notified, copy to pcp.

## 2021-01-08 ENCOUNTER — Other Ambulatory Visit: Payer: Self-pay | Admitting: Family Medicine

## 2021-01-08 DIAGNOSIS — R0600 Dyspnea, unspecified: Secondary | ICD-10-CM

## 2021-01-18 NOTE — Progress Notes (Unsigned)
Cardiology Office Note  Date: 01/19/2021   ID: Jeremy Riley, DOB 1961-12-05, MRN [id number]  PCP:  Kathyrn Drown, MD  Cardiologist:  Rozann Lesches, MD Electrophysiologist:  None   Chief Complaint: Recent ER visit on 12/14/2020 with chest pain.  Follow-up for chest pain  History of Present Illness: Jeremy Riley is a 60 y.o. male with a history of CAD, HTN, HLD, prediabetes, lumbar disc disease, kidney stones.  Cardiac catheterization 05/22/2019 with 99% distal circumflex status post DES, luminal irregularities in LAD and dominant RCA.  He presented to [facility] emergency room on 12/14/2020 11:28 AM stating he had chest tightness since the previous night but it got worse while he was at work.  Described as central chest tightness and associated sweating.  Denied any nausea, vomiting, or diaphoresis.  He left without being seen.He stated he was working at [place] long when he broke out in a sweat and experienced congestion.  Went to the emergency room where they did an EKG which was normal and did a chest x-ray which was normal and checked his blood pressure which was elevated.   Presented on 12/17/2020 complaint of chest congestion to [facility].   He was treated for otitis media with Augmentin.  He was given albuterol inhaler every 6 hours as needed for wheezing or shortness of breath. He had a Covid test at [facility]  family medicine which was negative.  He presented last visit 12/22/2020 status post recent emergency room visit for chest tightness and diaphoresis. He stated he had no further episodes of chest tightness or sweating.  Stated he was having tingling all over his body when this occurred.    Here today for follow-up for recent stress test results.  States he is feeling much better and denies any recent anginal or exertional symptoms, diaphoresis, palpitations or arrhythmias, orthostatic symptoms, CVA or TIA-like symptoms, PND, orthopnea, bleeding, claudication,  DVT or PE-like symptoms, lower extremity edema.  States he knows he needs to lose weight and start eating better.  He states the Imdur is causing him to have headaches.  Past Medical History:  Diagnosis Date  . Coronary artery disease    a. LHC 05/22/2019: 99% dCx s/p DES, luminal irregularities in LAD and dominant RCA  . Essential hypertension   . History of pneumonia as a child   . Hyperlipidemia   . Lumbar disc disease    Fall from ladder  . Lumbar pain 05/18/2020   Patient with known history of sciatica.  On multiple medicines for his heart cannot take anti-inflammatories, intermittent use of tramadol prescribed  . Nephrolithiasis   . Pre-diabetes    Hgb A1c 6.3 in 05/2019.    Past Surgical History:  Procedure Laterality Date  . CORONARY STENT INTERVENTION N/A 05/22/2019   Procedure: CORONARY STENT INTERVENTION;  Surgeon: Belva Crome, MD;  Location: [place] [facility];  Service: Cardiovascular;  Laterality: N/A;  . EXTRACORPOREAL SHOCK WAVE LITHOTRIPSY Right 07/21/2020   Procedure: EXTRACORPOREAL SHOCK WAVE LITHOTRIPSY (ESWL);  Surgeon: Cleon Gustin, MD;  Location: [facility];  Service: Urology;  Laterality: Right;  . LEFT HEART CATH AND CORONARY ANGIOGRAPHY N/A 05/22/2019   Procedure: LEFT HEART CATH AND CORONARY ANGIOGRAPHY;  Surgeon: Belva Crome, MD;  Location: [place] [facility];  Service: Cardiovascular;  Laterality: N/A;    Current Outpatient Medications  Medication Sig Dispense Refill  . acetaminophen (TYLENOL) 500 MG tablet Take 500 mg by mouth every 6 (six) hours  as needed for mild pain or fever.     Marland Kitchen albuterol (VENTOLIN HFA) 108 (90 Base) MCG/ACT inhaler TAKE 2 PUFFS BY MOUTH EVERY 6 HOURS AS NEEDED FOR WHEEZE OR SHORTNESS OF BREATH 6.7 each 4  . aspirin 81 MG chewable tablet Chew 1 tablet (81 mg total) by mouth daily.    Marland Kitchen atorvastatin (LIPITOR) 80 MG tablet Take 1 tablet (80 mg total) by mouth daily at 6 PM. 90 tablet 3  . carvedilol (COREG) 3.125 MG tablet Take 1  tablet (3.125 mg total) by mouth 2 (two) times daily with a meal. 180 tablet 3  . isosorbide mononitrate (IMDUR) 30 MG 24 hr tablet Take 1 tablet (30 mg total) by mouth daily. (Patient taking differently: Take 15 mg by mouth daily.) 30 tablet 6  . losartan-hydrochlorothiazide (HYZAAR) 100-12.5 MG tablet Take 1 tablet by mouth daily. 90 tablet 3  . nitroGLYCERIN (NITROSTAT) 0.4 MG SL tablet Place 1 tablet (0.4 mg total) under the tongue every 5 (five) minutes x 3 doses as needed for chest pain. 25 tablet 3  . tamsulosin (FLOMAX) 0.4 MG CAPS capsule TAKE 1 CAPSULE (0.4 MG TOTAL) BY MOUTH DAILY AFTER SUPPER. 90 capsule 1   No current facility-administered medications for this visit.   Allergies:  Patient has no known allergies.   Social History: The patient  reports that he quit smoking about 12 years ago. His smoking use included cigarettes. He quit after 30.00 years of use. His smokeless tobacco use includes snuff. He reports current alcohol use. He reports previous drug use.   Family History: The patient's family history includes Heart disease in his father.   ROS:  Please see the history of present illness. Otherwise, complete review of systems is positive for none.  All other systems are reviewed and negative.   Physical Exam: VS:  BP 128/88   Pulse 81   Ht 5\' 8"  (1.727 m)   Wt 257 lb 3.2 oz (116.7 kg)   SpO2 94%   BMI 39.11 kg/m , BMI Body mass index is 39.11 kg/m.  Wt Readings from Last 3 Encounters:  01/19/21 257 lb 3.2 oz (116.7 kg)  12/22/20 248 lb 12.8 oz (112.9 kg)  12/14/20 246 lb 14.6 oz (112 kg)    General: Patient appears comfortable at rest. Neck: Supple, no elevated JVP or carotid bruits, no thyromegaly. Lungs: Clear to auscultation, nonlabored breathing at rest. Cardiac: Regular rate and rhythm, no S3 or significant systolic murmur, no pericardial rub. Extremities: No pitting edema, distal pulses 2+. Skin: Warm and dry. Musculoskeletal: No  kyphosis. Neuropsychiatric: Alert and oriented x3, affect grossly appropriate.  ECG:  EKG 12/14/2020 showed sinus rhythm with a rate of 92  Recent Labwork: 05/21/2020: ALT 30; AST 26 07/14/2020: Hemoglobin 14.4; Platelets 223 12/30/2020: BUN 22; Creatinine, Ser 1.37; Potassium 3.7; Sodium 140; TSH 1.605     Component Value Date/Time   CHOL 114 05/21/2020 0819   TRIG 106 05/21/2020 0819   HDL 38 (L) 05/21/2020 0819   CHOLHDL 3.0 05/21/2020 0819   CHOLHDL 3.2 07/18/2019 0839   VLDL 22 07/18/2019 0839   LDLCALC 56 05/21/2020 0819    Other Studies Reviewed Today:   Nuclear stress test 12/30/2020 Narrative & Impression   No diagnostic ST segment changes to indicate ischemia.  Medium sized, moderate intensity, apical to basal inferior defect with partial reversibility involving the apical inferior septal wall, also partial reversibility in the basal inferolateral wall. This is consistent with scar and mild peri-infarct ischemia.  This is a low to intermediate risk study.  Nuclear stress EF: 64%.       Cardiac catheterization 05/22/2019:  Unstable angina pectoris  Successful DES implantation in distal circumflex reducing a culprit 99% stenosis to 0% using a 2.5 x 18 Onyx postdilated to 2.75 mm. TIMI grade III flow noted.  Normal left main.  Luminal irregularities in LAD  Luminal irregularities and dominant right coronary  RECOMMENDATIONS:   Aggressive secondary risk factor modification: Consider the diagnosis of sleep apnea; hemoglobin A1c should be less than 7; LDL cholesterol less than 70 and preferably 55 or less; blood pressure 130/80 mmHg or less; moderate aerobic activity; discontinue vaping.  Eligible for discharge in a.m. if no problem   Diagnostic Dominance: Right    Intervention       Assessment and Plan:   1. Chest pain/chest tightness Recent stress test 12/30/2020 was deemed a low to intermediate risk study, with medium size moderate intensity  apical to basal inferior defect with partial reversibility on apical inferior septal wall consistent with scar and mild peri-infarct ischemia.  He states he is feeling much better since last visit.  No recent anginal or exertional symptoms or nitroglycerin use states he is not tolerating the Imdur very well due to headaches.  Advised to stop Imdur.  Continue aspirin 81 mg.  Continue sublingual nitroglycerin as needed      2. CAD in native artery Previous DES to distal circumflex 05/22/2019 with luminal irregularities in LAD and luminal irregularities dominant right coronary artery.  Continue aspirin 81 mg daily.  Continue nitroglycerin 0.4 mg as needed.  Continue carvedilol 3.125 mg p.o. twice daily.  3. Mixed hyperlipidemia Continue high intensity statin therapy atorvastatin 80 mg daily.  4. Essential hypertension Blood pressure today 128/88 continue losartan/hydrochlorothiazide 100/12.5 mg daily.  Continue carvedilol 3.125 mg p.o. twice daily.  5.  Morbid obesity Advised exercise plan, dietary changes, weight loss.  Patient states he is aware he needs to lose weight  Medication Adjustments/Labs and Tests Ordered: Current medicines are reviewed at length with the patient today.  Concerns regarding medicines are outlined above.   Disposition: Follow-up with Dr. Domenic Polite at scheduled appointment in April. Signed, Levell July, NP 01/19/2021 8:49 AM    [facility] [address], [place], [place] [address] Phone: [phone]; Fax: [phone]

## 2021-01-19 ENCOUNTER — Encounter: Payer: Self-pay | Admitting: Family Medicine

## 2021-01-19 ENCOUNTER — Ambulatory Visit: Payer: BC Managed Care – PPO | Admitting: Family Medicine

## 2021-01-19 VITALS — BP 128/88 | HR 81 | Ht 68.0 in | Wt 257.2 lb

## 2021-01-19 DIAGNOSIS — R079 Chest pain, unspecified: Secondary | ICD-10-CM | POA: Diagnosis not present

## 2021-01-19 DIAGNOSIS — I2510 Atherosclerotic heart disease of native coronary artery without angina pectoris: Secondary | ICD-10-CM

## 2021-01-19 DIAGNOSIS — I10 Essential (primary) hypertension: Secondary | ICD-10-CM

## 2021-01-19 DIAGNOSIS — E782 Mixed hyperlipidemia: Secondary | ICD-10-CM

## 2021-01-19 NOTE — Patient Instructions (Addendum)
Medication Instructions:   Stop Imdur (Isosorbide).  Continue all other medications.    Labwork: none  Testing/Procedures: none  Follow-Up: April   Any Other Special Instructions Will Be Listed Below (If Applicable).  If you need a refill on your cardiac medications before your next appointment, please call your pharmacy.

## 2021-03-19 ENCOUNTER — Other Ambulatory Visit: Payer: Self-pay | Admitting: Cardiology

## 2021-04-01 ENCOUNTER — Ambulatory Visit: Payer: BC Managed Care – PPO | Admitting: Urology

## 2021-04-05 ENCOUNTER — Ambulatory Visit (INDEPENDENT_AMBULATORY_CARE_PROVIDER_SITE_OTHER): Payer: BC Managed Care – PPO | Admitting: Urology

## 2021-04-05 ENCOUNTER — Other Ambulatory Visit: Payer: Self-pay

## 2021-04-05 ENCOUNTER — Ambulatory Visit (HOSPITAL_COMMUNITY)
Admission: RE | Admit: 2021-04-05 | Discharge: 2021-04-05 | Disposition: A | Discharge status: Home / Self Care | Payer: BC Managed Care – PPO | Source: Ambulatory Visit | Attending: Urology | Admitting: Urology

## 2021-04-05 ENCOUNTER — Encounter: Payer: Self-pay | Admitting: Urology

## 2021-04-05 VITALS — BP 160/91 | HR 75 | Temp 98.0°F | Ht 68.0 in | Wt 240.0 lb

## 2021-04-05 DIAGNOSIS — N200 Calculus of kidney: Secondary | ICD-10-CM | POA: Diagnosis not present

## 2021-04-05 DIAGNOSIS — N401 Enlarged prostate with lower urinary tract symptoms: Secondary | ICD-10-CM

## 2021-04-05 DIAGNOSIS — N138 Other obstructive and reflux uropathy: Secondary | ICD-10-CM | POA: Diagnosis not present

## 2021-04-05 DIAGNOSIS — N2889 Other specified disorders of kidney and ureter: Secondary | ICD-10-CM | POA: Diagnosis not present

## 2021-04-05 DIAGNOSIS — M47816 Spondylosis without myelopathy or radiculopathy, lumbar region: Secondary | ICD-10-CM | POA: Diagnosis not present

## 2021-04-05 DIAGNOSIS — R3912 Poor urinary stream: Secondary | ICD-10-CM

## 2021-04-05 LAB — MICROSCOPIC EXAMINATION
Bacteria, UA: NONE SEEN
Epithelial Cells (non renal): NONE SEEN /hpf (ref 0–10)
RBC, Urine: NONE SEEN /hpf (ref 0–2)
Renal Epithel, UA: NONE SEEN /hpf
WBC, UA: NONE SEEN /hpf (ref 0–5)

## 2021-04-05 LAB — URINALYSIS, ROUTINE W REFLEX MICROSCOPIC
Bilirubin, UA: NEGATIVE
Glucose, UA: NEGATIVE
Leukocytes,UA: NEGATIVE
Nitrite, UA: NEGATIVE
RBC, UA: NEGATIVE
Specific Gravity, UA: 1.025 (ref 1.005–1.030)
Urobilinogen, Ur: 0.2 mg/dL (ref 0.2–1.0)
pH, UA: 6 (ref 5.0–7.5)

## 2021-04-05 MED ORDER — TAMSULOSIN HCL 0.4 MG PO CAPS: 0.4000 mg | ORAL_CAPSULE | Freq: Every day | ORAL | 3 refills | Status: DC

## 2021-04-05 NOTE — Progress Notes (Signed)
Urological Symptom Review  Patient is experiencing the following symptoms: Kidney stones   Review of Systems  Gastrointestinal (upper)  : Negative for upper GI symptoms  Gastrointestinal (lower) : Negative for lower GI symptoms  Constitutional : Negative for symptoms  Skin: Negative for skin symptoms  Eyes: Negative for eye symptoms  Ear/Nose/Throat : Negative for Ear/Nose/Throat symptoms  Hematologic/Lymphatic: Negative for Hematologic/Lymphatic symptoms  Cardiovascular : Negative for cardiovascular symptoms  Respiratory : Negative for respiratory symptoms  Endocrine: Negative for endocrine symptoms  Musculoskeletal: Negative for musculoskeletal symptoms  Neurological: Negative for neurological symptoms  Psychologic: Negative for psychiatric symptoms

## 2021-04-05 NOTE — Patient Instructions (Signed)

## 2021-04-05 NOTE — Progress Notes (Signed)
04/05/2021 10:35 AM   Jeremy Riley 04/01/1961 [id number]  Referring provider: Kathyrn Drown, MD [address] [place],  [place] [address]  followup nephrolithiasis  HPI: Jeremy Riley is a 60yo here for followup for nephrolithiasis. No stone events since last visit. On KUB today he has stable bilateral renal calculi. He denies flank pain. He does not worsening LUTS over the past 12-14 months. IPSS 23 QOL 5. STream fair. Nocturia 3-4x. He has urinary hesitancy and straining to urinate. No prior BPH therapy.    PMH: Past Medical History:  Diagnosis Date  . Coronary artery disease    a. LHC 05/22/2019: 99% dCx s/p DES, luminal irregularities in LAD and dominant RCA  . Essential hypertension   . History of pneumonia as a child   . Hyperlipidemia   . Lumbar disc disease    Fall from ladder  . Lumbar pain 05/18/2020   Patient with known history of sciatica.  On multiple medicines for his heart cannot take anti-inflammatories, intermittent use of tramadol prescribed  . Nephrolithiasis   . Pre-diabetes    Hgb A1c 6.3 in 05/2019.    Surgical History: Past Surgical History:  Procedure Laterality Date  . CORONARY STENT INTERVENTION N/A 05/22/2019   Procedure: CORONARY STENT INTERVENTION;  Surgeon: Belva Crome, MD;  Location: [facility] [facility];  Service: Cardiovascular;  Laterality: N/A;  . EXTRACORPOREAL SHOCK WAVE LITHOTRIPSY Right 07/21/2020   Procedure: EXTRACORPOREAL SHOCK WAVE LITHOTRIPSY (ESWL);  Surgeon: Cleon Gustin, MD;  Location: [facility];  Service: Urology;  Laterality: Right;  . LEFT HEART CATH AND CORONARY ANGIOGRAPHY N/A 05/22/2019   Procedure: LEFT HEART CATH AND CORONARY ANGIOGRAPHY;  Surgeon: Belva Crome, MD;  Location: [place] [facility];  Service: Cardiovascular;  Laterality: N/A;    Home Medications:  Allergies as of 04/05/2021   No Known Allergies     Medication List       Accurate as of April 05, 2021 10:35 AM. If you have any questions, ask  your nurse or doctor.        acetaminophen 500 MG tablet Commonly known as: TYLENOL Take 500 mg by mouth every 6 (six) hours as needed for mild pain or fever.   albuterol 108 (90 Base) MCG/ACT inhaler Commonly known as: VENTOLIN HFA TAKE 2 PUFFS BY MOUTH EVERY 6 HOURS AS NEEDED FOR WHEEZE OR SHORTNESS OF BREATH   aspirin 81 MG chewable tablet Chew 1 tablet (81 mg total) by mouth daily.   atorvastatin 80 MG tablet Commonly known as: LIPITOR TAKE 1 TABLET (80 MG TOTAL) BY MOUTH DAILY AT 6 PM.   carvedilol 3.125 MG tablet Commonly known as: COREG Take 1 tablet (3.125 mg total) by mouth 2 (two) times daily with a meal.   losartan-hydrochlorothiazide 100-12.5 MG tablet Commonly known as: HYZAAR Take 1 tablet by mouth daily.   nitroGLYCERIN 0.4 MG SL tablet Commonly known as: NITROSTAT Place 1 tablet (0.4 mg total) under the tongue every 5 (five) minutes x 3 doses as needed for chest pain.   tamsulosin 0.4 MG Caps capsule Commonly known as: FLOMAX Take 1 capsule (0.4 mg total) by mouth daily after supper.       Allergies: No Known Allergies  Family History: Family History  Problem Relation Age of Onset  . Heart disease Father        CABG age 108's    Social History:  reports that he quit smoking about 12 years ago. His smoking use  included cigarettes. He quit after 30.00 years of use. His smokeless tobacco use includes snuff. He reports current alcohol use. He reports previous drug use.  ROS: All other review of systems were reviewed and are negative except what is noted above in HPI  Physical Exam: BP (!) 160/91   Pulse 75   Temp 98 F (36.7 C)   Ht 5\' 8"  (1.727 m)   Wt 240 lb (108.9 kg)   BMI 36.49 kg/m   Constitutional:  Alert and oriented, No acute distress. HEENT: [place] AT, moist mucus membranes.  Trachea midline, no masses. Cardiovascular: No clubbing, cyanosis, or edema. Respiratory: Normal respiratory effort, no increased work of breathing. GI: Abdomen  is soft, nontender, nondistended, no abdominal masses GU: No CVA tenderness.  Lymph: No cervical or inguinal lymphadenopathy. Skin: No rashes, bruises or suspicious lesions. Neurologic: Grossly intact, no focal deficits, moving all 4 extremities. Psychiatric: Normal mood and affect.  Laboratory Data: Lab Results  Component Value Date   WBC 6.7 07/14/2020   HGB 14.4 07/14/2020   HCT 42.5 07/14/2020   MCV 89.3 07/14/2020   PLT 223 07/14/2020    Lab Results  Component Value Date   CREATININE 1.37 (H) 12/30/2020    No results found for: PSA  No results found for: TESTOSTERONE  Lab Results  Component Value Date   HGBA1C 5.9 (H) 07/18/2019    Urinalysis    Component Value Date/Time   COLORURINE YELLOW 05/21/2008 0638   APPEARANCEUR Clear 10/01/2020 0959   LABSPEC >1.030 (H) 05/21/2008 0638   PHURINE 5.0 05/21/2008 0638   GLUCOSEU Negative 10/01/2020 0959   HGBUR LARGE (A) 05/21/2008 0638   BILIRUBINUR Negative 10/01/2020 0959   KETONESUR negative 06/23/2020 0937   KETONESUR NEGATIVE 05/21/2008 0638   PROTEINUR Trace (A) 10/01/2020 0959   PROTEINUR NEGATIVE 05/21/2008 0638   UROBILINOGEN 0.2 06/23/2020 0937   UROBILINOGEN 0.2 05/21/2008 0638   NITRITE Negative 10/01/2020 0959   NITRITE NEGATIVE 05/21/2008 0638   LEUKOCYTESUR Negative 10/01/2020 0959    Lab Results  Component Value Date   LABMICR Comment 10/01/2020   WBCUA None seen 07/20/2020   LABEPIT None seen 07/20/2020   MUCUS Present 07/20/2020   BACTERIA None seen 07/20/2020    Pertinent Imaging: KUb today: Images reviewed and discussed with the patient Results for orders placed during the hospital encounter of 09/03/20  DG Abd 1 View  Narrative CLINICAL DATA:  Intermittent right flank pain. Recent lithotripsy 2 weeks ago.  EXAM: ABDOMEN - 1 VIEW  COMPARISON:  Abdominal x-ray dated August 05, 2020. CT abdomen pelvis dated June 29, 2020.  FINDINGS: No discrete ureteral calculi. Unchanged  left renal calculi. Known right renal calculi are obscured by overlying stool. The visualized bowel gas pattern is normal. No acute osseous abnormality.  IMPRESSION: 1. No definite residual right ureteral calculus. 2. Unchanged left nephrolithiasis. Known right nephrolithiasis is obscured by stool.   Electronically Signed By: Titus Dubin M.D. On: 09/03/2020 16:15  No results found for this or any previous visit.  No results found for this or any previous visit.  No results found for this or any previous visit.  Results for orders placed during the hospital encounter of 09/16/20  Ultrasound renal complete  Narrative CLINICAL DATA:  Initial evaluation for nephrolithiasis. History of recent lithotripsy on 07/21/2020. Small  EXAM: RENAL / URINARY TRACT ULTRASOUND COMPLETE  COMPARISON:  Prior CT from 06/29/2020.  FINDINGS: Right Kidney:  Renal measurements: 13.4 x 5.5 x 6.3 cm = volume:  240.6 mL. Renal echogenicity within normal limits. 4 mm nonobstructive calculus present at the interpolar region. No other definite calculi visible by ultrasound. No hydronephrosis. No focal renal mass.  Left Kidney:  Renal measurements: 12.5 x 6.2 x 6.8 cm = volume: 279.2 mL. Renal echogenicity within normal limits. 11 mm nonobstructive calculus present at the mid-lower pole. Few additional probable scattered nonobstructive calculi suspected as well. No hydronephrosis. No focal renal mass.  Bladder:  Appears normal for degree of bladder distention.  Other:  None.  IMPRESSION: Bilateral nonobstructive nephrolithiasis measuring up to 4 mm on the right and 11 mm on the left. No hydronephrosis or obstructive uropathy.   Electronically Signed By: Jeannine Boga M.D. On: 09/17/2020 00:02  No results found for this or any previous visit.  No results found for this or any previous visit.  Results for orders placed in visit on 06/29/20  CT RENAL STONE  STUDY  Narrative CLINICAL DATA:  Right-sided flank pain with hematuria  EXAM: CT ABDOMEN AND PELVIS WITHOUT CONTRAST  TECHNIQUE: Multidetector CT imaging of the abdomen and pelvis was performed following the standard protocol without IV contrast.  COMPARISON:  CT 05/21/2008  FINDINGS: Lower chest: Lung bases demonstrate no acute consolidation or effusion. Coronary vascular calcifications.  Hepatobiliary: No focal liver abnormality is seen. No gallstones, gallbladder wall thickening, or biliary dilatation.  Pancreas: Unremarkable. No pancreatic ductal dilatation or surrounding inflammatory changes.  Spleen: Normal in size without focal abnormality.  Adrenals/Urinary Tract: Adrenal glands are normal. Multiple intrarenal stones bilaterally. Largest stone on the left is seen in the mid to lower pole and measures 11 mm. Largest stone on the right is seen within the midpole and measures up to 4 mm in size. No significant hydronephrosis or hydroureter. 6 mm stone in the proximal to mid right ureter at the inferior aspect of L4. The urinary bladder is slightly thick walled.  Stomach/Bowel: Stomach is within normal limits. Appendix appears normal. No evidence of bowel wall thickening, distention, or inflammatory changes. Diverticular disease of the left colon without acute inflammatory change.  Vascular/Lymphatic: Mild aortic atherosclerosis without aneurysm. No suspicious nodes.  Reproductive: Slightly enlarged prostate with coarse calcifications.  Other: Negative for free air or free fluid. Small fat containing umbilical hernia. Small fat containing inguinal hernias.  Musculoskeletal: No acute or significant osseous findings.  IMPRESSION: 1. 6 mm stone within the proximal to mid right ureter at approximate inferior L4 level, though without significant hydronephrosis or proximal hydroureter. 2. Numerous intrarenal stones bilaterally 3. Diverticular disease of the left  colon without acute inflammatory change.  These results will be called to the ordering clinician or representative by the Radiology Department at the imaging location.   Electronically Signed By: Donavan Foil M.D. On: 06/29/2020 17:56   Assessment & Plan:    1. Nephrolithiasis -RTC 1 year with KUB - Urinalysis, Routine w reflex microscopic - Abdomen 1 view (KUB); Future  2. Benign prostatic hyperplasia with urinary obstruction -flomax 0.4mg   3. Weak urinary stream -flomax 0.4mg     Return in about 1 year (around 04/05/2022) for kub.  Nicolette Bang, MD  [facility] [place]

## 2021-04-29 ENCOUNTER — Ambulatory Visit: Payer: BC Managed Care – PPO | Admitting: Cardiology

## 2021-05-05 ENCOUNTER — Other Ambulatory Visit: Payer: Self-pay | Admitting: Cardiology

## 2021-06-08 ENCOUNTER — Other Ambulatory Visit: Payer: Self-pay

## 2021-06-08 ENCOUNTER — Ambulatory Visit: Payer: BC Managed Care – PPO | Admitting: Cardiology

## 2021-06-08 ENCOUNTER — Encounter: Payer: Self-pay | Admitting: Cardiology

## 2021-06-08 VITALS — BP 108/58 | HR 86 | Ht 68.0 in | Wt 252.0 lb

## 2021-06-08 DIAGNOSIS — I10 Essential (primary) hypertension: Secondary | ICD-10-CM

## 2021-06-08 DIAGNOSIS — E782 Mixed hyperlipidemia: Secondary | ICD-10-CM

## 2021-06-08 DIAGNOSIS — I25119 Atherosclerotic heart disease of native coronary artery with unspecified angina pectoris: Secondary | ICD-10-CM

## 2021-06-08 NOTE — Progress Notes (Signed)
Cardiology Office Note  Date: 06/08/2021   ID: Jamauri, Kruzel 14-May-1961, MRN [id number]  PCP:  Kathyrn Drown, MD  Cardiologist:  Rozann Lesches, MD Electrophysiologist:  None   Chief Complaint  Patient presents with   Cardiac follow-up     History of Present Illness: SLAYDE BRAULT is a 60 y.o. male last seen in February by Mr. Leonides Sake NP.  He presents for a routine visit.  Overall doing well, no active angina or nitroglycerin use with typical activities.  He continues to work with his [employer], has put off retirement for now.  I reviewed his medications which are noted below.  No change from a cardiac perspective.  He remains on high-dose Lipitor and has had good cholesterol control over time with last LDL 56.  He continues to follow with Dr. Wolfgang Phoenix.  Follow-up Lexiscan Myoview in January revealed a partially reversible inferior defect consistent with scar and mild peri-infarct ischemia, LVEF 64%.  We have continued medical therapy and observation in the absence of progressive angina.  Past Medical History:  Diagnosis Date   Coronary artery disease    a. LHC 05/22/2019: 99% dCx s/p DES, luminal irregularities in LAD and dominant RCA   Essential hypertension    History of pneumonia as a child    Hyperlipidemia    Lumbar disc disease    Fall from ladder   Lumbar pain 05/18/2020   Patient with known history of sciatica.  On multiple medicines for his heart cannot take anti-inflammatories, intermittent use of tramadol prescribed   Nephrolithiasis    Pre-diabetes    Hgb A1c 6.3 in 05/2019.    Past Surgical History:  Procedure Laterality Date   CORONARY STENT INTERVENTION N/A 05/22/2019   Procedure: CORONARY STENT INTERVENTION;  Surgeon: Belva Crome, MD;  Location: [facility];  Service: Cardiovascular;  Laterality: N/A;   EXTRACORPOREAL SHOCK WAVE LITHOTRIPSY Right 07/21/2020   Procedure: EXTRACORPOREAL SHOCK WAVE LITHOTRIPSY (ESWL);  Surgeon: Cleon Gustin, MD;  Location: [facility];  Service: Urology;  Laterality: Right;   LEFT HEART CATH AND CORONARY ANGIOGRAPHY N/A 05/22/2019   Procedure: LEFT HEART CATH AND CORONARY ANGIOGRAPHY;  Surgeon: Belva Crome, MD;  Location: [facility];  Service: Cardiovascular;  Laterality: N/A;    Current Outpatient Medications  Medication Sig Dispense Refill   acetaminophen (TYLENOL) 500 MG tablet Take 500 mg by mouth every 6 (six) hours as needed for mild pain or fever.      albuterol (VENTOLIN HFA) 108 (90 Base) MCG/ACT inhaler TAKE 2 PUFFS BY MOUTH EVERY 6 HOURS AS NEEDED FOR WHEEZE OR SHORTNESS OF BREATH 6.7 each 4   aspirin 81 MG chewable tablet Chew 1 tablet (81 mg total) by mouth daily.     atorvastatin (LIPITOR) 80 MG tablet TAKE 1 TABLET (80 MG TOTAL) BY MOUTH DAILY AT 6 PM. 90 tablet 3   carvedilol (COREG) 3.125 MG tablet TAKE 1 TABLET (3.125 MG TOTAL) BY MOUTH 2 (TWO) TIMES DAILY WITH A MEAL 180 tablet 3   losartan-hydrochlorothiazide (HYZAAR) 100-12.5 MG tablet Take 1 tablet by mouth daily. 90 tablet 3   nitroGLYCERIN (NITROSTAT) 0.4 MG SL tablet Place 1 tablet (0.4 mg total) under the tongue every 5 (five) minutes x 3 doses as needed for chest pain. 25 tablet 3   tamsulosin (FLOMAX) 0.4 MG CAPS capsule Take 1 capsule (0.4 mg total) by mouth daily after supper. 90 capsule 3   No current facility-administered  medications for this visit.   Allergies:  Patient has no known allergies.   ROS: No palpitations or syncope.  Physical Exam: VS:  BP (!) 108/58   Pulse 86   Ht 5\' 8"  (1.727 m)   Wt 252 lb (114.3 kg)   SpO2 95%   BMI 38.32 kg/m , BMI Body mass index is 38.32 kg/m.  Wt Readings from Last 3 Encounters:  06/08/21 252 lb (114.3 kg)  04/05/21 240 lb (108.9 kg)  01/19/21 257 lb 3.2 oz (116.7 kg)    General: Patient appears comfortable at rest. HEENT: Conjunctiva and lids normal, wearing a mask. Neck: Supple, no elevated JVP or carotid bruits, no thyromegaly. Lungs: Clear  to auscultation, nonlabored breathing at rest. Cardiac: Regular rate and rhythm, no S3 or significant systolic murmur, no pericardial rub. Extremities: No pitting edema.  ECG:  An ECG dated 12/14/2020 was personally reviewed today and demonstrated:  Sinus rhythm.  Recent Labwork: 07/14/2020: Hemoglobin 14.4; Platelets 223 12/30/2020: BUN 22; Creatinine, Ser 1.37; Potassium 3.7; Sodium 140; TSH 1.605     Component Value Date/Time   CHOL 114 05/21/2020 0819   TRIG 106 05/21/2020 0819   HDL 38 (L) 05/21/2020 0819   CHOLHDL 3.0 05/21/2020 0819   CHOLHDL 3.2 07/18/2019 0839   VLDL 22 07/18/2019 0839   LDLCALC 56 05/21/2020 0819    Other Studies Reviewed Today:  [facility] Myoview 12/30/2020: No diagnostic ST segment changes to indicate ischemia. Medium sized, moderate intensity, apical to basal inferior defect with partial reversibility involving the apical inferior septal wall, also partial reversibility in the basal inferolateral wall. This is consistent with scar and mild peri-infarct ischemia. This is a low to intermediate risk study. Nuclear stress EF: 64%.  Assessment and Plan:  1.  CAD status post DES to the distal circumflex in June 2020.  Follow-up Lexiscan Myoview in January of this year did show an inferior wall defect consistent with scar and mild peri-infarct ischemia, we continue medical therapy in the absence of progressive angina symptoms.  Currently on aspirin, Hyzaar, Coreg, Lipitor, and as needed nitroglycerin.  No changes were made today.  2.  Mixed hyperlipidemia, tolerating high-dose Lipitor.  Last LDL 56.  3.  Essential hypertension, blood pressure low normal today..  Medication Adjustments/Labs and Tests Ordered: Current medicines are reviewed at length with the patient today.  Concerns regarding medicines are outlined above.   Tests Ordered: No orders of the defined types were placed in this encounter.   Medication Changes: No orders of the defined types were  placed in this encounter.   Disposition:  Follow up  1 year.  Signed, Satira Sark, MD, [facility] 06/08/2021 8:37 AM    [facility] [place]. [address] [place], [place] [address] Phone: [phone]; Fax: [phone]

## 2021-06-08 NOTE — Patient Instructions (Signed)
Medication Instructions:  Your physician recommends that you continue on your current medications as directed. Please refer to the Current Medication list given to you today.   *If you need a refill on your cardiac medications before your next appointment, please call your pharmacy*   Lab Work: None today  If you have labs (blood work) drawn today and your tests are completely normal, you will receive your results only by: [place] (if you have MyChart) OR A paper copy in the mail If you have any lab test that is abnormal or we need to change your treatment, we will call you to review the results.   Testing/Procedures: None today    Follow-Up: At [facility], you and your health needs are our priority.  As part of our continuing mission to provide you with exceptional heart care, we have created designated Provider Care Teams.  These Care Teams include your primary Cardiologist (physician) and Advanced Practice Providers (APPs -  Physician Assistants and Nurse Practitioners) who all work together to provide you with the care you need, when you need it.  We recommend signing up for the patient portal called "MyChart".  Sign up information is provided on this After Visit Summary.  MyChart is used to connect with patients for Virtual Visits (Telemedicine).  Patients are able to view lab/test results, encounter notes, upcoming appointments, etc.  Non-urgent messages can be sent to your provider as well.   To learn more about what you can do with MyChart, go to [email or web address].    Your next appointment:   1 year(s)  The format for your next appointment:   In Person  Provider:   Rozann Lesches, MD   Other Instructions None

## 2021-07-01 ENCOUNTER — Other Ambulatory Visit: Payer: Self-pay | Admitting: Student

## 2021-11-23 ENCOUNTER — Other Ambulatory Visit: Payer: Self-pay

## 2021-11-23 ENCOUNTER — Ambulatory Visit
Admission: EM | Admit: 2021-11-23 | Discharge: 2021-11-23 | Disposition: A | Discharge status: Home / Self Care | Payer: BC Managed Care – PPO | Attending: Family Medicine | Admitting: Family Medicine

## 2021-11-23 DIAGNOSIS — R062 Wheezing: Secondary | ICD-10-CM | POA: Diagnosis not present

## 2021-11-23 DIAGNOSIS — R0602 Shortness of breath: Secondary | ICD-10-CM | POA: Diagnosis not present

## 2021-11-23 DIAGNOSIS — Z20828 Contact with and (suspected) exposure to other viral communicable diseases: Secondary | ICD-10-CM | POA: Diagnosis not present

## 2021-11-23 DIAGNOSIS — J069 Acute upper respiratory infection, unspecified: Secondary | ICD-10-CM

## 2021-11-23 DIAGNOSIS — J441 Chronic obstructive pulmonary disease with (acute) exacerbation: Secondary | ICD-10-CM

## 2021-11-23 MED ORDER — PREDNISONE 20 MG PO TABS: 40.0000 mg | ORAL_TABLET | Freq: Every day | ORAL | 0 refills | Status: DC

## 2021-11-23 MED ORDER — AZITHROMYCIN 250 MG PO TABS: ORAL_TABLET | ORAL | 0 refills | Status: DC

## 2021-11-23 MED ORDER — PROMETHAZINE-DM 6.25-15 MG/5ML PO SYRP: 5.0000 mL | ORAL_SOLUTION | Freq: Four times a day (QID) | ORAL | 0 refills | Status: DC | PRN

## 2021-11-23 MED ORDER — ALBUTEROL SULFATE HFA 108 (90 BASE) MCG/ACT IN AERS: 2.0000 | INHALATION_SPRAY | Freq: Four times a day (QID) | RESPIRATORY_TRACT | 0 refills | Status: DC | PRN

## 2021-11-23 NOTE — ED Triage Notes (Signed)
Patient states he took a Covid test yesterday and it was negative.   He states he has pain in his sides, scratchy throat, chills and a headache all started omn Sunday.   He tried Nyquil and it did not help.  Denies Fever.

## 2021-11-23 NOTE — ED Provider Notes (Signed)
[facility]    CSN: [id number] Arrival date & time: 11/23/21  1017      History   Chief Complaint No chief complaint on file.   HPI Jeremy Riley is a 60 y.o. male.   Presenting today with 2-day history of body aches, scratchy throat, chills, headache, congestion, cough, wheezing, chest tightness.  Denies known fever, chest pain, shortness of breath, abdominal pain, nausea vomiting or diarrhea.  Tried a dose of NyQuil that he states did not help.  Home COVID test was negative yesterday.  Does have a history of COPD, has an albuterol inhaler at home but it is almost out and he thinks it is expired.  Past Medical History:  Diagnosis Date   Coronary artery disease    a. LHC 05/22/2019: 99% dCx s/p DES, luminal irregularities in LAD and dominant RCA   Essential hypertension    History of pneumonia as a child    Hyperlipidemia    Lumbar disc disease    Fall from ladder   Lumbar pain 05/18/2020   Patient with known history of sciatica.  On multiple medicines for his heart cannot take anti-inflammatories, intermittent use of tramadol prescribed   Nephrolithiasis    Pre-diabetes    Hgb A1c 6.3 in 05/2019.   Patient Active Problem List   Diagnosis Date Noted   Benign prostatic hyperplasia with urinary obstruction 04/05/2021   Weak urinary stream 04/05/2021   Non-recurrent acute suppurative otitis media of left ear without spontaneous rupture of tympanic membrane 12/17/2020   Dyspnea 12/17/2020   Nephrolithiasis 07/20/2020   Lumbar pain 05/18/2020   Hyperlipidemia 05/23/2019   Pre-diabetes 05/23/2019   CAD S/P percutaneous coronary angioplasty 05/23/2019   Unstable angina ([place])    Chest pain 05/21/2019   Hypertensive urgency 05/21/2019   Hypokalemia 05/21/2019   HTN (hypertension) 10/03/2017   Past Surgical History:  Procedure Laterality Date   CORONARY STENT INTERVENTION N/A 05/22/2019   Procedure: CORONARY STENT INTERVENTION;  Surgeon: Belva Crome, MD;   Location: [facility];  Service: Cardiovascular;  Laterality: N/A;   EXTRACORPOREAL SHOCK WAVE LITHOTRIPSY Right 07/21/2020   Procedure: EXTRACORPOREAL SHOCK WAVE LITHOTRIPSY (ESWL);  Surgeon: Cleon Gustin, MD;  Location: [facility];  Service: Urology;  Laterality: Right;   LEFT HEART CATH AND CORONARY ANGIOGRAPHY N/A 05/22/2019   Procedure: LEFT HEART CATH AND CORONARY ANGIOGRAPHY;  Surgeon: Belva Crome, MD;  Location: [facility];  Service: Cardiovascular;  Laterality: N/A;     Home Medications    Prior to Admission medications   Medication Sig Start Date End Date Taking? Authorizing Provider  albuterol (VENTOLIN HFA) 108 (90 Base) MCG/ACT inhaler Inhale 2 puffs into the lungs every 6 (six) hours as needed for wheezing or shortness of breath. 11/23/21  Yes Volney American, PA-C  azithromycin (ZITHROMAX) 250 MG tablet Take first 2 tablets together, then 1 every day until finished. 11/23/21  Yes Volney American, PA-C  predniSONE (DELTASONE) 20 MG tablet Take 2 tablets (40 mg total) by mouth daily with breakfast. 11/23/21  Yes Volney American, PA-C  promethazine-dextromethorphan (PROMETHAZINE-DM) 6.25-15 MG/5ML syrup Take 5 mLs by mouth 4 (four) times daily as needed. 11/23/21  Yes Volney American, PA-C  acetaminophen (TYLENOL) 500 MG tablet Take 500 mg by mouth every 6 (six) hours as needed for mild pain or fever.     [provider]  albuterol (VENTOLIN HFA) 108 (90 Base) MCG/ACT inhaler TAKE 2 PUFFS BY MOUTH EVERY  6 HOURS AS NEEDED FOR WHEEZE OR SHORTNESS OF BREATH 01/10/21   Kathyrn Drown, MD  aspirin 81 MG chewable tablet Chew 1 tablet (81 mg total) by mouth daily. 05/24/19   Sande Rives E, PA-C  atorvastatin (LIPITOR) 80 MG tablet TAKE 1 TABLET (80 MG TOTAL) BY MOUTH DAILY AT 6 PM. 03/19/21   Satira Sark, MD  carvedilol (COREG) 3.125 MG tablet TAKE 1 TABLET (3.125 MG TOTAL) BY MOUTH 2 (TWO) TIMES DAILY WITH A MEAL 05/05/21    Satira Sark, MD  losartan-hydrochlorothiazide [facility]) 100-12.5 MG tablet TAKE 1 TABLET BY MOUTH EVERY DAY 07/01/21   Satira Sark, MD  nitroGLYCERIN (NITROSTAT) 0.4 MG SL tablet Place 1 tablet (0.4 mg total) under the tongue every 5 (five) minutes x 3 doses as needed for chest pain. 04/01/20   Satira Sark, MD  tamsulosin (FLOMAX) 0.4 MG CAPS capsule Take 1 capsule (0.4 mg total) by mouth daily after supper. 04/05/21   McKenzie, Candee Furbish, MD    Family History Family History  Problem Relation Age of Onset   Heart disease Father        CABG age 34's    Social History Social History   Tobacco Use   Smoking status: Former    Years: 30.00    Types: Cigarettes    Quit date: 01/08/2009    Years since quitting: 12.8   Smokeless tobacco: Current    Types: Snuff  Vaping Use   Vaping Use: Every day   Substances: Nicotine, Flavoring  Substance Use Topics   Alcohol use: Yes    Comment: occ   Drug use: Not Currently     Allergies   Patient has no known allergies.   Review of Systems Review of Systems Per HPI  Physical Exam Triage Vital Signs ED Triage Vitals  Enc Vitals Group     BP 11/23/21 1027 118/78     Pulse Rate 11/23/21 1027 90     Resp 11/23/21 1027 18     Temp 11/23/21 1027 98.2 F (36.8 C)     Temp Source 11/23/21 1027 Oral     SpO2 11/23/21 1027 94 %     Weight --      Height --      Head Circumference --      Peak Flow --      Pain Score 11/23/21 1024 5     Pain Loc --      Pain Edu? --      Excl. in [place]? --    No data found.  Updated Vital Signs BP 118/78 (BP Location: Right Arm)    Pulse 90    Temp 98.2 F (36.8 C) (Oral)    Resp 18    SpO2 94%   Visual Acuity Right Eye Distance:   Left Eye Distance:   Bilateral Distance:    Right Eye Near:   Left Eye Near:    Bilateral Near:     Physical Exam Vitals and nursing note reviewed.  Constitutional:      Appearance: He is well-developed.  HENT:     Head: Atraumatic.      Right Ear: External ear normal.     Left Ear: External ear normal.     Nose: Rhinorrhea present.     Mouth/Throat:     Mouth: Mucous membranes are moist.     Pharynx: Posterior oropharyngeal erythema present. No oropharyngeal exudate.  Eyes:     Conjunctiva/sclera: Conjunctivae normal.  Pupils: Pupils are equal, round, and reactive to light.  Cardiovascular:     Rate and Rhythm: Normal rate and regular rhythm.  Pulmonary:     Effort: Pulmonary effort is normal. No respiratory distress.     Breath sounds: Wheezing present. No rales.     Comments: Decreased breath sounds throughout.  Moderate wheezes throughout. Musculoskeletal:        General: Normal range of motion.     Cervical back: Normal range of motion and neck supple.  Lymphadenopathy:     Cervical: No cervical adenopathy.  Skin:    General: Skin is warm and dry.  Neurological:     Mental Status: He is alert and oriented to person, place, and time.  Psychiatric:        Behavior: Behavior normal.     UC Treatments / Results  Labs (all labs ordered are listed, but only abnormal results are displayed) Labs Reviewed  COVID-19, FLU A+B NAA    EKG   Radiology No results found.  Procedures Procedures (including critical care time)  Medications Ordered in UC Medications - No data to display  Initial Impression / Assessment and Plan / UC Course  I have reviewed the triage vital signs and the nursing notes.  Pertinent labs & imaging results that were available during my care of the patient were reviewed by me and considered in my medical decision making (see chart for details).     Vital signs overall reassuring, exam indicative of a viral upper respiratory with secondary COPD exacerbation.  We will treat with prednisone, azithromycin, albuterol inhaler, Phenergan DM.  Discussed supportive home care, over-the-counter medications and return precautions.  Final Clinical Impressions(s) / UC Diagnoses   Final  diagnoses:  Exposure to the flu  Viral URI with cough  Wheezing  SOB (shortness of breath)  COPD exacerbation [facility])   Discharge Instructions   None    ED Prescriptions     Medication Sig Dispense Auth. Provider   predniSONE (DELTASONE) 20 MG tablet Take 2 tablets (40 mg total) by mouth daily with breakfast. 10 tablet Volney American, PA-C   azithromycin (ZITHROMAX) 250 MG tablet Take first 2 tablets together, then 1 every day until finished. 6 tablet Volney American, [place]   albuterol (VENTOLIN HFA) 108 (90 Base) MCG/ACT inhaler Inhale 2 puffs into the lungs every 6 (six) hours as needed for wheezing or shortness of breath. [place], [place]   promethazine-dextromethorphan (PROMETHAZINE-DM) 6.25-15 MG/5ML syrup Take 5 mLs by mouth 4 (four) times daily as needed. 100 mL Volney American, [place]      PDMP not reviewed this encounter.   Volney American, [place] 11/23/21 [phone]

## 2021-11-25 LAB — COVID-19, FLU A+B NAA
Influenza A, NAA: DETECTED — AB
Influenza B, NAA: NOT DETECTED
SARS-CoV-2, NAA: NOT DETECTED

## 2021-12-16 ENCOUNTER — Telehealth: Payer: BC Managed Care – PPO | Admitting: Family Medicine

## 2021-12-16 DIAGNOSIS — B9789 Other viral agents as the cause of diseases classified elsewhere: Secondary | ICD-10-CM

## 2021-12-16 DIAGNOSIS — J028 Acute pharyngitis due to other specified organisms: Secondary | ICD-10-CM

## 2021-12-16 NOTE — Progress Notes (Signed)
E-Visit for Sore Throat  We are sorry that you are not feeling well.  Here is how we plan to help!  Your symptoms indicate a likely viral infection (Pharyngitis).   Pharyngitis is inflammation in the back of the throat which can cause a sore throat, scratchiness and sometimes difficulty swallowing.   Pharyngitis is typically caused by a respiratory virus and will just run its course.  Please keep in mind that your symptoms could last up to 10 days.  For throat pain, we recommend over the counter oral pain relief medications such as acetaminophen or aspirin, or anti-inflammatory medications such as ibuprofen or naproxen sodium.  Topical treatments such as oral throat lozenges or sprays may be used as needed.  Avoid close contact with loved ones, especially the very young and elderly.  Remember to wash your hands thoroughly throughout the day as this is the number one way to prevent the spread of infection and wipe down door knobs and counters with disinfectant.  After careful review of your answers, I would not recommend an antibiotic for your condition.  Antibiotics should not be used to treat conditions that we suspect are caused by viruses like the virus that causes the common cold or flu. However, some people can have Strep with atypical symptoms. You may need formal testing in clinic or office to confirm if your symptoms continue or worsen.  Providers prescribe antibiotics to treat infections caused by bacteria. Antibiotics are very powerful in treating bacterial infections when they are used properly.  To maintain their effectiveness, they should be used only when necessary.  Overuse of antibiotics has resulted in the development of super bugs that are resistant to treatment!    Home Care: Only take medications as instructed by your medical team. Do not drink alcohol while taking these medications. A steam or ultrasonic humidifier can help congestion.  You can place a towel over your head and  breathe in the steam from hot water coming from a faucet. Avoid close contacts especially the very young and the elderly. Cover your mouth when you cough or sneeze. Always remember to wash your hands.  Get Help Right Away If: You develop worsening fever or throat pain. You develop a severe head ache or visual changes. Your symptoms persist after you have completed your treatment plan.  Make sure you Understand these instructions. Will watch your condition. Will get help right away if you are not doing well or get worse.   Thank you for choosing an e-visit.  Your e-visit answers were reviewed by a board certified advanced clinical practitioner to complete your personal care plan. Depending upon the condition, your plan could have included both over the counter or prescription medications.  Please review your pharmacy choice. Make sure the pharmacy is open so you can pick up prescription now. If there is a problem, you may contact your provider through MyChart messaging and have the prescription routed to another pharmacy.  Your safety is important to us. If you have drug allergies check your prescription carefully.   For the next 24 hours you can use MyChart to ask questions about today's visit, request a non-urgent call back, or ask for a work or school excuse. You will get an email in the next two days asking about your experience. I hope that your e-visit has been valuable and will speed your recovery.   I provided 5 minutes of non face-to-face time during this encounter for chart review, medication and order placement, as   well as and documentation.   

## 2022-04-05 ENCOUNTER — Ambulatory Visit (HOSPITAL_COMMUNITY)
Admission: RE | Admit: 2022-04-05 | Discharge: 2022-04-05 | Disposition: A | Discharge status: Home / Self Care | Payer: BC Managed Care – PPO | Source: Ambulatory Visit | Attending: Urology | Admitting: Urology

## 2022-04-05 DIAGNOSIS — N200 Calculus of kidney: Secondary | ICD-10-CM | POA: Diagnosis not present

## 2022-04-06 ENCOUNTER — Ambulatory Visit (INDEPENDENT_AMBULATORY_CARE_PROVIDER_SITE_OTHER): Payer: BC Managed Care – PPO | Admitting: Urology

## 2022-04-06 VITALS — BP 148/94 | HR 77

## 2022-04-06 DIAGNOSIS — R3912 Poor urinary stream: Secondary | ICD-10-CM | POA: Diagnosis not present

## 2022-04-06 DIAGNOSIS — N200 Calculus of kidney: Secondary | ICD-10-CM

## 2022-04-06 DIAGNOSIS — N401 Enlarged prostate with lower urinary tract symptoms: Secondary | ICD-10-CM | POA: Diagnosis not present

## 2022-04-06 DIAGNOSIS — N138 Other obstructive and reflux uropathy: Secondary | ICD-10-CM | POA: Diagnosis not present

## 2022-04-06 LAB — URINALYSIS, ROUTINE W REFLEX MICROSCOPIC
Bilirubin, UA: NEGATIVE
Glucose, UA: NEGATIVE
Ketones, UA: NEGATIVE
Leukocytes,UA: NEGATIVE
Nitrite, UA: NEGATIVE
Protein,UA: NEGATIVE
Specific Gravity, UA: 1.025 (ref 1.005–1.030)
Urobilinogen, Ur: 1 mg/dL (ref 0.2–1.0)
pH, UA: 6 (ref 5.0–7.5)

## 2022-04-06 LAB — MICROSCOPIC EXAMINATION
Bacteria, UA: NONE SEEN
Epithelial Cells (non renal): NONE SEEN /hpf (ref 0–10)
Renal Epithel, UA: NONE SEEN /hpf
WBC, UA: NONE SEEN /hpf (ref 0–5)

## 2022-04-06 MED ORDER — OXYCODONE-ACETAMINOPHEN 5-325 MG PO TABS: 1.0000 | ORAL_TABLET | ORAL | 0 refills | Status: DC | PRN

## 2022-04-06 MED ORDER — TAMSULOSIN HCL 0.4 MG PO CAPS: 0.4000 mg | ORAL_CAPSULE | Freq: Every day | ORAL | 3 refills | Status: DC

## 2022-04-06 NOTE — Progress Notes (Signed)
? ?04/06/2022 ?9:44 AM  ? ?Jeremy Riley ?[birth year] ?[id number] ? ?Referring provider: Kathyrn Drown, MD ?[address] ?[address] ?[place],  [place] [address] ? ?Followup nephrolithiasis and BPH ? ? ?HPI: ?Jeremy Riley is a 61yo here for followup for nephrolithiasis and BPH with a weak urinary stream. He has stable mild LUTS on flomax 0.'4mg'$  daily. IPSS 5 QOL 1. Urine stream strong. Nocturia 1x.  ?KUb shows stable left rrnal calculi and no definitive right renal calculi. He denies nay flank pain. No other complaints today ? ? ?PMH: ?Past Medical History:  ?Diagnosis Date  ? Coronary artery disease   ? a. LHC 05/22/2019: 99% dCx s/p DES, luminal irregularities in LAD and dominant RCA  ? Essential hypertension   ? History of pneumonia as a child   ? Hyperlipidemia   ? Lumbar disc disease   ? Fall from ladder  ? Lumbar pain 05/18/2020  ? Patient with known history of sciatica.  On multiple medicines for his heart cannot take anti-inflammatories, intermittent use of tramadol prescribed  ? Nephrolithiasis   ? Pre-diabetes   ? Hgb A1c 6.3 in 05/2019.  ? ? ?Surgical History: ?Past Surgical History:  ?Procedure Laterality Date  ? CORONARY STENT INTERVENTION N/A 05/22/2019  ? Procedure: CORONARY STENT INTERVENTION;  Surgeon: Belva Crome, MD;  Location: [facility];  Service: Cardiovascular;  Laterality: N/A;  ? EXTRACORPOREAL SHOCK WAVE LITHOTRIPSY Right 07/21/2020  ? Procedure: EXTRACORPOREAL SHOCK WAVE LITHOTRIPSY (ESWL);  Surgeon: Cleon Gustin, MD;  Location: [facility];  Service: Urology;  Laterality: Right;  ? LEFT HEART CATH AND CORONARY ANGIOGRAPHY N/A 05/22/2019  ? Procedure: LEFT HEART CATH AND CORONARY ANGIOGRAPHY;  Surgeon: Belva Crome, MD;  Location: [facility];  Service: Cardiovascular;  Laterality: N/A;  ? ? ?Home Medications:  ?Allergies as of 04/06/2022   ?No Known Allergies ?  ? ?  ?Medication List  ?  ? ?  ? Accurate as of April 06, 2022  9:44 AM. If you have any questions, ask your nurse or  doctor.  ?  ?  ? ?  ? ?acetaminophen 500 MG tablet ?Commonly known as: TYLENOL ?Take 500 mg by mouth every 6 (six) hours as needed for mild pain or fever. ?  ?albuterol 108 (90 Base) MCG/ACT inhaler ?Commonly known as: VENTOLIN HFA ?TAKE 2 PUFFS BY MOUTH EVERY 6 HOURS AS NEEDED FOR WHEEZE OR SHORTNESS OF BREATH ?  ?albuterol 108 (90 Base) MCG/ACT inhaler ?Commonly known as: VENTOLIN HFA ?Inhale 2 puffs into the lungs every 6 (six) hours as needed for wheezing or shortness of breath. ?  ?aspirin 81 MG chewable tablet ?Chew 1 tablet (81 mg total) by mouth daily. ?  ?atorvastatin 80 MG tablet ?Commonly known as: LIPITOR ?TAKE 1 TABLET (80 MG TOTAL) BY MOUTH DAILY AT 6 PM. ?  ?azithromycin 250 MG tablet ?Commonly known as: ZITHROMAX ?Take first 2 tablets together, then 1 every day until finished. ?  ?carvedilol 3.125 MG tablet ?Commonly known as: COREG ?TAKE 1 TABLET (3.125 MG TOTAL) BY MOUTH 2 (TWO) TIMES DAILY WITH A MEAL ?  ?losartan-hydrochlorothiazide 100-12.5 MG tablet ?Commonly known as: HYZAAR ?TAKE 1 TABLET BY MOUTH EVERY DAY ?  ?nitroGLYCERIN 0.4 MG SL tablet ?Commonly known as: NITROSTAT ?Place 1 tablet (0.4 mg total) under the tongue every 5 (five) minutes x 3 doses as needed for chest pain. ?  ?predniSONE 20 MG tablet ?Commonly known as: DELTASONE ?Take 2 tablets (40 mg total) by mouth  daily with breakfast. ?  ?promethazine-dextromethorphan 6.25-15 MG/5ML syrup ?Commonly known as: PROMETHAZINE-DM ?Take 5 mLs by mouth 4 (four) times daily as needed. ?  ?tamsulosin 0.4 MG Caps capsule ?Commonly known as: FLOMAX ?Take 1 capsule (0.4 mg total) by mouth daily after supper. ?  ? ?  ? ? ?Allergies: No Known Allergies ? ?Family History: ?Family History  ?Problem Relation Age of Onset  ? Heart disease Father   ?     CABG age 35's  ? ? ?Social History:  reports that he quit smoking about 13 years ago. His smoking use included cigarettes. His smokeless tobacco use includes snuff. He reports current alcohol use. He  reports that he does not currently use drugs. ? ?ROS: ?All other review of systems were reviewed and are negative except what is noted above in HPI ? ?Physical Exam: ?BP (!) 148/94   Pulse 77   ?Constitutional:  Alert and oriented, No acute distress. ?HEENT: [place] AT, moist mucus membranes.  Trachea midline, no masses. ?Cardiovascular: No clubbing, cyanosis, or edema. ?Respiratory: Normal respiratory effort, no increased work of breathing. ?GI: Abdomen is soft, nontender, nondistended, no abdominal masses ?GU: No CVA tenderness.  ?Lymph: No cervical or inguinal lymphadenopathy. ?Skin: No rashes, bruises or suspicious lesions. ?Neurologic: Grossly intact, no focal deficits, moving all 4 extremities. ?Psychiatric: Normal mood and affect. ? ?Laboratory Data: ?Lab Results  ?Component Value Date  ? WBC 6.7 07/14/2020  ? HGB 14.4 07/14/2020  ? HCT 42.5 07/14/2020  ? MCV 89.3 07/14/2020  ? PLT 223 07/14/2020  ? ? ?Lab Results  ?Component Value Date  ? CREATININE 1.37 (H) 12/30/2020  ? ? ?No results found for: PSA ? ?No results found for: TESTOSTERONE ? ?Lab Results  ?Component Value Date  ? HGBA1C 5.9 (H) 07/18/2019  ? ? ?Urinalysis ?   ?Component Value Date/Time  ? [place] YELLOW 05/21/2008 1856  ? APPEARANCEUR Clear 04/05/2021 0941  ? LABSPEC >1.030 (H) 05/21/2008 3149  ? PHURINE 5.0 05/21/2008 0638  ? GLUCOSEU Negative 04/05/2021 0941  ? HGBUR LARGE (A) 05/21/2008 7026  ? BILIRUBINUR Negative 04/05/2021 0941  ? KETONESUR negative 06/23/2020 0937  ? [place] NEGATIVE 05/21/2008 3785  ? PROTEINUR 1+ (A) 04/05/2021 0941  ? [place] NEGATIVE 05/21/2008 8850  ? UROBILINOGEN 0.2 06/23/2020 0937  ? UROBILINOGEN 0.2 05/21/2008 0638  ? NITRITE Negative 04/05/2021 0941  ? NITRITE NEGATIVE 05/21/2008 0638  ? LEUKOCYTESUR Negative 04/05/2021 0941  ? ? ?Lab Results  ?Component Value Date  ? LABMICR See below: 04/05/2021  ? [place] None seen 04/05/2021  ? LABEPIT None seen 04/05/2021  ? MUCUS Present 04/05/2021  ? BACTERIA None seen  04/05/2021  ? ? ?Pertinent Imaging: ?KUB 04/05/2022: Images reviewed and discussed with the patient  ?Results for orders placed during the hospital encounter of 04/05/21 ? ?Abdomen 1 view (KUB) ? ?Narrative ?CLINICAL DATA:  History of nephrolithiasis and lithotripsy 8 months ?prior. Routine follow-up. No acute symptoms reported. ? ?EXAM: ?ABDOMEN - 1 VIEW ? ?COMPARISON:  09/03/2020 abdominal radiograph. ? ?FINDINGS: ?Calcified 11 mm and 6 mm stones overlying the interpolar left kidney ?and calcified 4 mm stone overlying the upper left kidney. A few tiny ?2-3 mm calcifications scattered over the right kidney. No radiopaque ?stones overlie the expected locations of the ureters or bladder. No ?dilated small bowel loops. Mild colonic stool. No evidence of ?pneumatosis or pneumoperitoneum. Mild lumbar spondylosis. ? ?IMPRESSION: ?Bilateral nephrolithiasis as detailed. ? ? ?Electronically Signed ?By: Ilona Sorrel M.D. ?On: 04/05/2021 14:17 ? ?No  results found for this or any previous visit. ? ?No results found for this or any previous visit. ? ?No results found for this or any previous visit. ? ?Results for orders placed during the hospital encounter of 09/16/20 ? ?Ultrasound renal complete ? ?Narrative ?CLINICAL DATA:  Initial evaluation for nephrolithiasis. History of ?recent lithotripsy on 07/21/2020. Small ? ?EXAM: ?RENAL / URINARY TRACT ULTRASOUND COMPLETE ? ?COMPARISON:  Prior CT from 06/29/2020. ? ?FINDINGS: ?Right Kidney: ? ?Renal measurements: 13.4 x 5.5 x 6.3 cm = volume: 240.6 mL. Renal ?echogenicity within normal limits. 4 mm nonobstructive calculus ?present at the interpolar region. No other definite calculi visible ?by ultrasound. No hydronephrosis. No focal renal mass. ? ?Left Kidney: ? ?Renal measurements: 12.5 x 6.2 x 6.8 cm = volume: 279.2 mL. Renal ?echogenicity within normal limits. 11 mm nonobstructive calculus ?present at the mid-lower pole. Few additional probable scattered ?nonobstructive calculi  suspected as well. No hydronephrosis. No ?focal renal mass. ? ?Bladder: ? ?Appears normal for degree of bladder distention. ? ?Other: ? ?None. ? ?IMPRESSION: ?Bilateral nonobstructive nephrolithiasis measuring

## 2022-04-12 ENCOUNTER — Encounter: Payer: Self-pay | Admitting: Urology

## 2022-04-12 NOTE — Patient Instructions (Signed)
Dietary Guidelines to Help Prevent Kidney Stones Kidney stones are deposits of minerals and salts that form inside your kidneys. Your risk of developing kidney stones may be greater depending on your diet, your lifestyle, the medicines you take, and whether you have certain medical conditions. Most people can lower their chances of developing kidney stones by following the instructions below. Your dietitian may give you more specific instructions depending on your overall health and the type of kidney stones you tend to develop. What are tips for following this plan? Reading food labels  Choose foods with "no salt added" or "low-salt" labels. Limit your salt (sodium) intake to less than 1,500 mg a day. Choose foods with calcium for each meal and snack. Try to eat about 300 mg of calcium at each meal. Foods that contain 200-500 mg of calcium a serving include: 8 oz (237 mL) of milk, calcium-fortifiednon-dairy milk, and calcium-fortifiedfruit juice. Calcium-fortified means that calcium has been added to these drinks. 8 oz (237 mL) of kefir, yogurt, and soy yogurt. 4 oz (114 g) of tofu. 1 oz (28 g) of cheese. 1 cup (150 g) of dried figs. 1 cup (91 g) of cooked broccoli. One 3 oz (85 g) can of sardines or mackerel. Most people need 1,000-1,500 mg of calcium a day. Talk to your dietitian about how much calcium is recommended for you. Shopping Buy plenty of fresh fruits and vegetables. Most people do not need to avoid fruits and vegetables, even if these foods contain nutrients that may contribute to kidney stones. When shopping for convenience foods, choose: Whole pieces of fruit. Pre-made salads with dressing on the side. Low-fat fruit and yogurt smoothies. Avoid buying frozen meals or prepared deli foods. These can be high in sodium. Look for foods with live cultures, such as yogurt and kefir. Choose high-fiber grains, such as whole-wheat breads, oat bran, and wheat cereals. Cooking Do not add  salt to food when cooking. Place a salt shaker on the table and allow each person to add his or her own salt to taste. Use vegetable protein, such as beans, textured vegetable protein (TVP), or tofu, instead of meat in pasta, casseroles, and soups. Meal planning Eat less salt, if told by your dietitian. To do this: Avoid eating processed or pre-made food. Avoid eating fast food. Eat less animal protein, including cheese, meat, poultry, or fish, if told by your dietitian. To do this: Limit the number of times you have meat, poultry, fish, or cheese each week. Eat a diet free of meat at least 2 days a week. Eat only one serving each day of meat, poultry, fish, or seafood. When you prepare animal protein, cut pieces into small portion sizes. For most meat and fish, one serving is about the size of the palm of your hand. Eat at least five servings of fresh fruits and vegetables each day. To do this: Keep fruits and vegetables on hand for snacks. Eat one piece of fruit or a handful of berries with breakfast. Have a salad and fruit at lunch. Have two kinds of vegetables at dinner. Limit foods that are high in a substance called oxalate. These include: Spinach (cooked), rhubarb, beets, sweet potatoes, and Swiss chard. Peanuts. Potato chips, french fries, and baked potatoes with skin on. Nuts and nut products. Chocolate. If you regularly take a diuretic medicine, make sure to eat at least 1 or 2 servings of fruits or vegetables that are high in potassium each day. These include: Avocado. Banana. Orange, prune,   carrot, or tomato juice. Baked potato. Cabbage. Beans and split peas. Lifestyle  Drink enough fluid to keep your urine pale yellow. This is the most important thing you can do. Spread your fluid intake throughout the day. If you drink alcohol: Limit how much you use to: 0-1 drink a day for women who are not pregnant. 0-2 drinks a day for men. Be aware of how much alcohol is in your  drink. In the [place] one drink equals one 12 oz bottle of beer (355 mL), one 5 oz glass of wine (148 mL), or one 1 oz glass of hard liquor (44 mL). Lose weight if told by your health care provider. Work with your dietitian to find an eating plan and weight loss strategies that work best for you. General information Talk to your health care provider and dietitian about taking daily supplements. You may be told the following depending on your health and the cause of your kidney stones: Not to take supplements with vitamin C. To take a calcium supplement. To take a daily probiotic supplement. To take other supplements such as magnesium, fish oil, or vitamin B6. Take over-the-counter and prescription medicines only as told by your health care provider. These include supplements. What foods should I limit? Limit your intake of the following foods, or eat them as told by your dietitian. Vegetables Spinach. Rhubarb. Beets. Canned vegetables. Pickles. Olives. Baked potatoes with skin. Grains Wheat bran. Baked goods. Salted crackers. Cereals high in sugar. Meats and other proteins Nuts. Nut butters. Large portions of meat, poultry, or fish. Salted, precooked, or cured meats, such as sausages, meat loaves, and hot dogs. Dairy Cheese. Beverages Regular soft drinks. Regular vegetable juice. Seasonings and condiments Seasoning blends with salt. Salad dressings. Soy sauce. Ketchup. Barbecue sauce. Other foods Canned soups. Canned pasta sauce. Casseroles. Pizza. Lasagna. Frozen meals. Potato chips. French fries. The items listed above may not be a complete list of foods and beverages you should limit. Contact a dietitian for more information. What foods should I avoid? Talk to your dietitian about specific foods you should avoid based on the type of kidney stones you have and your overall health. Fruits Grapefruit. The item listed above may not be a complete list of foods and beverages you should  avoid. Contact a dietitian for more information. Summary Kidney stones are deposits of minerals and salts that form inside your kidneys. You can lower your risk of kidney stones by making changes to your diet. The most important thing you can do is drink enough fluid. Drink enough fluid to keep your urine pale yellow. Talk to your dietitian about how much calcium you should have each day, and eat less salt and animal protein as told by your dietitian. This information is not intended to replace advice given to you by your health care provider. Make sure you discuss any questions you have with your health care provider. Document Revised: 08/09/2021 Document Reviewed: 08/09/2021 Elsevier Patient Education  2023 [employer]  

## 2022-04-27 IMAGING — DX DG ABDOMEN 1V
2 series · 2 of 2 positions shown · non-contrast
Comparison: Abdominal radiograph 07/21/2020.  CT 06/29/2020

CLINICAL DATA: Nephrolithiasis.

EXAM:
ABDOMEN - 1 VIEW

[abdomen kub (1 of 2)]
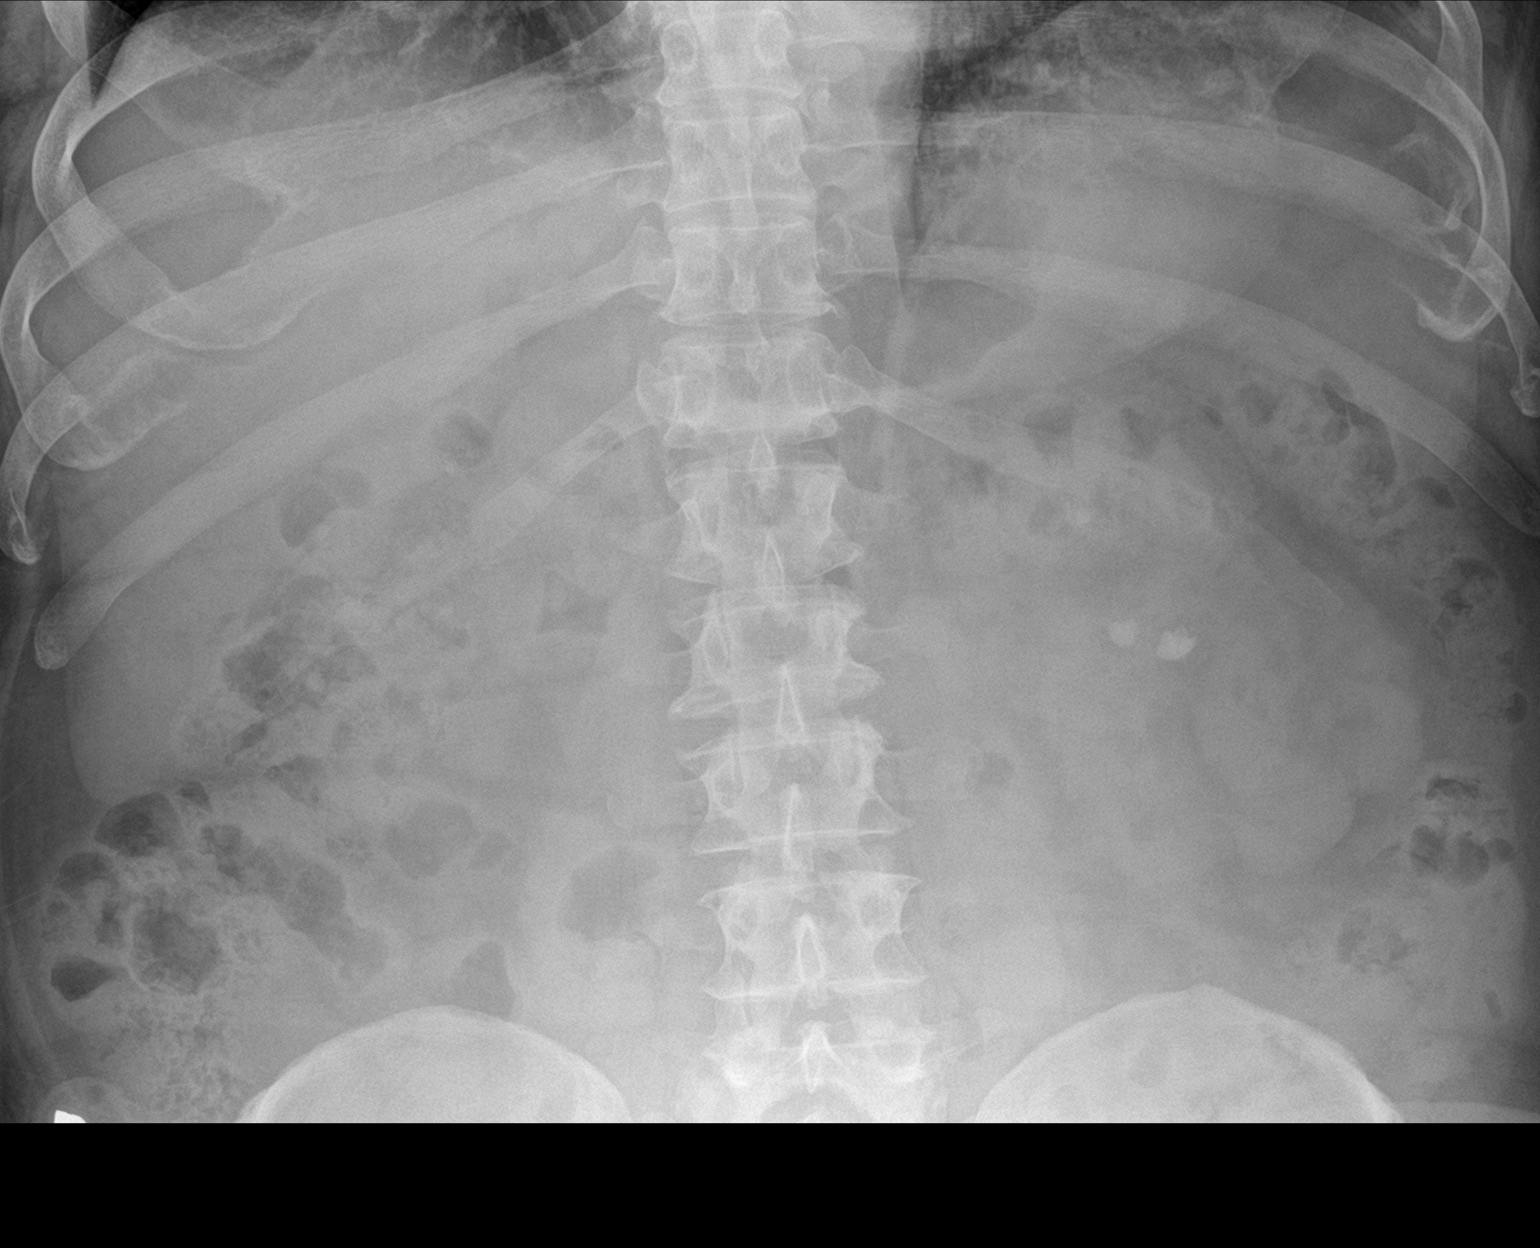

[abdomen kub (2 of 2)]
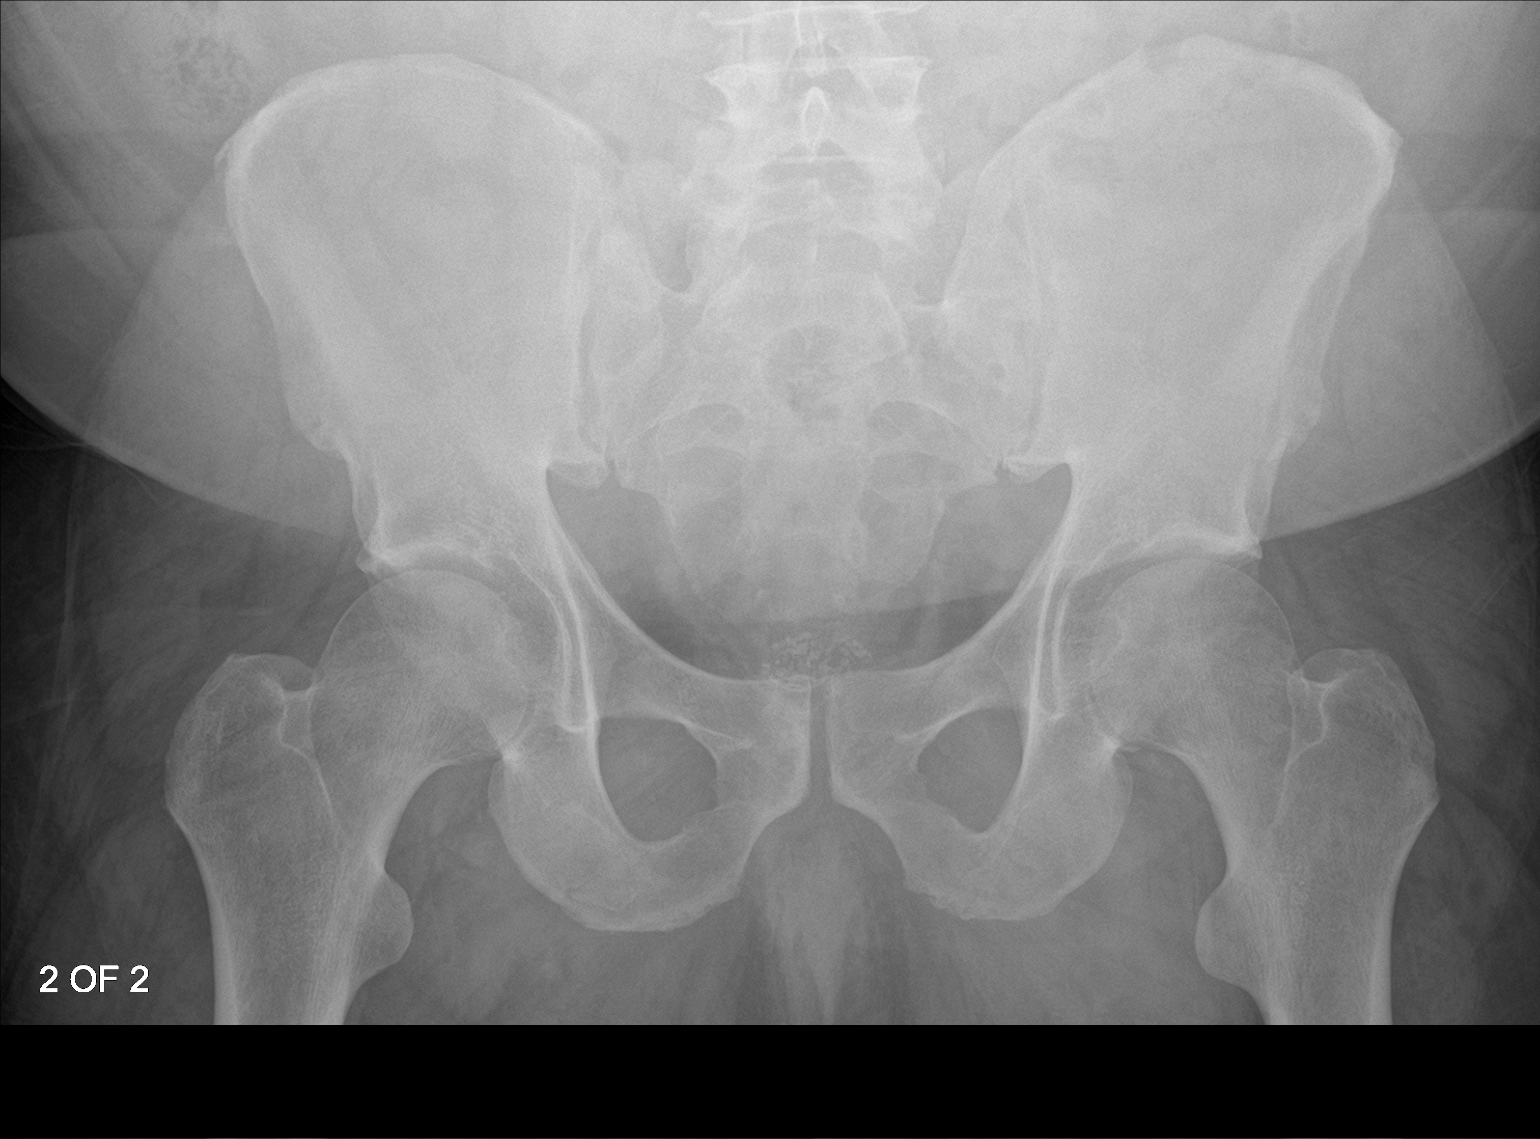

[2 of 2 positions shown; findings below may reference images not displayed]

FINDINGS: Vague density to the right of the sacrum at site of prior stone, not
is calcified as on prior exam. Intrarenal stones in the left kidney
are again seen, unchanged. Known right renal calculi are obscured by
overlying bowel gas. There are central prostatic calcifications.
Normal bowel gas pattern. Moderate colonic stool burden. No acute
osseous abnormalities are seen.
IMPRESSION: 1. Vague density to the right of the sacrum at site of prior stone,
difficult to determine if this is residual stone or overlying soft
tissue structures.
2. Intrarenal stones in the left kidney, unchanged. Known right
renal calculi are partially obscured by overlying bowel gas.

## 2022-04-28 ENCOUNTER — Other Ambulatory Visit: Payer: Self-pay | Admitting: Cardiology

## 2022-05-15 ENCOUNTER — Other Ambulatory Visit: Payer: Self-pay | Admitting: Cardiology

## 2022-05-21 ENCOUNTER — Other Ambulatory Visit: Payer: Self-pay | Admitting: Cardiology

## 2022-07-11 ENCOUNTER — Other Ambulatory Visit: Payer: Self-pay | Admitting: Cardiology

## 2022-08-26 ENCOUNTER — Encounter: Payer: Self-pay | Admitting: Student

## 2022-08-26 ENCOUNTER — Ambulatory Visit: Payer: BC Managed Care – PPO | Attending: Student | Admitting: Student

## 2022-08-26 VITALS — BP 134/82 | HR 86 | Ht 68.0 in | Wt 252.0 lb

## 2022-08-26 DIAGNOSIS — I2510 Atherosclerotic heart disease of native coronary artery without angina pectoris: Secondary | ICD-10-CM | POA: Diagnosis not present

## 2022-08-26 DIAGNOSIS — I10 Essential (primary) hypertension: Secondary | ICD-10-CM | POA: Diagnosis not present

## 2022-08-26 DIAGNOSIS — Z131 Encounter for screening for diabetes mellitus: Secondary | ICD-10-CM

## 2022-08-26 DIAGNOSIS — E785 Hyperlipidemia, unspecified: Secondary | ICD-10-CM | POA: Diagnosis not present

## 2022-08-26 MED ORDER — NITROGLYCERIN 0.4 MG SL SUBL: 0.4000 mg | SUBLINGUAL_TABLET | SUBLINGUAL | 3 refills | Status: DC | PRN

## 2022-08-26 MED ORDER — ATORVASTATIN CALCIUM 80 MG PO TABS: 80.0000 mg | ORAL_TABLET | Freq: Every day | ORAL | 3 refills | Status: DC

## 2022-08-26 MED ORDER — CARVEDILOL 3.125 MG PO TABS: 3.1250 mg | ORAL_TABLET | Freq: Two times a day (BID) | ORAL | 3 refills | Status: DC

## 2022-08-26 NOTE — Progress Notes (Signed)
Cardiology Office Note    Date:  08/26/2022   ID:  [name], DOB September 27, 1961, MRN [id number]  PCP:  Kathyrn Drown, MD  Cardiologist: Rozann Lesches, MD    Chief Complaint  Patient presents with   Follow-up    Annual Visit    History of Present Illness:    Jeremy Riley is a 61 y.o. male with past medical history of CAD (s/p DES to distal LCx in 05/2019, NST in 12/2020 showing mild peri-infarct ischemia), HTN, HLD, chronic back pain and prior tobacco abuse who presents to the office today for overdue annual follow-up.  He was last examined by Dr. Domenic Polite in 05/2021 and reported overall doing well at that time and denied any recent anginal symptoms. He was continued on his current cardiac medications including ASA 81 mg daily, Atorvastatin 80 mg daily, Coreg 3.125 mg twice daily and Losartan-HCTZ 100-12.5 mg daily.  In talking with the patient today, he reports overall doing well from a cardiac perspective since his last office visit. He continues to work several days a week and also enjoys playing golf 3-4 times per week. He denies any recent chest pain or dyspnea on exertion. Does have chronic back pain which limits his activity at times. No recent orthopnea, PND or pitting edema. He does check his blood pressure routinely at home and says this has been well controlled with SBP in the 110's to 130's. He recently started following a low-carb diet.    Past Medical History:  Diagnosis Date   Coronary artery disease    a. LHC 05/22/2019: 99% dCx s/p DES, luminal irregularities in LAD and dominant RCA   Essential hypertension    History of pneumonia as a child    Hyperlipidemia    Lumbar disc disease    Fall from ladder   Lumbar pain 05/18/2020   Patient with known history of sciatica.  On multiple medicines for his heart cannot take anti-inflammatories, intermittent use of tramadol prescribed   Nephrolithiasis    Pre-diabetes    Hgb A1c 6.3 in 05/2019.    Past Surgical  History:  Procedure Laterality Date   CORONARY STENT INTERVENTION N/A 05/22/2019   Procedure: CORONARY STENT INTERVENTION;  Surgeon: Belva Crome, MD;  Location: [facility];  Service: Cardiovascular;  Laterality: N/A;   EXTRACORPOREAL SHOCK WAVE LITHOTRIPSY Right 07/21/2020   Procedure: EXTRACORPOREAL SHOCK WAVE LITHOTRIPSY (ESWL);  Surgeon: Cleon Gustin, MD;  Location: [facility];  Service: Urology;  Laterality: Right;   LEFT HEART CATH AND CORONARY ANGIOGRAPHY N/A 05/22/2019   Procedure: LEFT HEART CATH AND CORONARY ANGIOGRAPHY;  Surgeon: Belva Crome, MD;  Location: [facility];  Service: Cardiovascular;  Laterality: N/A;    Current Medications: Outpatient Medications Prior to Visit  Medication Sig Dispense Refill   acetaminophen (TYLENOL) 500 MG tablet Take 500 mg by mouth every 6 (six) hours as needed for mild pain or fever.      albuterol (VENTOLIN HFA) 108 (90 Base) MCG/ACT inhaler TAKE 2 PUFFS BY MOUTH EVERY 6 HOURS AS NEEDED FOR WHEEZE OR SHORTNESS OF BREATH 6.7 each 4   albuterol (VENTOLIN HFA) 108 (90 Base) MCG/ACT inhaler Inhale 2 puffs into the lungs every 6 (six) hours as needed for wheezing or shortness of breath. 18 g 0   aspirin 81 MG chewable tablet Chew 1 tablet (81 mg total) by mouth daily.     losartan-hydrochlorothiazide (HYZAAR) 100-12.5 MG tablet TAKE 1 TABLET BY MOUTH EVERY  DAY 90 tablet 3   promethazine-dextromethorphan (PROMETHAZINE-DM) 6.25-15 MG/5ML syrup Take 5 mLs by mouth 4 (four) times daily as needed. 100 mL 0   tamsulosin (FLOMAX) 0.4 MG CAPS capsule Take 1 capsule (0.4 mg total) by mouth daily after supper. 90 capsule 3   atorvastatin (LIPITOR) 80 MG tablet *NEED APPOINTMENT*TAKE 1 TABLET BY MOUTH DAILY AT 6 PM. 30 tablet 6   carvedilol (COREG) 3.125 MG tablet TAKE 1 TABLET (3.125 MG TOTAL) BY MOUTH 2 (TWO) TIMES DAILY WITH A MEAL 180 tablet 3   nitroGLYCERIN (NITROSTAT) 0.4 MG SL tablet Place 1 tablet (0.4 mg total) under the tongue every  5 (five) minutes x 3 doses as needed for chest pain. 25 tablet 3   azithromycin (ZITHROMAX) 250 MG tablet Take first 2 tablets together, then 1 every day until finished. (Patient not taking: Reported on 08/26/2022) 6 tablet 0   oxyCODONE-acetaminophen (PERCOCET) 5-325 MG tablet Take 1 tablet by mouth every 4 (four) hours as needed. (Patient not taking: Reported on 08/26/2022) 30 tablet 0   predniSONE (DELTASONE) 20 MG tablet Take 2 tablets (40 mg total) by mouth daily with breakfast. (Patient not taking: Reported on 08/26/2022) 10 tablet 0   No facility-administered medications prior to visit.     Allergies:   Patient has no known allergies.   Social History   Socioeconomic History   Marital status: Married    Spouse name: Not on file   Number of children: 3   Years of education: Not on file   Highest education level: Not on file  Occupational History   Not on file  Tobacco Use   Smoking status: Former    Years: 30.00    Types: Cigarettes    Quit date: 01/08/2009    Years since quitting: 13.6   Smokeless tobacco: Current    Types: Snuff  Vaping Use   Vaping Use: Every day   Substances: Nicotine, Flavoring  Substance and Sexual Activity   Alcohol use: Yes    Comment: occ   Drug use: Not Currently   Sexual activity: Not on file  Other Topics Concern   Not on file  Social History Narrative   Not on file   Social Determinants of Health   Financial Resource Strain: Not on file  Food Insecurity: Not on file  Transportation Needs: Not on file  Physical Activity: Not on file  Stress: Not on file  Social Connections: Not on file     Family History:  The patient's family history includes Heart disease in his father.   Review of Systems:    Please see the history of present illness.     All other systems reviewed and are otherwise negative except as noted above.   Physical Exam:    VS:  BP 134/82   Pulse 86   Ht '5\' 8"'$  (1.727 m)   Wt 252 lb (114.3 kg)   SpO2 92%    BMI 38.32 kg/m    General: Well developed, well nourished,male appearing in no acute distress. Head: Normocephalic, atraumatic. Neck: No carotid bruits. JVD not elevated.  Lungs: Respirations regular and unlabored, without wheezes or rales.  Heart: Regular rate and rhythm. No S3 or S4.  No murmur, no rubs, or gallops appreciated. Abdomen: Appears non-distended. No obvious abdominal masses. Msk:  Strength and tone appear normal for age. No obvious joint deformities or effusions. Extremities: No clubbing or cyanosis. No pitting edema.  Distal pedal pulses are 2+ bilaterally. Neuro: Alert and oriented  X 3. Moves all extremities spontaneously. No focal deficits noted. Psych:  Responds to questions appropriately with a normal affect. Skin: No rashes or lesions noted  Wt Readings from Last 3 Encounters:  08/26/22 252 lb (114.3 kg)  06/08/21 252 lb (114.3 kg)  04/05/21 240 lb (108.9 kg)     Studies/Labs Reviewed:   EKG:  EKG is ordered today. The ekg ordered today demonstrates NSR, HR 86 with no acute ST changes from baseline.   Recent Labs: No results found for requested labs within last 365 days.   Lipid Panel    Component Value Date/Time   CHOL 114 05/21/2020 0819   TRIG 106 05/21/2020 0819   HDL 38 (L) 05/21/2020 0819   CHOLHDL 3.0 05/21/2020 0819   CHOLHDL 3.2 07/18/2019 0839   VLDL 22 07/18/2019 0839   LDLCALC 56 05/21/2020 0819    Additional studies/ records that were reviewed today include:   LHC: 05/2019 Unstable angina pectoris Successful DES implantation in distal circumflex reducing a culprit 99% stenosis to 0% using a 2.5 x 18 Onyx postdilated to 2.75 mm.  TIMI grade III flow noted. Normal left main. Luminal irregularities in LAD Luminal irregularities and dominant right coronary   RECOMMENDATIONS:   Aggressive secondary risk factor modification: Consider the diagnosis of sleep apnea; hemoglobin A1c should be less than 7; LDL cholesterol less than 70 and  preferably 55 or less; blood pressure 130/80 mmHg or less; moderate aerobic activity; discontinue vaping. Eligible for discharge in a.m. if no problems.  NST: 12/2020 No diagnostic ST segment changes to indicate ischemia. Medium sized, moderate intensity, apical to basal inferior defect with partial reversibility involving the apical inferior septal wall, also partial reversibility in the basal inferolateral wall. This is consistent with scar and mild peri-infarct ischemia. This is a low to intermediate risk study. Nuclear stress EF: 64%.  Assessment:    1. Coronary artery disease involving native coronary artery of native heart without angina pectoris   2. Essential hypertension   3. Hyperlipidemia LDL goal <70   4. Screening for diabetes mellitus      Plan:   In order of problems listed above:  1. CAD - He is s/p DES to distal LCx in 05/2019 with NST in 12/2020 showing mild peri-infarct ischemia and medical management was pursued.  - He remains active at baseline and denies any recent anginal symptoms. - Continue current medical therapy with ASA 81 mg daily, Atorvastatin 80 mg daily and Coreg 3.125 mg twice daily.  2. HTN - BP is at 134/82 during today's visit. Continue current medical therapy with Coreg 3.'125mg'$  BID and Losartan-HCTZ 100-12.'5mg'$  daily. Will recheck a BMET for reassessment of his electrolytes and kidney function.  3. HLD - No recent FLP on file (LDL was previously at 56 in 05/2020). Will recheck an FLP and LFT's. He remains on Atorvastatin 80 mg daily.  4. Screening for Diabetes - He has not had routine labs in over 1.5 years. Will recheck a Hgb A1c with upcoming blood work.   He wishes to follow-up in 1 year and he will reach out with any new symptoms in the interim which would warrant sooner evaluation.   Medication Adjustments/Labs and Tests Ordered: Current medicines are reviewed at length with the patient today.  Concerns regarding medicines are outlined  above.  Medication changes, Labs and Tests ordered today are listed in the Patient Instructions below. Patient Instructions  Medication Instructions:  Your physician recommends that you continue on your current medications  as directed. Please refer to the Current Medication list given to you today.  *If you need a refill on your cardiac medications before your next appointment, please call your pharmacy*   Lab Work: Your physician recommends that you return for lab work in: Fasting   If you have labs (blood work) drawn today and your tests are completely normal, you will receive your results only by: [place] (if you have [place]) OR A paper copy in the mail If you have any lab test that is abnormal or we need to change your treatment, we will call you to review the results.   Testing/Procedures: NONE    Follow-Up: At [facility], you and your health needs are our priority.  As part of our continuing mission to provide you with exceptional heart care, we have created designated Provider Care Teams.  These Care Teams include your primary Cardiologist (physician) and Advanced Practice Providers (APPs -  Physician Assistants and Nurse Practitioners) who all work together to provide you with the care you need, when you need it.  We recommend signing up for the patient portal called "MyChart".  Sign up information is provided on this After Visit Summary.  MyChart is used to connect with patients for Virtual Visits (Telemedicine).  Patients are able to view lab/test results, encounter notes, upcoming appointments, etc.  Non-urgent messages can be sent to your provider as well.   To learn more about what you can do with MyChart, go to [email or web address].    Your next appointment:   1 year(s)  The format for your next appointment:   In Person  Provider:   Rozann Lesches, MD    Other Instructions Thank you for choosing [facility]!    Important  Information About Sugar         Signed, Erma Heritage, PA-C  08/26/2022 3:37 PM    [facility] [address] [place], [place] [address] Phone: [phone] Fax: [phone]

## 2022-08-26 NOTE — Patient Instructions (Signed)
Medication Instructions:  Your physician recommends that you continue on your current medications as directed. Please refer to the Current Medication list given to you today.  *If you need a refill on your cardiac medications before your next appointment, please call your pharmacy*   Lab Work: Your physician recommends that you return for lab work in: Fasting   If you have labs (blood work) drawn today and your tests are completely normal, you will receive your results only by: [place] (if you have [place]) OR A paper copy in the mail If you have any lab test that is abnormal or we need to change your treatment, we will call you to review the results.   Testing/Procedures: NONE    Follow-Up: At [facility], you and your health needs are our priority.  As part of our continuing mission to provide you with exceptional heart care, we have created designated Provider Care Teams.  These Care Teams include your primary Cardiologist (physician) and Advanced Practice Providers (APPs -  Physician Assistants and Nurse Practitioners) who all work together to provide you with the care you need, when you need it.  We recommend signing up for the patient portal called "MyChart".  Sign up information is provided on this After Visit Summary.  MyChart is used to connect with patients for Virtual Visits (Telemedicine).  Patients are able to view lab/test results, encounter notes, upcoming appointments, etc.  Non-urgent messages can be sent to your provider as well.   To learn more about what you can do with MyChart, go to [email or web address].    Your next appointment:   1 year(s)  The format for your next appointment:   In Person  Provider:   Rozann Lesches, MD    Other Instructions Thank you for choosing [facility]!    Important Information About Sugar

## 2022-08-27 ENCOUNTER — Telehealth: Payer: Self-pay | Admitting: Family Medicine

## 2022-08-27 DIAGNOSIS — Z125 Encounter for screening for malignant neoplasm of prostate: Secondary | ICD-10-CM

## 2022-08-27 NOTE — Telephone Encounter (Signed)
Staff   Please reach out to the patient.  I was doing chart review it has been well over a year since he has had a physical along with preventative measures.  It would be a good idea for him to schedule regular standard physical for later this fall

## 2022-08-29 NOTE — Telephone Encounter (Signed)
That is fine but he does need PSA ordered.  This could be ordered by [place] and he can get it drawn when getting drawn by cardiology or patient can wait till his office visit then we will order it

## 2022-08-29 NOTE — Telephone Encounter (Signed)
Patient has physical on 09/26/2022 he is having labs done at cardiologist on next week

## 2022-08-30 NOTE — Telephone Encounter (Signed)
Blood work ordered in [employer]. Patient notified and will have it added to cardiology labs.

## 2022-09-01 ENCOUNTER — Other Ambulatory Visit (HOSPITAL_COMMUNITY)
Admission: RE | Admit: 2022-09-01 | Discharge: 2022-09-01 | Disposition: A | Discharge status: Home / Self Care | Payer: BC Managed Care – PPO | Source: Ambulatory Visit | Attending: Student | Admitting: Student

## 2022-09-01 ENCOUNTER — Other Ambulatory Visit (HOSPITAL_COMMUNITY)
Admission: RE | Admit: 2022-09-01 | Discharge: 2022-09-01 | Disposition: A | Discharge status: Home / Self Care | Payer: BC Managed Care – PPO | Source: Ambulatory Visit | Attending: Family Medicine | Admitting: Family Medicine

## 2022-09-01 DIAGNOSIS — Z125 Encounter for screening for malignant neoplasm of prostate: Secondary | ICD-10-CM | POA: Insufficient documentation

## 2022-09-01 DIAGNOSIS — E785 Hyperlipidemia, unspecified: Secondary | ICD-10-CM | POA: Diagnosis not present

## 2022-09-01 DIAGNOSIS — I10 Essential (primary) hypertension: Secondary | ICD-10-CM | POA: Insufficient documentation

## 2022-09-01 DIAGNOSIS — I2510 Atherosclerotic heart disease of native coronary artery without angina pectoris: Secondary | ICD-10-CM | POA: Insufficient documentation

## 2022-09-01 DIAGNOSIS — Z131 Encounter for screening for diabetes mellitus: Secondary | ICD-10-CM | POA: Diagnosis not present

## 2022-09-01 LAB — CBC
HCT: 40.3 % (ref 39.0–52.0)
Hemoglobin: 14.6 g/dL (ref 13.0–17.0)
MCH: 31.3 pg (ref 26.0–34.0)
MCHC: 36.2 g/dL — ABNORMAL HIGH (ref 30.0–36.0)
MCV: 86.3 fL (ref 80.0–100.0)
Platelets: 214 10*3/uL (ref 150–400)
RBC: 4.67 MIL/uL (ref 4.22–5.81)
RDW: 12.6 % (ref 11.5–15.5)
WBC: 5.8 10*3/uL (ref 4.0–10.5)
nRBC: 0 % (ref 0.0–0.2)

## 2022-09-01 LAB — PSA: Prostatic Specific Antigen: 2.14 ng/mL (ref 0.00–4.00)

## 2022-09-01 LAB — COMPREHENSIVE METABOLIC PANEL
ALT: 28 U/L (ref 0–44)
AST: 26 U/L (ref 15–41)
Albumin: 4.5 g/dL (ref 3.5–5.0)
Alkaline Phosphatase: 47 U/L (ref 38–126)
Anion gap: 10 (ref 5–15)
BUN: 22 mg/dL (ref 8–23)
CO2: 23 mmol/L (ref 22–32)
Calcium: 9.3 mg/dL (ref 8.9–10.3)
Chloride: 106 mmol/L (ref 98–111)
Creatinine, Ser: 1.07 mg/dL (ref 0.61–1.24)
GFR, Estimated: >60 mL/min (ref >60)
Glucose, Bld: 106 mg/dL — ABNORMAL HIGH (ref 70–99)
Potassium: 3.4 mmol/L — ABNORMAL LOW (ref 3.5–5.1)
Sodium: 139 mmol/L (ref 135–145)
Total Bilirubin: 1.2 mg/dL (ref 0.3–1.2)
Total Protein: 7.3 g/dL (ref 6.5–8.1)

## 2022-09-01 LAB — LIPID PANEL
Cholesterol: 103 mg/dL (ref 0–200)
HDL: 31 mg/dL — ABNORMAL LOW (ref >40)
LDL Cholesterol: 57 mg/dL (ref 0–99)
Total CHOL/HDL Ratio: 3.3 RATIO
Triglycerides: 75 mg/dL (ref <150)
VLDL: 15 mg/dL (ref 0–40)

## 2022-09-01 LAB — HEMOGLOBIN A1C
Hgb A1c MFr Bld: 6 % — ABNORMAL HIGH (ref 4.8–5.6)
Mean Plasma Glucose: 125.5 mg/dL

## 2022-09-02 ENCOUNTER — Telehealth: Payer: Self-pay

## 2022-09-02 DIAGNOSIS — Z79899 Other long term (current) drug therapy: Secondary | ICD-10-CM

## 2022-09-02 NOTE — Telephone Encounter (Signed)
Results discussed with patient. He will repeat bmet in 1 month

## 2022-09-02 NOTE — Telephone Encounter (Signed)
-----   Message from Erma Heritage, [place] sent at 09/02/2022 12:02 PM EDT ----- Please let the patient know his hemoglobin and platelets are within a normal range.  Kidney function has actually improved when compared to prior values last year. His potassium is slightly low at 3.4 and would recommend increasing his intake of potassium rich foods such as bananas, tomatoes, greens, etc. This is likely due to HCTZ and if this remains low, he may need to be started on potassium supplementation. Would recheck a BMET in 1 month. His cholesterol remains well controlled with total cholesterol at 103 and LDL at 57. Hgb A1c at 6.0 shows he remains prediabetic and needs to limit intake of pasta, potatoes and bread.

## 2022-09-12 ENCOUNTER — Ambulatory Visit (INDEPENDENT_AMBULATORY_CARE_PROVIDER_SITE_OTHER): Payer: BC Managed Care – PPO | Admitting: Nurse Practitioner

## 2022-09-12 ENCOUNTER — Telehealth: Payer: Self-pay | Admitting: *Deleted

## 2022-09-12 ENCOUNTER — Telehealth: Payer: Self-pay

## 2022-09-12 ENCOUNTER — Encounter: Payer: Self-pay | Admitting: Nurse Practitioner

## 2022-09-12 DIAGNOSIS — U071 COVID-19: Secondary | ICD-10-CM | POA: Diagnosis not present

## 2022-09-12 MED ORDER — MOLNUPIRAVIR EUA 200MG CAPSULE: 4.0000 | ORAL_CAPSULE | Freq: Two times a day (BID) | ORAL | 0 refills | Status: AC

## 2022-09-12 NOTE — Telephone Encounter (Signed)
Caller name:Booker Ronnald Ramp   On DPR? :No   Call back number:[phone]  Provider they see: [place]   Reason for call:Tested positive today for Covid wants to know if paxlovid can be called in to [facility] in [place]

## 2022-09-12 NOTE — Progress Notes (Signed)
   Subjective:    Patient ID: Jeremy Riley, male    DOB: Jun 20, 1961, 61 y.o.   MRN: [id number]  HPI  Virtual Visit via Telephone Note  I connected with Jeremy Riley on 09/12/22 at  3:30 PM EDT by telephone and verified that I am speaking with the correct person using two identifiers.  Location: Patient: home Provider: office   I discussed the limitations, risks, security and privacy concerns of performing an evaluation and management service by telephone and the availability of in person appointments. I also discussed with the patient that there may be a patient responsible charge related to this service. The patient expressed understanding and agreed to proceed.   History of Present Illness:  61 year old male patient with history of HTN, CAD, unstable angina calls with cough, body aches, and head congestion that started yesterday. Patient tested positive for Covid this am. Patient reports no fevers, SOB, dyspnea or wheezing at this time and is interested in antiviral medication.    Observations/Objective: Spoke with patient via phone without difficulty. No signs of distress via the phone. Patient able to answer questions without difficulty.   Assessment and Plan: 1. COVID - Due to health history believe patient would benefit from antiviral medication. - molnupiravir EUA (LAGEVRIO) 200 mg CAPS capsule; Take 4 capsules (800 mg total) by mouth 2 (two) times daily for 5 days.  Dispense: 40 capsule; Refill: 0 -COVID is detected Under current CDC guidelines it is recommended to stay self isolated for at least 5 days.  If feeling well after 5 days may return to normal activities as long as you wears a mask for 5 days.  If you are not feeling well after 5 days you should stay under self-isolation for 10 days.  Warning signs to watch for if you develops chest tightness shortness of breath severe pain change in mental status you should seek further evaluation in the ER.  If further  questions or concerns please let us know   Follow Up Instructions:    I discussed the assessment and treatment plan with the patient. The patient was provided an opportunity to ask questions and all were answered. The patient agreed with the plan and demonstrated an understanding of the instructions.   The patient was advised to call back or seek an in-person evaluation if the symptoms worsen or if the condition fails to improve as anticipated.  I provided 15 minutes of non-face-to-face time during this encounter.

## 2022-09-12 NOTE — Telephone Encounter (Signed)
Mr. Jeremy Riley, Jeremy Riley are scheduled for a virtual visit with your provider today.    Just as we do with appointments in the office, we must obtain your consent to participate.  Your consent will be active for this visit and any virtual visit you may have with one of our providers in the next 365 days.    If you have a MyChart account, I can also send a copy of this consent to you electronically.  All virtual visits are billed to your insurance company just like a traditional visit in the office.  As this is a virtual visit, video technology does not allow for your provider to perform a traditional examination.  This may limit your provider's ability to fully assess your condition.  If your provider identifies any concerns that need to be evaluated in person or the need to arrange testing such as labs, EKG, etc, we will make arrangements to do so.    Although advances in technology are sophisticated, we cannot ensure that it will always work on either your end or our end.  If the connection with a video visit is poor, we may have to switch to a telephone visit.  With either a video or telephone visit, we are not always able to ensure that we have a secure connection.   I need to obtain your verbal consent now.   Are you willing to proceed with your visit today?   Jeremy Riley has provided verbal consent on 09/12/2022 for a virtual visit (video or telephone).

## 2022-09-12 NOTE — Telephone Encounter (Signed)
Pt contacted. Pt not having any issues breathing; just cough. Placed pt on [facility] schedule for this afternoon.

## 2022-09-26 ENCOUNTER — Ambulatory Visit (INDEPENDENT_AMBULATORY_CARE_PROVIDER_SITE_OTHER): Payer: BC Managed Care – PPO | Admitting: Family Medicine

## 2022-09-26 VITALS — BP 144/88 | HR 94 | Temp 98.2°F | Ht 68.0 in | Wt 244.0 lb

## 2022-09-26 DIAGNOSIS — Z1211 Encounter for screening for malignant neoplasm of colon: Secondary | ICD-10-CM | POA: Diagnosis not present

## 2022-09-26 DIAGNOSIS — Z0000 Encounter for general adult medical examination without abnormal findings: Secondary | ICD-10-CM

## 2022-09-26 DIAGNOSIS — I10 Essential (primary) hypertension: Secondary | ICD-10-CM

## 2022-09-26 DIAGNOSIS — R195 Other fecal abnormalities: Secondary | ICD-10-CM

## 2022-09-26 NOTE — Progress Notes (Signed)
   Subjective:    Patient ID: Jeremy Riley, male    DOB: May 24, 1961, 61 y.o.   MRN: [id number]  HPI The patient comes in today for a wellness visit. Patient has history of heart disease follows with cardiology He does try to eat healthy for the most part Tries to stay active Difficult time losing weight He does vape but he denies smoking    A review of their health history was completed.  A review of medications was also completed.  Any needed refills; no  Eating habits: fair  Falls/  MVA accidents in past few months: no  Regular exercise: some  Specialist pt sees on regular basis: cardiology, urology  Preventative health issues were discussed.   Additional concerns: had covid 17 days ago, when should he have the next covid vaccine/ rsv vaccine?   Review of Systems     Objective:   Physical Exam  General-in no acute distress Eyes-no discharge Lungs-respiratory rate normal, CTA CV-no murmurs,RRR Extremities skin warm dry no edema Neuro grossly normal Behavior normal, alert Prostate soft slightly enlarged      Assessment & Plan:  1. Screen for colon cancer Patient defers on colonoscopy - Cologuard  2. Well adult exam Adult wellness-complete.wellness physical was conducted today. Importance of diet and exercise were discussed in detail.  Importance of stress reduction and healthy living were discussed.  In addition to this a discussion regarding safety was also covered.  We also reviewed over immunizations and gave recommendations regarding current immunization needed for age.   In addition to this additional areas were also touched on including: Preventative health exams needed:  Colonoscopy patient chooses Cologuard  Patient was advised yearly wellness exam   3. Primary hypertension Blood pressure decent control but needs to be better patient will stop taking the cold medicine and will follow-up in the near future for recheck of blood  pressure

## 2022-09-26 NOTE — Patient Instructions (Signed)

## 2022-10-05 DIAGNOSIS — Z1211 Encounter for screening for malignant neoplasm of colon: Secondary | ICD-10-CM | POA: Diagnosis not present

## 2022-10-14 LAB — COLOGUARD: COLOGUARD: POSITIVE — AB

## 2022-10-14 NOTE — Addendum Note (Signed)
Addended by: Dairl Ponder on: 10/14/2022 03:43 PM   Modules accepted: Orders

## 2022-10-17 ENCOUNTER — Encounter: Payer: Self-pay | Admitting: Internal Medicine

## 2022-10-17 ENCOUNTER — Telehealth: Payer: Self-pay

## 2022-10-17 NOTE — Telephone Encounter (Signed)
Called and spoke with patient. Patient is coming up on our reports for the office but has never been seen here. Confirmed with patient that his PCP is Sallee Lange, MD at [facility]. Advised for patient to please reach out to his insurance company and ensure that they have the correct PCP listed for him. Patient verbalized understanding.

## 2022-10-27 ENCOUNTER — Encounter: Payer: Self-pay | Admitting: Family Medicine

## 2022-10-27 ENCOUNTER — Ambulatory Visit: Payer: BC Managed Care – PPO | Admitting: Family Medicine

## 2022-10-27 VITALS — BP 128/86 | HR 82 | Temp 98.1°F | Ht 68.0 in | Wt 246.0 lb

## 2022-10-27 DIAGNOSIS — E876 Hypokalemia: Secondary | ICD-10-CM

## 2022-10-27 DIAGNOSIS — I10 Essential (primary) hypertension: Secondary | ICD-10-CM

## 2022-10-27 DIAGNOSIS — R7303 Prediabetes: Secondary | ICD-10-CM

## 2022-10-27 NOTE — Progress Notes (Signed)
   Subjective:    Patient ID: Jeremy Riley, male    DOB: 1961/04/29, 61 y.o.   MRN: [id number]  HPI Patient for blood pressure check up.  The patient does have hypertension.   Patient relates dietary measures try to minimize salt The importance of healthy diet and activity were discussed Patient relates compliance  Patient here for follow-up regarding cholesterol.    Patient relates taking medication on a regular basis Denies problems with medication Importance of dietary measures discussed Regular lab work regarding lipid and liver was checked and if needing additional labs was appropriately ordered  Patient doing a good job taking his medicine blood pressure medicine Coreg atorvastatin aspirin and tamsulosin  Review of Systems     Objective:   Physical Exam  General-in no acute distress Eyes-no discharge Lungs-respiratory rate normal, CTA CV-no murmurs,RRR Extremities skin warm dry no edema Neuro grossly normal Behavior normal, alert       Assessment & Plan:   1. Primary hypertension Blood pressure good control continue current measures healthy diet  2. Pre-diabetes Minimize starches and sweets.  Fit in regular exercise.  Patient has been keeping weight down.  3. Hypokalemia Recent lab work looked good recommend follow-up lab work in the springtime Patient states he will consider getting flu shot and pneumococcal 20

## 2022-10-27 NOTE — Progress Notes (Signed)
   Subjective:    Patient ID: Jeremy Riley, male    DOB: February 26, 1961, 61 y.o.   MRN: [id number]  Hypertension This is a chronic problem. Treatments tried: hyzaar.      Review of Systems     Objective:   Physical Exam        Assessment & Plan:

## 2022-10-27 NOTE — Patient Instructions (Addendum)
Flu vaccine Pneumococcal20    Shingrix and shingles prevention: know the facts!   Shingrix is a very effective vaccine to prevent shingles.   Shingles is a reactivation of chickenpox -more than 99% of Americans born before 1980 have had chickenpox even if they do not remember it. One in every 10 people who get shingles have severe long-lasting nerve pain as a result.   33 out of a 100 older adults will get shingles if they are unvaccinated.     This vaccine is very important for your health This vaccine is indicated for anyone 50 years or older. You can get this vaccine even if you have already had shingles because you can get the disease more than once in a lifetime.  Your risk for shingles and its complications increases with age.  This vaccine has 2 doses.  The second dose would be 2 to 6 months after the first dose.  If you had Zostavax vaccine in the past you should still get Shingrix. ( Zostavax is only 70% effective and it loses significant strength over a few years .)  This vaccine is given through the pharmacy.  The cost of the vaccine is through your insurance. The pharmacy can inform you of the total costs.  Common side effects including soreness in the arm, some redness and swelling, also some feel fatigue muscle soreness headache low-grade fever.  Side effects typically go away within 2 to 3 days. Remember-the pain from shingles can last a lifetime but these side effects of the vaccine will only last a few days at most. It is very important to get both doses in order to protect yourself fully.   Please get this vaccine at your earliest convenience at your trusted pharmacy.

## 2022-11-17 ENCOUNTER — Encounter: Payer: Self-pay | Admitting: *Deleted

## 2022-11-17 ENCOUNTER — Ambulatory Visit: Payer: BC Managed Care – PPO | Admitting: Internal Medicine

## 2022-11-17 ENCOUNTER — Encounter: Payer: Self-pay | Admitting: Internal Medicine

## 2022-11-17 VITALS — BP 137/89 | HR 83 | Temp 97.2°F | Ht 68.0 in | Wt 242.0 lb

## 2022-11-17 DIAGNOSIS — R195 Other fecal abnormalities: Secondary | ICD-10-CM | POA: Diagnosis not present

## 2022-11-17 MED ORDER — CLENPIQ 10-3.5-12 MG-GM -GM/175ML PO SOLN: 1.0000 | ORAL | 0 refills | Status: DC

## 2022-11-17 NOTE — Patient Instructions (Signed)
We will schedule you for colonoscopy given your recent positive Cologuard testing.  Further recommendations to follow.  It was very nice meeting you today.  Dr. Abbey Chatters

## 2022-11-17 NOTE — H&P (View-Only) (Signed)
Primary Care Physician:  Kathyrn Drown, MD Primary Gastroenterologist:  Dr. Abbey Chatters  Chief Complaint  Patient presents with   New Patient (Initial Visit)    Referred for pos cologuard    HPI:   Jeremy Riley is a 61 y.o. male who presents to the clinic today by referral from his PCP Dr. Wolfgang Phoenix for evaluation.  Recent Cologuard test positive.  Patient denies any melena hematochezia.  No unintentional weight loss.  No abdominal pain.  No family history of colorectal malignancy.  No previous colonoscopy.  Denies any upper GI symptoms including heartburn, reflux, dysphagia/odynophagia, epigastric or chest pain.  History of CAD status post stent placement 2020, now just on baby aspirin.  Denies any chest pain.  Past Medical History:  Diagnosis Date   Coronary artery disease    a. LHC 05/22/2019: 99% dCx s/p DES, luminal irregularities in LAD and dominant RCA   Essential hypertension    History of pneumonia as a child    Hyperlipidemia    Lumbar disc disease    Fall from ladder   Lumbar pain 05/18/2020   Patient with known history of sciatica.  On multiple medicines for his heart cannot take anti-inflammatories, intermittent use of tramadol prescribed   Nephrolithiasis    Pre-diabetes    Hgb A1c 6.3 in 05/2019.    Past Surgical History:  Procedure Laterality Date   CORONARY STENT INTERVENTION N/A 05/22/2019   Procedure: CORONARY STENT INTERVENTION;  Surgeon: Belva Crome, MD;  Location: [place] [facility];  Service: Cardiovascular;  Laterality: N/A;   EXTRACORPOREAL SHOCK WAVE LITHOTRIPSY Right 07/21/2020   Procedure: EXTRACORPOREAL SHOCK WAVE LITHOTRIPSY (ESWL);  Surgeon: Cleon Gustin, MD;  Location: [facility];  Service: Urology;  Laterality: Right;   LEFT HEART CATH AND CORONARY ANGIOGRAPHY N/A 05/22/2019   Procedure: LEFT HEART CATH AND CORONARY ANGIOGRAPHY;  Surgeon: Belva Crome, MD;  Location: [place] [facility];  Service: Cardiovascular;  Laterality: N/A;     Current Outpatient Medications  Medication Sig Dispense Refill   acetaminophen (TYLENOL) 500 MG tablet Take 500 mg by mouth every 6 (six) hours as needed for mild pain or fever.      albuterol (VENTOLIN HFA) 108 (90 Base) MCG/ACT inhaler TAKE 2 PUFFS BY MOUTH EVERY 6 HOURS AS NEEDED FOR WHEEZE OR SHORTNESS OF BREATH 6.7 each 4   aspirin 81 MG chewable tablet Chew 1 tablet (81 mg total) by mouth daily.     atorvastatin (LIPITOR) 80 MG tablet Take 1 tablet (80 mg total) by mouth daily. 90 tablet 3   carvedilol (COREG) 3.125 MG tablet Take 1 tablet (3.125 mg total) by mouth 2 (two) times daily with a meal. 180 tablet 3   losartan-hydrochlorothiazide (HYZAAR) 100-12.5 MG tablet TAKE 1 TABLET BY MOUTH EVERY DAY 90 tablet 3   nitroGLYCERIN (NITROSTAT) 0.4 MG SL tablet Place 1 tablet (0.4 mg total) under the tongue every 5 (five) minutes x 3 doses as needed for chest pain. 25 tablet 3   tamsulosin (FLOMAX) 0.4 MG CAPS capsule Take 1 capsule (0.4 mg total) by mouth daily after supper. 90 capsule 3   No current facility-administered medications for this visit.    Allergies as of 11/17/2022   (No Known Allergies)    Family History  Problem Relation Age of Onset   Heart disease Father        CABG age 74's    Social History   Socioeconomic History   Marital status: Married  Spouse name: Not on file   Number of children: 3   Years of education: Not on file   Highest education level: Not on file  Occupational History   Not on file  Tobacco Use   Smoking status: Former    Years: 30.00    Types: Cigarettes    Quit date: 01/08/2009    Years since quitting: 13.8   Smokeless tobacco: Current    Types: Chew  Vaping Use   Vaping Use: Every day   Substances: Nicotine, Flavoring  Substance and Sexual Activity   Alcohol use: Yes    Comment: occ   Drug use: Not Currently   Sexual activity: Not on file  Other Topics Concern   Not on file  Social History Narrative   Not on file    Social Determinants of Health   Financial Resource Strain: Not on file  Food Insecurity: Not on file  Transportation Needs: Not on file  Physical Activity: Not on file  Stress: Not on file  Social Connections: Not on file  Intimate Partner Violence: Not on file    Subjective: Review of Systems  Constitutional:  Negative for chills and fever.  HENT:  Negative for congestion and hearing loss.   Eyes:  Negative for blurred vision and double vision.  Respiratory:  Negative for cough and shortness of breath.   Cardiovascular:  Negative for chest pain and palpitations.  Gastrointestinal:  Negative for abdominal pain, blood in stool, constipation, diarrhea, heartburn, melena and vomiting.  Genitourinary:  Negative for dysuria and urgency.  Musculoskeletal:  Negative for joint pain and myalgias.  Skin:  Negative for itching and rash.  Neurological:  Negative for dizziness and headaches.  Psychiatric/Behavioral:  Negative for depression. The patient is not nervous/anxious.        Objective: BP 137/89   Pulse 83   Temp (!) 97.2 F (36.2 C)   Ht '5\' 8"'$  (1.727 m)   Wt 242 lb (109.8 kg)   BMI 36.80 kg/m  Physical Exam Constitutional:      Appearance: Normal appearance.  HENT:     Head: Normocephalic and atraumatic.  Eyes:     Extraocular Movements: Extraocular movements intact.     Conjunctiva/sclera: Conjunctivae normal.  Cardiovascular:     Rate and Rhythm: Normal rate and regular rhythm.  Pulmonary:     Effort: Pulmonary effort is normal.     Breath sounds: Normal breath sounds.  Abdominal:     General: Bowel sounds are normal.     Palpations: Abdomen is soft.  Musculoskeletal:        General: Normal range of motion.     Cervical back: Normal range of motion and neck supple.  Skin:    General: Skin is warm.  Neurological:     General: No focal deficit present.     Mental Status: He is alert and oriented to person, place, and time.  Psychiatric:        Mood and  Affect: Mood normal.        Behavior: Behavior normal.      Assessment: *Positive Cologuard testing  Plan: Will schedule for screening colonoscopy.The risks including infection, bleed, or perforation as well as benefits, limitations, alternatives and imponderables have been reviewed with the patient. Questions have been answered. All parties agreeable.  Thank you Dr. Wolfgang Phoenix for the kind referral.  11/17/2022 1:55 PM   Disclaimer: This note was dictated with voice recognition software. Similar sounding words can inadvertently be transcribed and may  not be corrected upon review.

## 2022-11-17 NOTE — Progress Notes (Signed)
Primary Care Physician:  Kathyrn Drown, MD Primary Gastroenterologist:  Dr. Abbey Chatters  Chief Complaint  Patient presents with   New Patient (Initial Visit)    Referred for pos cologuard    HPI:   Jeremy Riley is a 61 y.o. male who presents to the clinic today by referral from his PCP Dr. Wolfgang Phoenix for evaluation.  Recent Cologuard test positive.  Patient denies any melena hematochezia.  No unintentional weight loss.  No abdominal pain.  No family history of colorectal malignancy.  No previous colonoscopy.  Denies any upper GI symptoms including heartburn, reflux, dysphagia/odynophagia, epigastric or chest pain.  History of CAD status post stent placement 2020, now just on baby aspirin.  Denies any chest pain.  Past Medical History:  Diagnosis Date   Coronary artery disease    a. LHC 05/22/2019: 99% dCx s/p DES, luminal irregularities in LAD and dominant RCA   Essential hypertension    History of pneumonia as a child    Hyperlipidemia    Lumbar disc disease    Fall from ladder   Lumbar pain 05/18/2020   Patient with known history of sciatica.  On multiple medicines for his heart cannot take anti-inflammatories, intermittent use of tramadol prescribed   Nephrolithiasis    Pre-diabetes    Hgb A1c 6.3 in 05/2019.    Past Surgical History:  Procedure Laterality Date   CORONARY STENT INTERVENTION N/A 05/22/2019   Procedure: CORONARY STENT INTERVENTION;  Surgeon: Belva Crome, MD;  Location: [place] [facility];  Service: Cardiovascular;  Laterality: N/A;   EXTRACORPOREAL SHOCK WAVE LITHOTRIPSY Right 07/21/2020   Procedure: EXTRACORPOREAL SHOCK WAVE LITHOTRIPSY (ESWL);  Surgeon: Cleon Gustin, MD;  Location: [facility];  Service: Urology;  Laterality: Right;   LEFT HEART CATH AND CORONARY ANGIOGRAPHY N/A 05/22/2019   Procedure: LEFT HEART CATH AND CORONARY ANGIOGRAPHY;  Surgeon: Belva Crome, MD;  Location: [place] [facility];  Service: Cardiovascular;  Laterality: N/A;     Current Outpatient Medications  Medication Sig Dispense Refill   acetaminophen (TYLENOL) 500 MG tablet Take 500 mg by mouth every 6 (six) hours as needed for mild pain or fever.      albuterol (VENTOLIN HFA) 108 (90 Base) MCG/ACT inhaler TAKE 2 PUFFS BY MOUTH EVERY 6 HOURS AS NEEDED FOR WHEEZE OR SHORTNESS OF BREATH 6.7 each 4   aspirin 81 MG chewable tablet Chew 1 tablet (81 mg total) by mouth daily.     atorvastatin (LIPITOR) 80 MG tablet Take 1 tablet (80 mg total) by mouth daily. 90 tablet 3   carvedilol (COREG) 3.125 MG tablet Take 1 tablet (3.125 mg total) by mouth 2 (two) times daily with a meal. 180 tablet 3   losartan-hydrochlorothiazide (HYZAAR) 100-12.5 MG tablet TAKE 1 TABLET BY MOUTH EVERY DAY 90 tablet 3   nitroGLYCERIN (NITROSTAT) 0.4 MG SL tablet Place 1 tablet (0.4 mg total) under the tongue every 5 (five) minutes x 3 doses as needed for chest pain. 25 tablet 3   tamsulosin (FLOMAX) 0.4 MG CAPS capsule Take 1 capsule (0.4 mg total) by mouth daily after supper. 90 capsule 3   No current facility-administered medications for this visit.    Allergies as of 11/17/2022   (No Known Allergies)    Family History  Problem Relation Age of Onset   Heart disease Father        CABG age 68's    Social History   Socioeconomic History   Marital status: Married  Spouse name: Not on file   Number of children: 3   Years of education: Not on file   Highest education level: Not on file  Occupational History   Not on file  Tobacco Use   Smoking status: Former    Years: 30.00    Types: Cigarettes    Quit date: 01/08/2009    Years since quitting: 13.8   Smokeless tobacco: Current    Types: Chew  Vaping Use   Vaping Use: Every day   Substances: Nicotine, Flavoring  Substance and Sexual Activity   Alcohol use: Yes    Comment: occ   Drug use: Not Currently   Sexual activity: Not on file  Other Topics Concern   Not on file  Social History Narrative   Not on file    Social Determinants of Health   Financial Resource Strain: Not on file  Food Insecurity: Not on file  Transportation Needs: Not on file  Physical Activity: Not on file  Stress: Not on file  Social Connections: Not on file  Intimate Partner Violence: Not on file    Subjective: Review of Systems  Constitutional:  Negative for chills and fever.  HENT:  Negative for congestion and hearing loss.   Eyes:  Negative for blurred vision and double vision.  Respiratory:  Negative for cough and shortness of breath.   Cardiovascular:  Negative for chest pain and palpitations.  Gastrointestinal:  Negative for abdominal pain, blood in stool, constipation, diarrhea, heartburn, melena and vomiting.  Genitourinary:  Negative for dysuria and urgency.  Musculoskeletal:  Negative for joint pain and myalgias.  Skin:  Negative for itching and rash.  Neurological:  Negative for dizziness and headaches.  Psychiatric/Behavioral:  Negative for depression. The patient is not nervous/anxious.        Objective: BP 137/89   Pulse 83   Temp (!) 97.2 F (36.2 C)   Ht '5\' 8"'$  (1.727 m)   Wt 242 lb (109.8 kg)   BMI 36.80 kg/m  Physical Exam Constitutional:      Appearance: Normal appearance.  HENT:     Head: Normocephalic and atraumatic.  Eyes:     Extraocular Movements: Extraocular movements intact.     Conjunctiva/sclera: Conjunctivae normal.  Cardiovascular:     Rate and Rhythm: Normal rate and regular rhythm.  Pulmonary:     Effort: Pulmonary effort is normal.     Breath sounds: Normal breath sounds.  Abdominal:     General: Bowel sounds are normal.     Palpations: Abdomen is soft.  Musculoskeletal:        General: Normal range of motion.     Cervical back: Normal range of motion and neck supple.  Skin:    General: Skin is warm.  Neurological:     General: No focal deficit present.     Mental Status: He is alert and oriented to person, place, and time.  Psychiatric:        Mood and  Affect: Mood normal.        Behavior: Behavior normal.      Assessment: *Positive Cologuard testing  Plan: Will schedule for screening colonoscopy.The risks including infection, bleed, or perforation as well as benefits, limitations, alternatives and imponderables have been reviewed with the patient. Questions have been answered. All parties agreeable.  Thank you Dr. Wolfgang Phoenix for the kind referral.  11/17/2022 1:55 PM   Disclaimer: This note was dictated with voice recognition software. Similar sounding words can inadvertently be transcribed and may  not be corrected upon review.

## 2022-11-18 ENCOUNTER — Telehealth: Payer: Self-pay | Admitting: *Deleted

## 2022-11-18 NOTE — Telephone Encounter (Signed)
Pt informed of pre-op date on 11/30/22 at 9:30 am

## 2022-11-29 NOTE — Patient Instructions (Signed)
Jeremy Riley  11/29/2022     '@PREFPERIOPPHARMACY'$ @   Your procedure is scheduled on  12/02/2022.   Report to [facility] at  0900  A.M.   Call this number if you have problems the morning of surgery:  [phone]  If you experience any cold or flu symptoms such as cough, fever, chills, shortness of breath, etc. between now and your scheduled surgery, please notify us at the above number.   Remember:  Follow the diet and prep instructions given to you by the office.    Take these medicines the morning of surgery with A SIP OF WATER                                     carvedilol.    Do not wear jewelry, make-up or nail polish.  Do not wear lotions, powders, or perfumes, or deodorant.  Do not shave 48 hours prior to surgery.  Men may shave face and neck.  Do not bring valuables to the hospital.  [facility] is not responsible for any belongings or valuables.  Contacts, dentures or bridgework may not be worn into surgery.  Leave your suitcase in the car.  After surgery it may be brought to your room.  For patients admitted to the hospital, discharge time will be determined by your treatment team.  Patients discharged the day of surgery will not be allowed to drive home and must have someone with them for 24 hours.   Special instructions:   DO NOT smoke tobacco or vape for 24 hours before your procedure.  Please read over the following fact sheets that you were given. Anesthesia Post-op Instructions and Care and Recovery After Surgery      Colonoscopy, Adult, Care After The following information offers guidance on how to care for yourself after your procedure. Your health care provider may also give you more specific instructions. If you have problems or questions, contact your health care provider. What can I expect after the procedure? After the procedure, it is common to have: A small amount of blood in your stool for 24 hours after the procedure. Some  gas. Mild cramping or bloating of your abdomen. Follow these instructions at home: Eating and drinking  Drink enough fluid to keep your urine pale yellow. Follow instructions from your health care provider about eating or drinking restrictions. Resume your normal diet as told by your health care provider. Avoid heavy or fried foods that are hard to digest. Activity Rest as told by your health care provider. Avoid sitting for a long time without moving. Get up to take short walks every 1-2 hours. This is important to improve blood flow and breathing. Ask for help if you feel weak or unsteady. Return to your normal activities as told by your health care provider. Ask your health care provider what activities are safe for you. Managing cramping and bloating  Try walking around when you have cramps or feel bloated. If directed, apply heat to your abdomen as told by your health care provider. Use the heat source that your health care provider recommends, such as a moist heat pack or a heating pad. Place a towel between your skin and the heat source. Leave the heat on for 20-30 minutes. Remove the heat if your skin turns bright red. This is especially important if you are unable to feel pain,  heat, or cold. You have a greater risk of getting burned. General instructions If you were given a sedative during the procedure, it can affect you for several hours. Do not drive or operate machinery until your health care provider says that it is safe. For the first 24 hours after the procedure: Do not sign important documents. Do not drink alcohol. Do your regular daily activities at a slower pace than normal. Eat soft foods that are easy to digest. Take over-the-counter and prescription medicines only as told by your health care provider. Keep all follow-up visits. This is important. Contact a health care provider if: You have blood in your stool 2-3 days after the procedure. Get help right away  if: You have more than a small spotting of blood in your stool. You have large blood clots in your stool. You have swelling of your abdomen. You have nausea or vomiting. You have a fever. You have increasing pain in your abdomen that is not relieved with medicine. These symptoms may be an emergency. Get help right away. Call 911. Do not wait to see if the symptoms will go away. Do not drive yourself to the hospital. Summary After the procedure, it is common to have a small amount of blood in your stool. You may also have mild cramping and bloating of your abdomen. If you were given a sedative during the procedure, it can affect you for several hours. Do not drive or operate machinery until your health care provider says that it is safe. Get help right away if you have a lot of blood in your stool, nausea or vomiting, a fever, or increased pain in your abdomen. This information is not intended to replace advice given to you by your health care provider. Make sure you discuss any questions you have with your health care provider. Document Revised: 07/21/2021 Document Reviewed: 07/21/2021 Elsevier Patient Education  [place] After The following information offers guidance on how to care for yourself after your procedure. Your health care provider may also give you more specific instructions. If you have problems or questions, contact your health care provider. What can I expect after the procedure? After the procedure, it is common to have: Tiredness. Little or no memory about what happened during or after the procedure. Impaired judgment when it comes to making decisions. Nausea or vomiting. Some trouble with balance. Follow these instructions at home: For the time period you were told by your health care provider:  Rest. Do not participate in activities where you could fall or become injured. Do not drive or use machinery. Do not drink  alcohol. Do not take sleeping pills or medicines that cause drowsiness. Do not make important decisions or sign legal documents. Do not take care of children on your own. Medicines Take over-the-counter and prescription medicines only as told by your health care provider. If you were prescribed antibiotics, take them as told by your health care provider. Do not stop using the antibiotic even if you start to feel better. Eating and drinking Follow instructions from your health care provider about what you may eat and drink. Drink enough fluid to keep your urine pale yellow. If you vomit: Drink clear fluids slowly and in small amounts as you are able. Clear fluids include water, ice chips, low-calorie sports drinks, and fruit juice that has water added to it (diluted fruit juice). Eat light and bland foods in small amounts as you are able. These foods  include bananas, applesauce, rice, lean meats, toast, and crackers. General instructions  Have a responsible adult stay with you for the time you are told. It is important to have someone help care for you until you are awake and alert. If you have sleep apnea, surgery and some medicines can increase your risk for breathing problems. Follow instructions from your health care provider about wearing your sleep device: When you are sleeping. This includes during daytime naps. While taking prescription pain medicines, sleeping medicines, or medicines that make you drowsy. Do not use any products that contain nicotine or tobacco. These products include cigarettes, chewing tobacco, and vaping devices, such as e-cigarettes. If you need help quitting, ask your health care provider. Contact a health care provider if: You feel nauseous or vomit every time you eat or drink. You feel light-headed. You are still sleepy or having trouble with balance after 24 hours. You get a rash. You have a fever. You have redness or swelling around the IV site. Get help  right away if: You have trouble breathing. You have new confusion after you get home. These symptoms may be an emergency. Get help right away. Call 911. Do not wait to see if the symptoms will go away. Do not drive yourself to the hospital. This information is not intended to replace advice given to you by your health care provider. Make sure you discuss any questions you have with your health care provider. Document Revised: 04/25/2022 Document Reviewed: 04/25/2022 Elsevier Patient Education  [place].

## 2022-11-30 ENCOUNTER — Encounter (HOSPITAL_COMMUNITY)
Admission: RE | Admit: 2022-11-30 | Discharge: 2022-11-30 | Disposition: A | Discharge status: Home / Self Care | Payer: BC Managed Care – PPO | Source: Ambulatory Visit | Attending: Internal Medicine | Admitting: Internal Medicine

## 2022-11-30 ENCOUNTER — Encounter (HOSPITAL_COMMUNITY): Payer: Self-pay

## 2022-11-30 VITALS — BP 144/93 | HR 81 | Temp 97.3°F | Resp 18 | Ht 68.0 in | Wt 241.9 lb

## 2022-11-30 DIAGNOSIS — Z87891 Personal history of nicotine dependence: Secondary | ICD-10-CM | POA: Diagnosis not present

## 2022-11-30 DIAGNOSIS — R7303 Prediabetes: Secondary | ICD-10-CM | POA: Insufficient documentation

## 2022-11-30 DIAGNOSIS — Z79899 Other long term (current) drug therapy: Secondary | ICD-10-CM | POA: Diagnosis not present

## 2022-11-30 DIAGNOSIS — R195 Other fecal abnormalities: Secondary | ICD-10-CM | POA: Diagnosis not present

## 2022-11-30 DIAGNOSIS — E785 Hyperlipidemia, unspecified: Secondary | ICD-10-CM | POA: Diagnosis not present

## 2022-11-30 DIAGNOSIS — D120 Benign neoplasm of cecum: Secondary | ICD-10-CM | POA: Diagnosis not present

## 2022-11-30 DIAGNOSIS — K648 Other hemorrhoids: Secondary | ICD-10-CM | POA: Diagnosis not present

## 2022-11-30 DIAGNOSIS — Z1211 Encounter for screening for malignant neoplasm of colon: Secondary | ICD-10-CM | POA: Diagnosis not present

## 2022-11-30 DIAGNOSIS — I10 Essential (primary) hypertension: Secondary | ICD-10-CM | POA: Diagnosis not present

## 2022-11-30 DIAGNOSIS — I2510 Atherosclerotic heart disease of native coronary artery without angina pectoris: Secondary | ICD-10-CM | POA: Diagnosis not present

## 2022-11-30 DIAGNOSIS — Z955 Presence of coronary angioplasty implant and graft: Secondary | ICD-10-CM | POA: Diagnosis not present

## 2022-11-30 DIAGNOSIS — Z01818 Encounter for other preprocedural examination: Secondary | ICD-10-CM | POA: Insufficient documentation

## 2022-11-30 DIAGNOSIS — K5730 Diverticulosis of large intestine without perforation or abscess without bleeding: Secondary | ICD-10-CM | POA: Diagnosis not present

## 2022-11-30 DIAGNOSIS — D122 Benign neoplasm of ascending colon: Secondary | ICD-10-CM | POA: Diagnosis not present

## 2022-11-30 HISTORY — DX: Unspecified osteoarthritis, unspecified site: M19.90

## 2022-11-30 HISTORY — DX: Personal history of urinary calculi: Z87.442

## 2022-11-30 HISTORY — DX: Pneumonia, unspecified organism: J18.9

## 2022-11-30 LAB — BASIC METABOLIC PANEL
Anion gap: 7 (ref 5–15)
BUN: 23 mg/dL (ref 8–23)
CO2: 26 mmol/L (ref 22–32)
Calcium: 9.1 mg/dL (ref 8.9–10.3)
Chloride: 109 mmol/L (ref 98–111)
Creatinine, Ser: 1.12 mg/dL (ref 0.61–1.24)
GFR, Estimated: >60 mL/min (ref >60)
Glucose, Bld: 130 mg/dL — ABNORMAL HIGH (ref 70–99)
Potassium: 3.7 mmol/L (ref 3.5–5.1)
Sodium: 142 mmol/L (ref 135–145)

## 2022-12-02 ENCOUNTER — Ambulatory Visit (HOSPITAL_COMMUNITY): Payer: BC Managed Care – PPO | Admitting: Anesthesiology

## 2022-12-02 ENCOUNTER — Encounter (HOSPITAL_COMMUNITY): Payer: Self-pay

## 2022-12-02 ENCOUNTER — Ambulatory Visit (HOSPITAL_COMMUNITY)
Admission: RE | Admit: 2022-12-02 | Discharge: 2022-12-02 | Disposition: A | Discharge status: Home / Self Care | Payer: BC Managed Care – PPO | Attending: Internal Medicine | Admitting: Internal Medicine

## 2022-12-02 ENCOUNTER — Encounter (HOSPITAL_COMMUNITY)
Admission: RE | Disposition: A | Discharge status: Home / Self Care | Payer: Self-pay | Source: Home / Self Care | Attending: Internal Medicine

## 2022-12-02 DIAGNOSIS — Z955 Presence of coronary angioplasty implant and graft: Secondary | ICD-10-CM | POA: Diagnosis not present

## 2022-12-02 DIAGNOSIS — Z1211 Encounter for screening for malignant neoplasm of colon: Secondary | ICD-10-CM | POA: Diagnosis not present

## 2022-12-02 DIAGNOSIS — Z87891 Personal history of nicotine dependence: Secondary | ICD-10-CM | POA: Diagnosis not present

## 2022-12-02 DIAGNOSIS — Z79899 Other long term (current) drug therapy: Secondary | ICD-10-CM | POA: Diagnosis not present

## 2022-12-02 DIAGNOSIS — D120 Benign neoplasm of cecum: Secondary | ICD-10-CM | POA: Diagnosis not present

## 2022-12-02 DIAGNOSIS — K5730 Diverticulosis of large intestine without perforation or abscess without bleeding: Secondary | ICD-10-CM | POA: Insufficient documentation

## 2022-12-02 DIAGNOSIS — R198 Other specified symptoms and signs involving the digestive system and abdomen: Secondary | ICD-10-CM | POA: Diagnosis not present

## 2022-12-02 DIAGNOSIS — R195 Other fecal abnormalities: Secondary | ICD-10-CM

## 2022-12-02 DIAGNOSIS — K648 Other hemorrhoids: Secondary | ICD-10-CM | POA: Diagnosis not present

## 2022-12-02 DIAGNOSIS — I2510 Atherosclerotic heart disease of native coronary artery without angina pectoris: Secondary | ICD-10-CM | POA: Insufficient documentation

## 2022-12-02 DIAGNOSIS — E785 Hyperlipidemia, unspecified: Secondary | ICD-10-CM | POA: Insufficient documentation

## 2022-12-02 DIAGNOSIS — D122 Benign neoplasm of ascending colon: Secondary | ICD-10-CM | POA: Diagnosis not present

## 2022-12-02 DIAGNOSIS — K635 Polyp of colon: Secondary | ICD-10-CM | POA: Diagnosis not present

## 2022-12-02 DIAGNOSIS — I10 Essential (primary) hypertension: Secondary | ICD-10-CM | POA: Insufficient documentation

## 2022-12-02 HISTORY — PX: COLONOSCOPY WITH PROPOFOL: SHX5780

## 2022-12-02 HISTORY — PX: POLYPECTOMY: SHX5525

## 2022-12-02 LAB — GLUCOSE, CAPILLARY: Glucose-Capillary: 106 mg/dL — ABNORMAL HIGH (ref 70–99)

## 2022-12-02 SURGERY — COLONOSCOPY WITH PROPOFOL
Anesthesia: General

## 2022-12-02 MED ORDER — PROPOFOL 500 MG/50ML IV EMUL
INTRAVENOUS | Status: DC | PRN
  Administered 2022-12-02: 150 ug/kg/min via INTRAVENOUS

## 2022-12-02 MED ORDER — LACTATED RINGERS IV SOLN: INTRAVENOUS | Status: DC | PRN

## 2022-12-02 MED ORDER — STERILE WATER FOR IRRIGATION IR SOLN
Status: DC | PRN
  Administered 2022-12-02: 60 mL

## 2022-12-02 MED ORDER — PROPOFOL 10 MG/ML IV BOLUS
INTRAVENOUS | Status: DC | PRN
  Administered 2022-12-02 (×2): 50 mg via INTRAVENOUS

## 2022-12-02 NOTE — Transfer of Care (Signed)
Immediate Anesthesia Transfer of Care Note  Patient: Jeremy Riley  Procedure(s) Performed: COLONOSCOPY WITH PROPOFOL POLYPECTOMY  Patient Location: PACU  Anesthesia Type:MAC  Level of Consciousness: drowsy  Airway & Oxygen Therapy: Patient Spontanous Breathing and Patient connected to nasal cannula oxygen  Post-op Assessment: Report given to RN and Post -op Vital signs reviewed and stable  Post vital signs: Reviewed and stable  Last Vitals:  Vitals Value Taken Time  BP    Temp    Pulse    Resp    SpO2      Last Pain:  Vitals:   12/02/22 0951  TempSrc: Oral  PainSc: 0-No pain         Complications: No notable events documented.

## 2022-12-02 NOTE — Op Note (Signed)
[facility] Patient Name: Jeremy Riley Procedure Date: 12/02/2022 12:04 PM MRN: [id number] Date of Birth: 1961-05-23 Attending MD: Elon Alas. Abbey Chatters , [place], [id number] CSN: [id number] Age: 61 Admit Type: Outpatient Procedure:                Colonoscopy Indications:              Positive Cologuard test Providers:                Elon Alas. Abbey Chatters, DO, Lurline Del, RN, Everardo Pacific Referring MD:              Medicines:                See the Anesthesia note for documentation of the                            administered medications Complications:            No immediate complications. Estimated Blood Loss:     Estimated blood loss was minimal. Procedure:                Pre-Anesthesia Assessment:                           - The anesthesia plan was to use monitored                            anesthesia care (MAC).                           After obtaining informed consent, the colonoscope                            was passed under direct vision. Throughout the                            procedure, the patient's blood pressure, pulse, and                            oxygen saturations were monitored continuously. The                            [id number]-HQ190L ([id number]) scope was introduced through                            the anus and advanced to the the cecum, identified                            by appendiceal orifice and ileocecal valve. The                            colonoscopy was performed without difficulty. The                            patient tolerated the procedure well. The quality  of the bowel preparation was evaluated using the                            BBPS [facility] [place] Bowel Preparation Scale) with scores                            of: Right Colon = 3, Transverse Colon = 3 and Left                            Colon = 3 (entire mucosa seen well with no residual                            staining, small fragments of  stool or opaque                            liquid). The total BBPS score equals 9. Scope In: 12:14:37 PM Scope Out: 12:26:08 PM Scope Withdrawal Time: 0 hours 9 minutes 14 seconds  Total Procedure Duration: 0 hours 11 minutes 31 seconds  Findings:      The perianal and digital rectal examinations were normal.      Non-bleeding internal hemorrhoids were found during endoscopy.      Many large-mouthed and small-mouthed diverticula were found in the       sigmoid colon and descending colon.      Four sessile polyps were found in the ascending colon and cecum. The       polyps were 4 to 6 mm in size. These polyps were removed with a cold       snare. Resection and retrieval were complete.      The exam was otherwise without abnormality. Impression:               - Non-bleeding internal hemorrhoids.                           - Diverticulosis in the sigmoid colon and in the                            descending colon.                           - Four 4 to 6 mm polyps in the ascending colon and                            in the cecum, removed with a cold snare. Resected                            and retrieved.                           - The examination was otherwise normal. Moderate Sedation:      Per Anesthesia Care Recommendation:           - Patient has a contact number available for  emergencies. The signs and symptoms of potential                            delayed complications were discussed with the                            patient. Return to normal activities tomorrow.                            Written discharge instructions were provided to the                            patient.                           - Resume previous diet.                           - Continue present medications.                           - Await pathology results.                           - Repeat colonoscopy in 5 years for surveillance.                           - Return to  GI clinic PRN. Procedure Code(s):        --- Professional ---                           [phone], Colonoscopy, flexible; with removal of                            tumor(s), polyp(s), or other lesion(s) by snare                            technique Diagnosis Code(s):        --- Professional ---                           K64.8, Other hemorrhoids                           D12.2, Benign neoplasm of ascending colon                           D12.0, Benign neoplasm of cecum                           R19.5, Other fecal abnormalities                           K57.30, Diverticulosis of large intestine without                            perforation or abscess without bleeding CPT copyright 2022 American  Medical Association. All rights reserved. The codes documented in this report are preliminary and upon coder review may  be revised to meet current compliance requirements. Elon Alas. Abbey Chatters, DO [place] Abbey Chatters, DO 12/02/2022 12:29:19 PM This report has been signed electronically. Number of Addenda: 0

## 2022-12-02 NOTE — Discharge Instructions (Signed)
  Colonoscopy Discharge Instructions  Read the instructions outlined below and refer to this sheet in the next few weeks. These discharge instructions provide you with general information on caring for yourself after you leave the hospital. Your doctor may also give you specific instructions. While your treatment has been planned according to the most current medical practices available, unavoidable complications occasionally occur.   ACTIVITY You may resume your regular activity, but move at a slower pace for the next 24 hours.  Take frequent rest periods for the next 24 hours.  Walking will help get rid of the air and reduce the bloated feeling in your belly (abdomen).  No driving for 24 hours (because of the medicine (anesthesia) used during the test).   Do not sign any important legal documents or operate any machinery for 24 hours (because of the anesthesia used during the test).  NUTRITION Drink plenty of fluids.  You may resume your normal diet as instructed by your doctor.  Begin with a light meal and progress to your normal diet. Heavy or fried foods are harder to digest and may make you feel sick to your stomach (nauseated).  Avoid alcoholic beverages for 24 hours or as instructed.  MEDICATIONS You may resume your normal medications unless your doctor tells you otherwise.  WHAT YOU CAN EXPECT TODAY Some feelings of bloating in the abdomen.  Passage of more gas than usual.  Spotting of blood in your stool or on the toilet paper.  IF YOU HAD POLYPS REMOVED DURING THE COLONOSCOPY: No aspirin products for 7 days or as instructed.  No alcohol for 7 days or as instructed.  Eat a soft diet for the next 24 hours.  FINDING OUT THE RESULTS OF YOUR TEST Not all test results are available during your visit. If your test results are not back during the visit, make an appointment with your caregiver to find out the results. Do not assume everything is normal if you have not heard from your  caregiver or the medical facility. It is important for you to follow up on all of your test results.  SEEK IMMEDIATE MEDICAL ATTENTION IF: You have more than a spotting of blood in your stool.  Your belly is swollen (abdominal distention).  You are nauseated or vomiting.  You have a temperature over 101.  You have abdominal pain or discomfort that is severe or gets worse throughout the day.   Your colonoscopy revealed 4 polyp(s) which I removed successfully. Await pathology results, my office will contact you. I recommend repeating colonoscopy in 5 years for surveillance purposes.   You also have diverticulosis and internal hemorrhoids. I would recommend increasing fiber in your diet or adding OTC Benefiber/Metamucil. Be sure to drink at least 4 to 6 glasses of water daily. Follow-up with GI as needed.   I hope you have a great rest of your week!  Elon Alas. Abbey Chatters, D.O. Gastroenterology and Hepatology [facility]

## 2022-12-02 NOTE — Interval H&P Note (Signed)
History and Physical Interval Note:  12/02/2022 11:37 AM  Jeremy Riley  has presented today for surgery, with the diagnosis of positive cologuard.  The various methods of treatment have been discussed with the patient and family. After consideration of risks, benefits and other options for treatment, the patient has consented to  Procedure(s) with comments: COLONOSCOPY WITH PROPOFOL (N/A) - 1:15 pm as a surgical intervention.  The patient's history has been reviewed, patient examined, no change in status, stable for surgery.  I have reviewed the patient's chart and labs.  Questions were answered to the patient's satisfaction.     Eloise Harman

## 2022-12-02 NOTE — Anesthesia Procedure Notes (Signed)
Procedure Name: MAC Date/Time: 12/02/2022 12:16 PM  Performed by: Lieutenant Diego, CRNAPre-anesthesia Checklist: Patient identified, Emergency Drugs available, Suction available, Patient being monitored and Timeout performed Patient Re-evaluated:Patient Re-evaluated prior to induction Oxygen Delivery Method: Nasal cannula Preoxygenation: Pre-oxygenation with 100% oxygen Induction Type: IV induction

## 2022-12-02 NOTE — Anesthesia Postprocedure Evaluation (Signed)
Anesthesia Post Note  Patient: TRON FLYTHE  Procedure(s) Performed: COLONOSCOPY WITH PROPOFOL POLYPECTOMY  Patient location during evaluation: Phase II Anesthesia Type: General Level of consciousness: awake Pain management: pain level controlled Vital Signs Assessment: post-procedure vital signs reviewed and stable Respiratory status: spontaneous breathing and respiratory function stable Cardiovascular status: blood pressure returned to baseline and stable Postop Assessment: no headache and no apparent nausea or vomiting Anesthetic complications: no Comments: Late entry   No notable events documented.   Last Vitals:  Vitals:   12/02/22 0951 12/02/22 1233  BP: 133/81 113/67  Pulse: 65 73  Resp: 14 16  Temp: 36.6 C 36.4 C  SpO2: 97% 93%    Last Pain:  Vitals:   12/02/22 1233  TempSrc: Oral  PainSc: 0-No pain                 Louann Sjogren

## 2022-12-02 NOTE — Anesthesia Preprocedure Evaluation (Signed)
Anesthesia Evaluation  Patient identified by MRN, date of birth, ID band Patient awake    Reviewed: Allergy & Precautions, H&P , NPO status , Patient's Chart, lab work & pertinent test results, reviewed documented beta blocker date and time   Airway Mallampati: II  TM Distance: >3 FB Neck ROM: full    Dental no notable dental hx.    Pulmonary neg pulmonary ROS, Patient abstained from smoking., former smoker   Pulmonary exam normal breath sounds clear to auscultation       Cardiovascular Exercise Tolerance: Good hypertension, + CAD   Rhythm:regular Rate:Normal     Neuro/Psych negative neurological ROS  negative psych ROS   GI/Hepatic negative GI ROS, Neg liver ROS,,,  Endo/Other  negative endocrine ROS    Renal/GU negative Renal ROS  negative genitourinary   Musculoskeletal   Abdominal   Peds  Hematology negative hematology ROS (+)   Anesthesia Other Findings   Reproductive/Obstetrics negative OB ROS                             Anesthesia Physical Anesthesia Plan  ASA: 3  Anesthesia Plan: General   Post-op Pain Management:    Induction:   PONV Risk Score and Plan: Propofol infusion  Airway Management Planned:   Additional Equipment:   Intra-op Plan:   Post-operative Plan:   Informed Consent: I have reviewed the patients History and Physical, chart, labs and discussed the procedure including the risks, benefits and alternatives for the proposed anesthesia with the patient or authorized representative who has indicated his/her understanding and acceptance.     Dental Advisory Given  Plan Discussed with: CRNA  Anesthesia Plan Comments:        Anesthesia Quick Evaluation

## 2022-12-06 LAB — SURGICAL PATHOLOGY

## 2022-12-14 ENCOUNTER — Encounter (HOSPITAL_COMMUNITY): Payer: Self-pay | Admitting: Internal Medicine

## 2023-03-24 ENCOUNTER — Other Ambulatory Visit: Payer: Self-pay | Admitting: Urology

## 2023-03-24 DIAGNOSIS — N138 Other obstructive and reflux uropathy: Secondary | ICD-10-CM

## 2023-04-11 ENCOUNTER — Other Ambulatory Visit: Payer: Self-pay

## 2023-04-11 ENCOUNTER — Ambulatory Visit: Payer: BC Managed Care – PPO | Admitting: Urology

## 2023-04-11 DIAGNOSIS — N200 Calculus of kidney: Secondary | ICD-10-CM

## 2023-04-12 ENCOUNTER — Encounter: Payer: Self-pay | Admitting: Urology

## 2023-04-12 ENCOUNTER — Ambulatory Visit: Payer: BC Managed Care – PPO | Admitting: Urology

## 2023-04-12 VITALS — BP 145/91 | HR 83

## 2023-04-12 DIAGNOSIS — R3912 Poor urinary stream: Secondary | ICD-10-CM

## 2023-04-12 DIAGNOSIS — Z87442 Personal history of urinary calculi: Secondary | ICD-10-CM | POA: Diagnosis not present

## 2023-04-12 DIAGNOSIS — N200 Calculus of kidney: Secondary | ICD-10-CM

## 2023-04-12 DIAGNOSIS — N401 Enlarged prostate with lower urinary tract symptoms: Secondary | ICD-10-CM | POA: Diagnosis not present

## 2023-04-12 DIAGNOSIS — N138 Other obstructive and reflux uropathy: Secondary | ICD-10-CM | POA: Diagnosis not present

## 2023-04-12 MED ORDER — TAMSULOSIN HCL 0.4 MG PO CAPS: 0.4000 mg | ORAL_CAPSULE | Freq: Every day | ORAL | 3 refills | Status: DC

## 2023-04-12 NOTE — Progress Notes (Signed)
04/12/2023 3:52 PM   Jeremy Riley 03-21-1961 [id number]  Referring provider: Babs Sciara, MD [address] [place],  [place] [address]  Followup nephrolithiasis and BPH   HPI: Mr Guntrum is a 61yo here for followup for BPH and nephrolithiasis. No stone events since last visit. No flank pain. No recent imaging.  IPSS 1 QOL 0n flomax 0.4mg  daily. Nocturia 0-1x. Urine stream strong. No straining to urinate. No other complaints today  PMH: Past Medical History:  Diagnosis Date   Arthritis    Coronary artery disease    a. LHC 05/22/2019: 99% dCx s/p DES, luminal irregularities in LAD and dominant RCA   Essential hypertension    History of kidney stones    History of pneumonia as a child    Hyperlipidemia    Lumbar disc disease    Fall from ladder   Lumbar pain 05/18/2020   Patient with known history of sciatica.  On multiple medicines for his heart cannot take anti-inflammatories, intermittent use of tramadol prescribed   Nephrolithiasis    Pneumonia    Pre-diabetes    Hgb A1c 6.3 in 05/2019.    Surgical History: Past Surgical History:  Procedure Laterality Date   COLONOSCOPY WITH PROPOFOL N/A 12/02/2022   Procedure: COLONOSCOPY WITH PROPOFOL;  Surgeon: Lanelle Bal, DO;  Location: [facility];  Service: Endoscopy;  Laterality: N/A;  1:15 pm   CORONARY STENT INTERVENTION N/A 05/22/2019   Procedure: CORONARY STENT INTERVENTION;  Surgeon: Lyn Records, MD;  Location: [facility] INVASIVE CV LAB;  Service: Cardiovascular;  Laterality: N/A;   EXTRACORPOREAL SHOCK WAVE LITHOTRIPSY Right 07/21/2020   Procedure: EXTRACORPOREAL SHOCK WAVE LITHOTRIPSY (ESWL);  Surgeon: Malen Gauze, MD;  Location: [facility] ORS;  Service: Urology;  Laterality: Right;   LEFT HEART CATH AND CORONARY ANGIOGRAPHY N/A 05/22/2019   Procedure: LEFT HEART CATH AND CORONARY ANGIOGRAPHY;  Surgeon: Lyn Records, MD;  Location: [facility] INVAS[facility] CV LAB;  Service: Cardiovascular;  Laterality: N/A;    POLYPECTOMY  12/02/2022   Procedure: POLYPECTOMY;  Surgeon: Lanelle Bal, DO;  Location: [facility];  Service: Endoscopy;;    Home Medications:  Allergies as of 04/12/2023   No Known Allergies      Medication List        Accurate as of Apr 12, 2023  3:52 PM. If you have any questions, ask your nurse or doctor.          acetaminophen 500 MG tablet Commonly known as: TYLENOL Take 500 mg by mouth every 6 (six) hours as needed for mild pain or fever.   albuterol 108 (90 Base) MCG/ACT inhaler Commonly known as: VENTOLIN HFA TAKE 2 PUFFS BY MOUTH EVERY 6 HOURS AS NEEDED FOR WHEEZE OR SHORTNESS OF BREATH   aspirin 81 MG chewable tablet Chew 1 tablet (81 mg total) by mouth daily.   atorvastatin 80 MG tablet Commonly known as: LIPITOR Take 1 tablet (80 mg total) by mouth daily.   carvedilol 3.125 MG tablet Commonly known as: COREG Take 1 tablet (3.125 mg total) by mouth 2 (two) times daily with a meal.   losartan-hydrochlorothiazide 100-12.5 MG tablet Commonly known as: HYZAAR TAKE 1 TABLET BY MOUTH EVERY DAY   nitroGLYCERIN 0.4 MG SL tablet Commonly known as: NITROSTAT Place 1 tablet (0.4 mg total) under the tongue every 5 (five) minutes x 3 doses as needed for chest pain.   tamsulosin 0.4 MG Caps capsule Commonly known as: FLOMAX Take 1 capsule (0.4  mg total) by mouth daily after supper. What changed: See the new instructions.        Allergies: No Known Allergies  Family History: Family History  Problem Relation Age of Onset   Heart disease Father        CABG age 81's    Social History:  reports that he quit smoking about 14 years ago. His smoking use included cigarettes. His smokeless tobacco use includes chew. He reports current alcohol use. He reports that he does not currently use drugs.  ROS: All other review of systems were reviewed and are negative except what is noted above in HPI  Physical Exam: BP (!) 145/91   Pulse 83   Constitutional:   Alert and oriented, No acute distress. HEENT: [place] AT, moist mucus membranes.  Trachea midline, no masses. Cardiovascular: No clubbing, cyanosis, or edema. Respiratory: Normal respiratory effort, no increased work of breathing. GI: Abdomen is soft, nontender, nondistended, no abdominal masses GU: No CVA tenderness.  Lymph: No cervical or inguinal lymphadenopathy. Skin: No rashes, bruises or suspicious lesions. Neurologic: Grossly intact, no focal deficits, moving all 4 extremities. Psychiatric: Normal mood and affect.  Laboratory Data: Lab Results  Component Value Date   WBC 5.8 09/01/2022   HGB 14.6 09/01/2022   HCT 40.3 09/01/2022   MCV 86.3 09/01/2022   PLT 214 09/01/2022    Lab Results  Component Value Date   CREATININE 1.12 11/30/2022    No results found for: "PSA"  No results found for: "TESTOSTERONE"  Lab Results  Component Value Date   HGBA1C 6.0 (H) 09/01/2022    Urinalysis    Component Value Date/Time   COLORURINE YELLOW 05/21/2008 0638   APPEARANCEUR Clear 04/06/2022 0931   LABSPEC >1.030 (H) 05/21/2008 0638   PHURINE 5.0 05/21/2008 0638   GLUCOSEU Negative 04/06/2022 0931   HGBUR LARGE (A) 05/21/2008 0638   BILIRUBINUR Negative 04/06/2022 0931   KETONESUR negative 06/23/2020 0937   KETONESUR NEGATIVE 05/21/2008 0638   PROTEINUR Negative 04/06/2022 0931   PROTEINUR NEGATIVE 05/21/2008 0638   UROBILINOGEN 0.2 06/23/2020 0937   UROBILINOGEN 0.2 05/21/2008 0638   NITRITE Negative 04/06/2022 0931   NITRITE NEGATIVE 05/21/2008 0638   LEUKOCYTESUR Negative 04/06/2022 0931    Lab Results  Component Value Date   LABMICR See below: 04/06/2022   WBCUA None seen 04/06/2022   LABEPIT None seen 04/06/2022   MUCUS Present 04/05/2021   BACTERIA None seen 04/06/2022    Pertinent Imaging:  Results for orders placed during the hospital encounter of 04/05/22  Abdomen 1 view (KUB)  Narrative CLINICAL DATA:  Nephrolithiasis  EXAM: ABDOMEN - 1  VIEW  COMPARISON:  04/05/2021  FINDINGS: 2 supine frontal views of the abdomen and pelvis are obtained. On the left, there are 3 distinct urinary tract calculi again identified, measuring 10 mm, 8 mm, and 4 mm respectively. On the right, a single 4 mm calculus is visible over the mid right renal silhouette. The remainder of the right kidney is obscured by bowel gas and stool, with calcifications overlying the upper pole of the right renal silhouette on prior study not well visualized today. No evidence of distal ureteral or bladder calculi. No bowel obstruction or ileus. No acute bony abnormalities.  IMPRESSION: 1. Bilateral renal calculi as above. Small calcifications seen within the right kidney on prior study are likely obscured by bowel gas and stool on today's exam.   Electronically Signed By: Sharlet Salina M.D. On: 04/06/2022 10:11  No results found  for this or any previous visit.  No results found for this or any previous visit.  No results found for this or any previous visit.  Results for orders placed during the hospital encounter of 09/16/20  Ultrasound renal complete  Narrative CLINICAL DATA:  Initial evaluation for nephrolithiasis. History of recent lithotripsy on 07/21/2020. Small  EXAM: RENAL / URINARY TRACT ULTRASOUND COMPLETE  COMPARISON:  Prior CT from 06/29/2020.  FINDINGS: Right Kidney:  Renal measurements: 13.4 x 5.5 x 6.3 cm = volume: 240.6 mL. Renal echogenicity within normal limits. 4 mm nonobstructive calculus present at the interpolar region. No other definite calculi visible by ultrasound. No hydronephrosis. No focal renal mass.  Left Kidney:  Renal measurements: 12.5 x 6.2 x 6.8 cm = volume: 279.2 mL. Renal echogenicity within normal limits. 11 mm nonobstructive calculus present at the mid-lower pole. Few additional probable scattered nonobstructive calculi suspected as well. No hydronephrosis. No focal renal  mass.  Bladder:  Appears normal for degree of bladder distention.  Other:  None.  IMPRESSION: Bilateral nonobstructive nephrolithiasis measuring up to 4 mm on the right and 11 mm on the left. No hydronephrosis or obstructive uropathy.   Electronically Signed By: Rise Mu M.D. On: 09/17/2020 00:02  No valid procedures specified. No results found for this or any previous visit.  Results for orders placed in visit on 06/29/20  CT RENAL STONE STUDY  Narrative CLINICAL DATA:  Right-sided flank pain with hematuria  EXAM: CT ABDOMEN AND PELVIS WITHOUT CONTRAST  TECHNIQUE: Multidetector CT imaging of the abdomen and pelvis was performed following the standard protocol without IV contrast.  COMPARISON:  CT 05/21/2008  FINDINGS: Lower chest: Lung bases demonstrate no acute consolidation or effusion. Coronary vascular calcifications.  Hepatobiliary: No focal liver abnormality is seen. No gallstones, gallbladder wall thickening, or biliary dilatation.  Pancreas: Unremarkable. No pancreatic ductal dilatation or surrounding inflammatory changes.  Spleen: Normal in size without focal abnormality.  Adrenals/Urinary Tract: Adrenal glands are normal. Multiple intrarenal stones bilaterally. Largest stone on the left is seen in the mid to lower pole and measures 11 mm. Largest stone on the right is seen within the midpole and measures up to 4 mm in size. No significant hydronephrosis or hydroureter. 6 mm stone in the proximal to mid right ureter at the inferior aspect of L4. The urinary bladder is slightly thick walled.  Stomach/Bowel: Stomach is within normal limits. Appendix appears normal. No evidence of bowel wall thickening, distention, or inflammatory changes. Diverticular disease of the left colon without acute inflammatory change.  Vascular/Lymphatic: Mild aortic atherosclerosis without aneurysm. No suspicious nodes.  Reproductive: Slightly enlarged  prostate with coarse calcifications.  Other: Negative for free air or free fluid. Small fat containing umbilical hernia. Small fat containing inguinal hernias.  Musculoskeletal: No acute or significant osseous findings.  IMPRESSION: 1. 6 mm stone within the proximal to mid right ureter at approximate inferior L4 level, though without significant hydronephrosis or proximal hydroureter. 2. Numerous intrarenal stones bilaterally 3. Diverticular disease of the left colon without acute inflammatory change.  These results will be called to the ordering clinician or representative by the Radiology Department at the imaging location.   Electronically Signed By: Jasmine Pang M.D. On: 06/29/2020 17:56   Assessment & Plan:    1. Nephrolithiasis -followup 1 year with KUB - Urinalysis, Routine w reflex microscopic  2. Benign prostatic hyperplasia with urinary obstruction Continue flomax 0.4mg  daily - tamsulosin (FLOMAX) 0.4 MG CAPS capsule; Take 1 capsule (0.4 mg  total) by mouth daily after supper.  Dispense: 90 capsule; Refill: 3  3. Weak urinary stream -continue flomax 0.4mg  daily   No follow-ups on file.  Wilkie Aye, MD  [facility] [place]

## 2023-04-12 NOTE — Patient Instructions (Signed)

## 2023-04-13 LAB — MICROSCOPIC EXAMINATION: Bacteria, UA: NONE SEEN

## 2023-04-13 LAB — URINALYSIS, ROUTINE W REFLEX MICROSCOPIC
Bilirubin, UA: NEGATIVE
Glucose, UA: NEGATIVE
Ketones, UA: NEGATIVE
Leukocytes,UA: NEGATIVE
Nitrite, UA: NEGATIVE
Specific Gravity, UA: 1.02 (ref 1.005–1.030)
Urobilinogen, Ur: 1 mg/dL (ref 0.2–1.0)
pH, UA: 5.5 (ref 5.0–7.5)

## 2023-04-27 ENCOUNTER — Ambulatory Visit (INDEPENDENT_AMBULATORY_CARE_PROVIDER_SITE_OTHER): Payer: BC Managed Care – PPO | Admitting: Family Medicine

## 2023-04-27 VITALS — BP 135/87 | HR 94 | Temp 97.5°F | Ht 68.0 in | Wt 251.0 lb

## 2023-04-27 DIAGNOSIS — H5712 Ocular pain, left eye: Secondary | ICD-10-CM | POA: Diagnosis not present

## 2023-04-27 DIAGNOSIS — Z0001 Encounter for general adult medical examination with abnormal findings: Secondary | ICD-10-CM

## 2023-04-27 DIAGNOSIS — Z125 Encounter for screening for malignant neoplasm of prostate: Secondary | ICD-10-CM | POA: Diagnosis not present

## 2023-04-27 DIAGNOSIS — R7303 Prediabetes: Secondary | ICD-10-CM | POA: Diagnosis not present

## 2023-04-27 DIAGNOSIS — I10 Essential (primary) hypertension: Secondary | ICD-10-CM | POA: Diagnosis not present

## 2023-04-27 DIAGNOSIS — Z0000 Encounter for general adult medical examination without abnormal findings: Secondary | ICD-10-CM

## 2023-04-27 NOTE — Progress Notes (Signed)
   Subjective:    Patient ID: Jeremy Riley, male    DOB: July 16, 1961, 62 y.o.   MRN: [id number]  HPI The patient comes in today for a wellness visit.    A review of their health history was completed.  A review of medications was also completed.  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   Additional concerns: none    Review of Systems     Objective:   Physical Exam  General-in no acute distress Eyes-no discharge Lungs-respiratory rate normal, CTA CV-no murmurs,RRR Extremities skin warm dry no edema Neuro grossly normal Behavior normal, alert       Assessment & Plan:  1. Well adult exam Patient to do lab work between now and summer yearly wellness recommended Adult wellness-complete.wellness physical was conducted today. Importance of diet and exercise were discussed in detail.  Importance of stress reduction and healthy living were discussed.  In addition to this a discussion regarding safety was also covered.  We also reviewed over immunizations and gave recommendations regarding current immunization needed for age.   In addition to this additional areas were also touched on including: Preventative health exams needed:  Colonoscopy 2033  Patient was advised yearly wellness exam  - PSA - Hemoglobin A1c - Comprehensive metabolic panel - Lipid Panel - Hepatic Function Panel - Microalbumin/Creatinine Ratio, Urine  2. Pre-diabetes Watch starches in diet stay active - Hemoglobin A1c - Comprehensive metabolic panel - Microalbumin/Creatinine Ratio, Urine  3. Primary hypertension Blood pressure under good control continue current measures - Lipid Panel - Microalbumin/Creatinine Ratio, Urine  4. Screening PSA (prostate specific antigen) Screening PSA patient defers  prostate exam - PSA  Patient defers lung cancer screening program

## 2023-07-02 ENCOUNTER — Other Ambulatory Visit: Payer: Self-pay | Admitting: Cardiology

## 2023-07-06 DIAGNOSIS — Z125 Encounter for screening for malignant neoplasm of prostate: Secondary | ICD-10-CM | POA: Diagnosis not present

## 2023-07-06 DIAGNOSIS — I10 Essential (primary) hypertension: Secondary | ICD-10-CM | POA: Diagnosis not present

## 2023-07-06 DIAGNOSIS — R7303 Prediabetes: Secondary | ICD-10-CM | POA: Diagnosis not present

## 2023-07-06 DIAGNOSIS — Z0000 Encounter for general adult medical examination without abnormal findings: Secondary | ICD-10-CM | POA: Diagnosis not present

## 2023-08-30 ENCOUNTER — Ambulatory Visit: Payer: BC Managed Care – PPO | Attending: Cardiology | Admitting: Cardiology

## 2023-08-30 ENCOUNTER — Encounter: Payer: Self-pay | Admitting: Cardiology

## 2023-08-30 VITALS — BP 130/90 | HR 93 | Ht 68.0 in | Wt 252.5 lb

## 2023-08-30 DIAGNOSIS — I10 Essential (primary) hypertension: Secondary | ICD-10-CM | POA: Diagnosis not present

## 2023-08-30 DIAGNOSIS — E782 Mixed hyperlipidemia: Secondary | ICD-10-CM | POA: Diagnosis not present

## 2023-08-30 DIAGNOSIS — I25119 Atherosclerotic heart disease of native coronary artery with unspecified angina pectoris: Secondary | ICD-10-CM | POA: Diagnosis not present

## 2023-08-30 NOTE — Patient Instructions (Signed)
Medication Instructions:  Your physician recommends that you continue on your current medications as directed. Please refer to the Current Medication list given to you today.   Labwork: None today  Testing/Procedures: None today  Follow-Up: 1 year with Dr.McDowell  Any Other Special Instructions Will Be Listed Below (If Applicable).  If you need a refill on your cardiac medications before your next appointment, please call your pharmacy.

## 2023-08-30 NOTE — Progress Notes (Signed)
Cardiology Office Note  Date: 08/30/2023   ID: Jeremy Riley, DOB September 01, 1961, MRN [id number]  History of Present Illness: Jeremy Riley is a 62 y.o. male last seen in September 2023 by Ms. Strader PA-C, I reviewed the note.  He is here for a routine visit.  Continues to work for [employer], staying busy.  He does not report any new exertional symptoms, no angina or nitroglycerin use, no increasing dyspnea exertion, no palpitations or syncope.  I reviewed his medications which are stable from a cardiac perspective.  He does not report any drug intolerances.  LDL was 61 in July on Lipitor.  ECG today shows sinus rhythm.  Physical Exam: VS:  BP (!) 130/90 (BP Location: Left Arm, Patient Position: Sitting, Cuff Size: Normal)   Pulse 93   Ht 5\' 8"  (1.727 m)   Wt 252 lb 8 oz (114.5 kg)   SpO2 95%   BMI 38.39 kg/m , BMI Body mass index is 38.39 kg/m.  Wt Readings from Last 3 Encounters:  08/30/23 252 lb 8 oz (114.5 kg)  04/27/23 251 lb (113.9 kg)  12/02/22 241 lb 13.9 oz (109.7 kg)    General: Patient appears comfortable at rest. HEENT: Conjunctiva and lids normal. Neck: Supple, no elevated JVP or carotid bruits. Lungs: Clear to auscultation, nonlabored breathing at rest. Cardiac: Regular rate and rhythm, no S3 or significant systolic murmur. Extremities: No pitting edema.  ECG:  An ECG dated 08/26/2022 was personally reviewed today and demonstrated:  Sinus rhythm.  Labwork: 09/01/2022: Hemoglobin 14.6; Platelets 214 07/06/2023: ALT 16; AST 14; BUN 18; Creatinine, Ser 1.16; Potassium 4.3; Sodium 143     Component Value Date/Time   CHOL 115 07/06/2023 1213   TRIG 100 07/06/2023 1213   HDL 35 (L) 07/06/2023 1213   CHOLHDL 3.3 07/06/2023 1213   CHOLHDL 3.3 09/01/2022 1229   VLDL 15 09/01/2022 1229   LDLCALC 61 07/06/2023 1213   Other Studies Reviewed Today:  No interval cardiac testing for review today.  Assessment and Plan:  1.  CAD status post DES to the  distal circumflex in June 2020.  Follow-up Lexiscan Myoview in January 2022 was consistent with inferior/inferolateral scar with mild peri-infarct ischemia, LVEF 64%.  He does not describe any active angina or nitroglycerin use.  ECG reviewed.  Continue observation on medical therapy.  He is on aspirin, Coreg, Hyzaar, Lipitor, and as needed nitroglycerin.  2.  Essential hypertension.  No changes made to current regimen.  Keep follow-up with Dr. Gerda Diss.  3.  Mixed hyperlipidemia.  LDL 61 in July.  He continues on Lipitor 80 mg daily.   Disposition:  Follow up  1 year, sooner if needed.  Signed, Jonelle Sidle, M.D., F.A.C.C. [facility]

## 2023-09-07 ENCOUNTER — Other Ambulatory Visit: Payer: Self-pay | Admitting: Student

## 2023-09-28 ENCOUNTER — Other Ambulatory Visit: Payer: Self-pay | Admitting: Student

## 2023-12-26 IMAGING — DX DG ABDOMEN 1V
2 series · 2 of 2 positions shown · non-contrast
Comparison: 04/05/2021

CLINICAL DATA: Nephrolithiasis

EXAM:
ABDOMEN - 1 VIEW

[abdomen kub (1 of 2)]
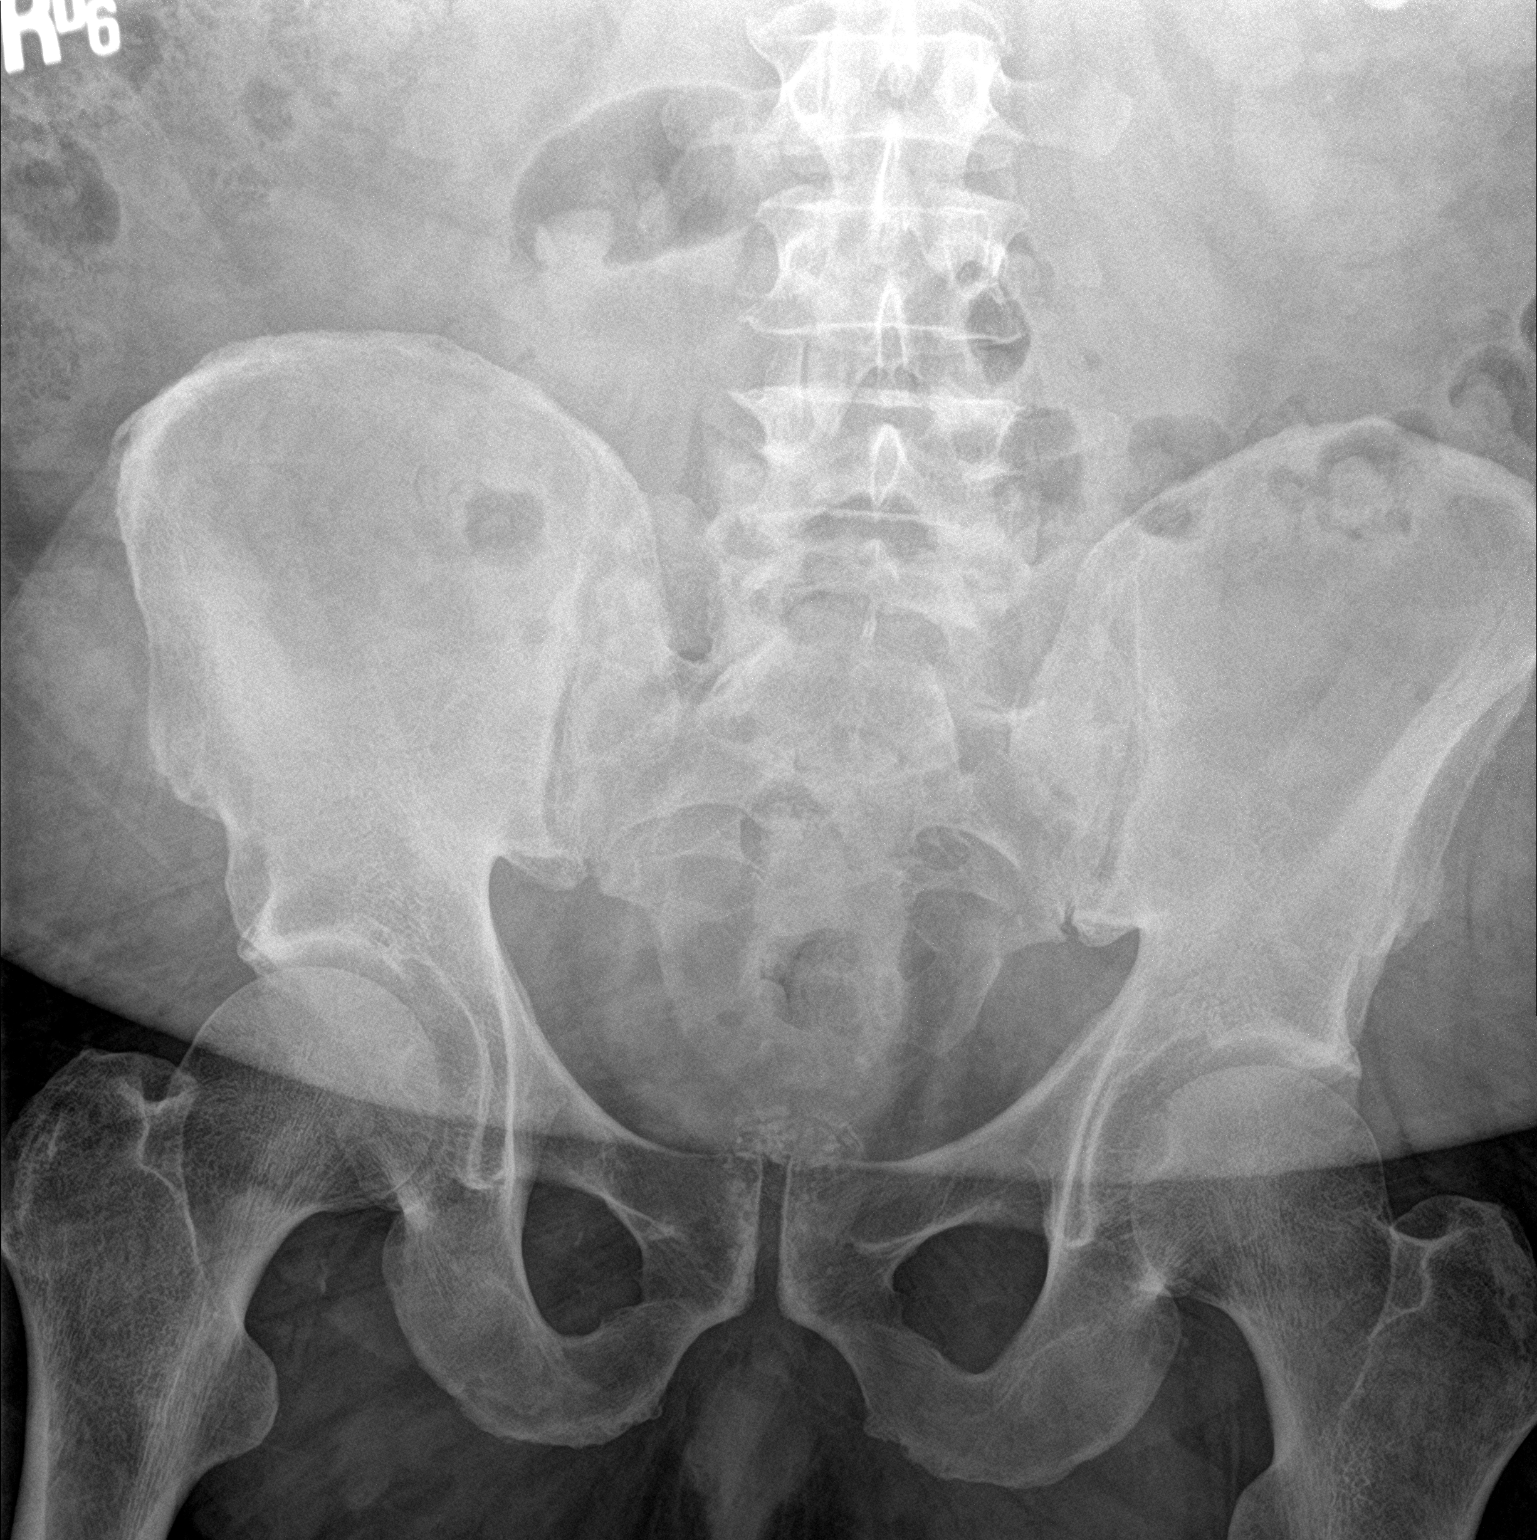

[abdomen kub (2 of 2)]
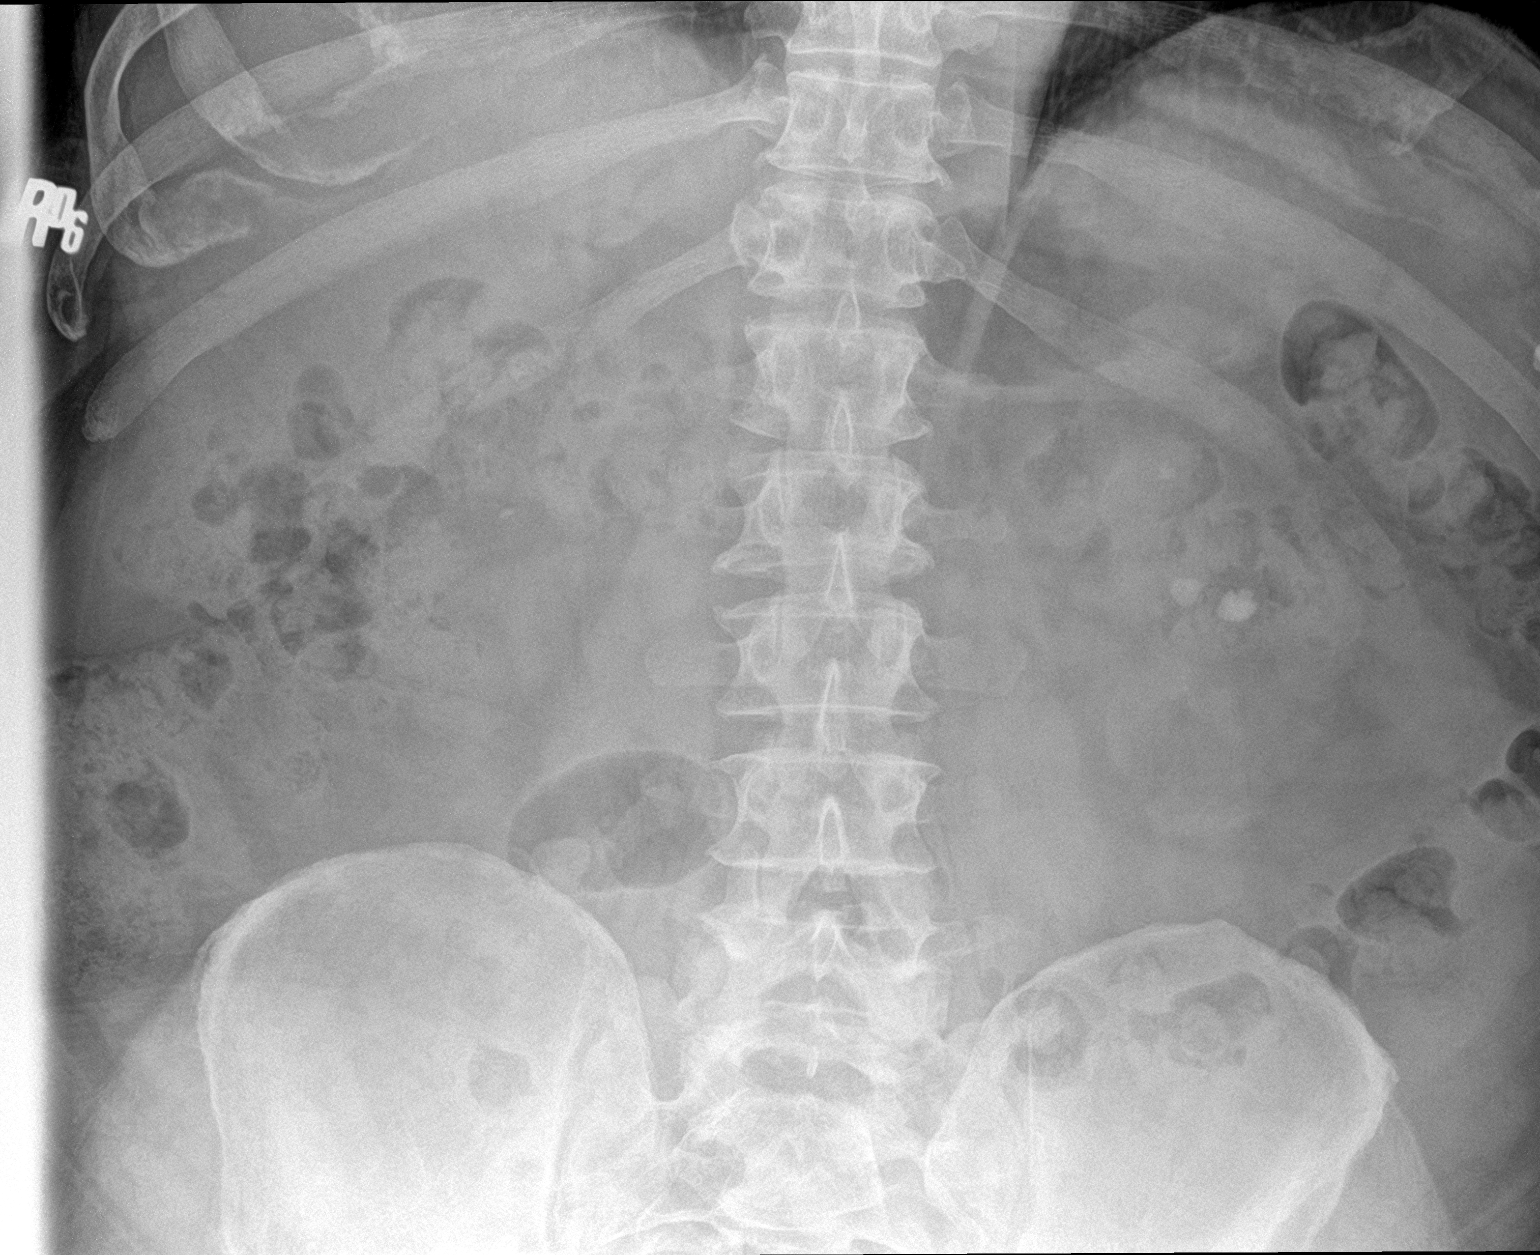

[2 of 2 positions shown; findings below may reference images not displayed]

FINDINGS: 2 supine frontal views of the abdomen and pelvis are obtained. On
the left, there are 3 distinct urinary tract calculi again
identified, measuring 10 mm, 8 mm, and 4 mm respectively. On the
right, a single 4 mm calculus is visible over the mid right renal
silhouette. The remainder of the right kidney is obscured by bowel
gas and stool, with calcifications overlying the upper pole of the
right renal silhouette on prior study not well visualized today. No
evidence of distal ureteral or bladder calculi. No bowel obstruction
or ileus. No acute bony abnormalities.
IMPRESSION: 1. Bilateral renal calculi as above. Small calcifications seen
within the right kidney on prior study are likely obscured by bowel
gas and stool on today's exam.

## 2024-02-12 DIAGNOSIS — J22 Unspecified acute lower respiratory infection: Secondary | ICD-10-CM | POA: Diagnosis not present

## 2024-02-12 DIAGNOSIS — R030 Elevated blood-pressure reading, without diagnosis of hypertension: Secondary | ICD-10-CM | POA: Diagnosis not present

## 2024-02-12 DIAGNOSIS — R059 Cough, unspecified: Secondary | ICD-10-CM | POA: Diagnosis not present

## 2024-02-12 DIAGNOSIS — Z6835 Body mass index (BMI) 35.0-35.9, adult: Secondary | ICD-10-CM | POA: Diagnosis not present

## 2024-04-15 ENCOUNTER — Ambulatory Visit: Payer: BC Managed Care – PPO | Admitting: Urology

## 2024-04-22 ENCOUNTER — Ambulatory Visit: Payer: BC Managed Care – PPO | Admitting: Urology

## 2024-04-22 ENCOUNTER — Encounter: Payer: Self-pay | Admitting: Urology

## 2024-04-22 VITALS — BP 147/87 | HR 83

## 2024-04-22 DIAGNOSIS — N200 Calculus of kidney: Secondary | ICD-10-CM | POA: Diagnosis not present

## 2024-04-22 DIAGNOSIS — N401 Enlarged prostate with lower urinary tract symptoms: Secondary | ICD-10-CM

## 2024-04-22 DIAGNOSIS — R3912 Poor urinary stream: Secondary | ICD-10-CM | POA: Diagnosis not present

## 2024-04-22 DIAGNOSIS — N138 Other obstructive and reflux uropathy: Secondary | ICD-10-CM

## 2024-04-22 MED ORDER — TAMSULOSIN HCL 0.4 MG PO CAPS: 0.4000 mg | ORAL_CAPSULE | Freq: Every day | ORAL | 3 refills | Status: AC

## 2024-04-22 NOTE — Patient Instructions (Signed)

## 2024-04-22 NOTE — Progress Notes (Signed)
 04/22/2024 3:55 PM   Jeremy Riley 04/01/1961 [id number]  Referring provider: Bennet Brasil, MD [address] [place],  [place] [address]  Followup BPH   HPI: [name] is a 63yo here for followup for BPh and nephrolithiasis. No stone events since last visit. No flank pain. No recent imaging. IPSS 10 QOl 1 on flomax  0.4mg  daily. Uirne stream strong. No straining to urinate. Nocturia 1-2x depending on fluid consumtion.    PMH: Past Medical History:  Diagnosis Date   Arthritis    Coronary artery disease    a. LHC 05/22/2019: 99% dCx s/p DES, luminal irregularities in LAD and dominant RCA   Essential hypertension    History of kidney stones    History of pneumonia as a child    Hyperlipidemia    Lumbar disc disease    Fall from ladder   Lumbar pain 05/18/2020   Patient with known history of sciatica.  On multiple medicines for his heart cannot take anti-inflammatories, intermittent use of tramadol  prescribed   Nephrolithiasis    Pneumonia    Pre-diabetes    Hgb A1c 6.3 in 05/2019.    Surgical History: Past Surgical History:  Procedure Laterality Date   COLONOSCOPY WITH PROPOFOL  N/A 12/02/2022   Procedure: COLONOSCOPY WITH PROPOFOL ;  Surgeon: Vinetta Greening, DO;  Location: [facility];  Service: Endoscopy;  Laterality: N/A;  1:15 pm   CORONARY STENT INTERVENTION N/A 05/22/2019   Procedure: CORONARY STENT INTERVENTION;  Surgeon: Arty Binning, MD;  Location: [facility];  Service: Cardiovascular;  Laterality: N/A;   EXTRACORPOREAL SHOCK WAVE LITHOTRIPSY Right 07/21/2020   Procedure: EXTRACORPOREAL SHOCK WAVE LITHOTRIPSY (ESWL);  Surgeon: Marco Severs, MD;  Location: [facility];  Service: Urology;  Laterality: Right;   LEFT HEART CATH AND CORONARY ANGIOGRAPHY N/A 05/22/2019   Procedure: LEFT HEART CATH AND CORONARY ANGIOGRAPHY;  Surgeon: Arty Binning, MD;  Location: [facility];  Service: Cardiovascular;  Laterality: N/A;   POLYPECTOMY   12/02/2022   Procedure: POLYPECTOMY;  Surgeon: Vinetta Greening, DO;  Location: [facility];  Service: Endoscopy;;    Home Medications:  Allergies as of 04/22/2024   No Known Allergies      Medication List        Accurate as of Apr 22, 2024  3:55 PM. If you have any questions, ask your nurse or doctor.          acetaminophen  500 MG tablet Commonly known as: TYLENOL  Take 500 mg by mouth every 6 (six) hours as needed for mild pain or fever.   albuterol  108 (90 Base) MCG/ACT inhaler Commonly known as: VENTOLIN  HFA TAKE 2 PUFFS BY MOUTH EVERY 6 HOURS AS NEEDED FOR WHEEZE OR SHORTNESS OF BREATH   aspirin  81 MG chewable tablet Chew 1 tablet (81 mg total) by mouth daily.   atorvastatin  80 MG tablet Commonly known as: LIPITOR TAKE 1 TABLET BY MOUTH EVERY DAY   carvedilol  3.125 MG tablet Commonly known as: COREG  TAKE 1 TABLET BY MOUTH TWICE A DAY WITH A MEAL   losartan -hydrochlorothiazide  100-12.5 MG tablet Commonly known as: HYZAAR TAKE 1 TABLET BY MOUTH EVERY DAY   nitroGLYCERIN  0.4 MG SL tablet Commonly known as: NITROSTAT  Place 1 tablet (0.4 mg total) under the tongue every 5 (five) minutes x 3 doses as needed for chest pain.   tamsulosin  0.4 MG Caps capsule Commonly known as: FLOMAX  Take 1 capsule (0.4 mg total) by mouth daily after  supper.        Allergies: No Known Allergies  Family History: Family History  Problem Relation Age of Onset   Heart disease Father        CABG age 67's    Social History:  reports that he quit smoking about 15 years ago. His smoking use included cigarettes. He started smoking about 45 years ago. His smokeless tobacco use includes chew. He reports current alcohol use. He reports that he does not currently use drugs.  ROS: All other review of systems were reviewed and are negative except what is noted above in HPI  Physical Exam: BP (!) 147/87   Pulse 83   Constitutional:  Alert and oriented, No acute distress. HEENT:  [place] AT, moist mucus membranes.  Trachea midline, no masses. Cardiovascular: No clubbing, cyanosis, or edema. Respiratory: Normal respiratory effort, no increased work of breathing. GI: Abdomen is soft, nontender, nondistended, no abdominal masses GU: No CVA tenderness.  Lymph: No cervical or inguinal lymphadenopathy. Skin: No rashes, bruises or suspicious lesions. Neurologic: Grossly intact, no focal deficits, moving all 4 extremities. Psychiatric: Normal mood and affect.  Laboratory Data: Lab Results  Component Value Date   WBC 5.8 09/01/2022   HGB 14.6 09/01/2022   HCT 40.3 09/01/2022   MCV 86.3 09/01/2022   PLT 214 09/01/2022    Lab Results  Component Value Date   CREATININE 1.16 07/06/2023    No results found for: "PSA"  No results found for: "TESTOSTERONE"  Lab Results  Component Value Date   HGBA1C 6.4 (H) 07/06/2023    Urinalysis    Component Value Date/Time   COLORURINE YELLOW 05/21/2008 0638   APPEARANCEUR Clear 04/12/2023 1531   LABSPEC >1.030 (H) 05/21/2008 0638   PHURINE 5.0 05/21/2008 0638   GLUCOSEU Negative 04/12/2023 1531   HGBUR LARGE (A) 05/21/2008 0638   BILIRUBINUR Negative 04/12/2023 1531   KETONESUR negative 06/23/2020 0937   KETONESUR NEGATIVE 05/21/2008 0638   PROTEINUR Trace 04/12/2023 1531   PROTEINUR NEGATIVE 05/21/2008 0638   UROBILINOGEN 0.2 06/23/2020 0937   UROBILINOGEN 0.2 05/21/2008 0638   NITRITE Negative 04/12/2023 1531   NITRITE NEGATIVE 05/21/2008 0638   LEUKOCYTESUR Negative 04/12/2023 1531    Lab Results  Component Value Date   LABMICR 22.0 07/06/2023   WBCUA 0-5 04/12/2023   LABEPIT 0-10 04/12/2023   MUCUS Present 04/05/2021   BACTERIA None seen 04/12/2023    Pertinent Imaging:  Results for orders placed during the hospital encounter of 04/05/22  Abdomen 1 view (KUB)  Narrative CLINICAL DATA:  Nephrolithiasis  EXAM: ABDOMEN - 1 VIEW  COMPARISON:  04/05/2021  FINDINGS: 2 supine frontal views of the  abdomen and pelvis are obtained. On the left, there are 3 distinct urinary tract calculi again identified, measuring 10 mm, 8 mm, and 4 mm respectively. On the right, a single 4 mm calculus is visible over the mid right renal silhouette. The remainder of the right kidney is obscured by bowel gas and stool, with calcifications overlying the upper pole of the right renal silhouette on prior study not well visualized today. No evidence of distal ureteral or bladder calculi. No bowel obstruction or ileus. No acute bony abnormalities.  IMPRESSION: 1. Bilateral renal calculi as above. Small calcifications seen within the right kidney on prior study are likely obscured by bowel gas and stool on today's exam.   Electronically Signed By: Bobbye Burrow M.D. On: 04/06/2022 10:11  No results found for this or any previous visit.  No  results found for this or any previous visit.  No results found for this or any previous visit.  Results for orders placed during the hospital encounter of 09/16/20  Ultrasound renal complete  Narrative CLINICAL DATA:  Initial evaluation for nephrolithiasis. History of recent lithotripsy on 07/21/2020. Small  EXAM: RENAL / URINARY TRACT ULTRASOUND COMPLETE  COMPARISON:  Prior CT from 06/29/2020.  FINDINGS: Right Kidney:  Renal measurements: 13.4 x 5.5 x 6.3 cm = volume: 240.6 mL. Renal echogenicity within normal limits. 4 mm nonobstructive calculus present at the interpolar region. No other definite calculi visible by ultrasound. No hydronephrosis. No focal renal mass.  Left Kidney:  Renal measurements: 12.5 x 6.2 x 6.8 cm = volume: 279.2 mL. Renal echogenicity within normal limits. 11 mm nonobstructive calculus present at the mid-lower pole. Few additional probable scattered nonobstructive calculi suspected as well. No hydronephrosis. No focal renal mass.  Bladder:  Appears normal for degree of bladder  distention.  Other:  None.  IMPRESSION: Bilateral nonobstructive nephrolithiasis measuring up to 4 mm on the right and 11 mm on the left. No hydronephrosis or obstructive uropathy.   Electronically Signed By: Virgia Griffins M.D. On: 09/17/2020 00:02  No results found for this or any previous visit.  No results found for this or any previous visit.  Results for orders placed in visit on 06/29/20  CT RENAL STONE STUDY  Narrative CLINICAL DATA:  Right-sided flank pain with hematuria  EXAM: CT ABDOMEN AND PELVIS WITHOUT CONTRAST  TECHNIQUE: Multidetector CT imaging of the abdomen and pelvis was performed following the standard protocol without IV contrast.  COMPARISON:  CT 05/21/2008  FINDINGS: Lower chest: Lung bases demonstrate no acute consolidation or effusion. Coronary vascular calcifications.  Hepatobiliary: No focal liver abnormality is seen. No gallstones, gallbladder wall thickening, or biliary dilatation.  Pancreas: Unremarkable. No pancreatic ductal dilatation or surrounding inflammatory changes.  Spleen: Normal in size without focal abnormality.  Adrenals/Urinary Tract: Adrenal glands are normal. Multiple intrarenal stones bilaterally. Largest stone on the left is seen in the mid to lower pole and measures 11 mm. Largest stone on the right is seen within the midpole and measures up to 4 mm in size. No significant hydronephrosis or hydroureter. 6 mm stone in the proximal to mid right ureter at the inferior aspect of L4. The urinary bladder is slightly thick walled.  Stomach/Bowel: Stomach is within normal limits. Appendix appears normal. No evidence of bowel wall thickening, distention, or inflammatory changes. Diverticular disease of the left colon without acute inflammatory change.  Vascular/Lymphatic: Mild aortic atherosclerosis without aneurysm. No suspicious nodes.  Reproductive: Slightly enlarged prostate with coarse  calcifications.  Other: Negative for free air or free fluid. Small fat containing umbilical hernia. Small fat containing inguinal hernias.  Musculoskeletal: No acute or significant osseous findings.  IMPRESSION: 1. 6 mm stone within the proximal to mid right ureter at approximate inferior L4 level, though without significant hydronephrosis or proximal hydroureter. 2. Numerous intrarenal stones bilaterally 3. Diverticular disease of the left colon without acute inflammatory change.  These results will be called to the ordering clinician or representative by the Radiology Department at the imaging location.   Electronically Signed By: Esmeralda Hedge M.D. On: 06/29/2020 17:56   Assessment & Plan:    1. Nephrolithiasis (Primary) -followup 1 year with KUB - Urinalysis, Routine w reflex microscopic  2. Benign prostatic hyperplasia with urinary obstruction Continue flomax  0.4mg  daily  3. Weak urinary stream Continue flomax  0.4mg  daily   No follow-ups  on file.  Johnie Nailer, MD  [facility] [place]

## 2024-04-23 LAB — URINALYSIS, ROUTINE W REFLEX MICROSCOPIC
Bilirubin, UA: NEGATIVE
Ketones, UA: NEGATIVE
Nitrite, UA: NEGATIVE
Protein,UA: NEGATIVE
Specific Gravity, UA: 1.02 (ref 1.005–1.030)
Urobilinogen, Ur: 1 mg/dL (ref 0.2–1.0)
pH, UA: 6 (ref 5.0–7.5)

## 2024-04-23 LAB — MICROSCOPIC EXAMINATION: Bacteria, UA: NONE SEEN

## 2024-06-06 ENCOUNTER — Encounter: Payer: Self-pay | Admitting: Family Medicine

## 2024-06-06 ENCOUNTER — Ambulatory Visit: Admitting: Family Medicine

## 2024-06-06 ENCOUNTER — Ambulatory Visit: Payer: Self-pay | Admitting: Family Medicine

## 2024-06-06 VITALS — BP 126/68 | HR 91 | Temp 98.1°F | Ht 68.0 in | Wt 247.0 lb

## 2024-06-06 DIAGNOSIS — Z79899 Other long term (current) drug therapy: Secondary | ICD-10-CM

## 2024-06-06 DIAGNOSIS — R350 Frequency of micturition: Secondary | ICD-10-CM

## 2024-06-06 DIAGNOSIS — Z125 Encounter for screening for malignant neoplasm of prostate: Secondary | ICD-10-CM | POA: Diagnosis not present

## 2024-06-06 DIAGNOSIS — R7303 Prediabetes: Secondary | ICD-10-CM

## 2024-06-06 DIAGNOSIS — I10 Essential (primary) hypertension: Secondary | ICD-10-CM

## 2024-06-06 DIAGNOSIS — Z0000 Encounter for general adult medical examination without abnormal findings: Secondary | ICD-10-CM

## 2024-06-06 DIAGNOSIS — Z0001 Encounter for general adult medical examination with abnormal findings: Secondary | ICD-10-CM | POA: Diagnosis not present

## 2024-06-06 LAB — POCT URINALYSIS DIP (CLINITEK)
Bilirubin, UA: NEGATIVE
Glucose, UA: NEGATIVE mg/dL
Ketones, POC UA: NEGATIVE mg/dL
Leukocytes, UA: NEGATIVE
Nitrite, UA: NEGATIVE
Spec Grav, UA: 1.02 (ref 1.010–1.025)
Urobilinogen, UA: NEGATIVE U/dL — AB
pH, UA: 6.5 (ref 5.0–8.0)

## 2024-06-06 NOTE — Progress Notes (Signed)
 Subjective:    Patient ID: Jeremy Riley, male    DOB: 29-Dec-1960, 63 y.o.   MRN: [id number]  HPI . The patient comes in today for a wellness visit.    A review of their health history was completed.  A review of medications was also completed.  Any needed refills; none  Eating habits: fair  Falls/  MVA accidents in past few months: no  Regular exercise:   Specialist pt sees on regular basis: urology, cardiology  Preventative health issues were discussed.   Additional concerns:   Patient denies any chest tightness pressure pain shortness of breath Relates adherence to his medicines Does do vape but does not smoke  Discussed the use of AI scribe software for clinical note transcription with the patient, who gave verbal consent to proceed.  History of Present Illness   Jeremy Riley is a 63 year old male who presents for wellness and a follow-up regarding urinary symptoms and kidney stone management.  Lower urinary tract symptoms - Occasional urinary leakage associated with kidney stone - Weaker urinary stream compared to baseline - Nocturia, typically waking once nightly around 2 AM to urinate - Recent check-up indicated no immediate concerns, but persistent urinary changes  Medication adherence - Uses a pill box for medication management - Takes three pills in the morning and three at night - Occasionally misses a dose but takes it upon returning home  Tobacco and alcohol use - Vapes and uses smokeless tobacco, consuming about half a can every two to three days - Drinks alcohol moderately, sharing a pitcher on weekends and having a couple of drinks on the golf [place]  Physical activity - Plays golf three to four times per week - Works on job sites - Engages in camping and fishing, recently caught a large black drum on his birthday      Review of Systems     Objective:   Physical Exam General-in no acute distress Eyes-no discharge Lungs-respiratory  rate normal, CTA CV-no murmurs,RRR Extremities skin warm dry no edema Neuro grossly normal Behavior normal, alert Head and neck exam no nodules felt eardrums normal mouth is normal abdomen is soft extremities no significant edema       Assessment & Plan:  Assessment and Plan    Urinary Frequency Nocturia once per night is normal for his age. Urinalysis showed occasional RBC and WBC, indicating possible infection or urinary issue. - Perform urine microscopy to investigate abnormal urinalysis. - Order blood work for cholesterol, glucose, and PSA levels.  Kidney Stone Condition well-managed per recent specialist visit. X-ray recommended to assess status before next year's visit. - Order x-ray to assess kidney stone status before next year's visit.  Prostate Health Prostate exam normal. Discussed importance of exam and PSA testing for monitoring. - Perform PSA blood test as part of routine blood work.  General Health Maintenance Engages in regular physical activity, maintains positive mood, uses pill box but occasionally misses doses. Vapes, uses smokeless tobacco, moderate alcohol consumption. - Encourage regular physical activity and adherence to medication regimen. - Advise moderation in vaping and smokeless tobacco use. - Schedule wellness checkup in one year.  Follow-up Scheduled for biopsy on July 1st with follow-up visit to discuss results. Follow-up visit in January advised for overall health assessment. - Schedule follow-up visit to discuss biopsy results. - Plan follow-up visit in January for overall health assessment.       Adult wellness-complete.wellness physical was conducted today. Importance of diet and  exercise were discussed in detail.  Importance of stress reduction and healthy living were discussed.  In addition to this a discussion regarding safety was also covered.  We also reviewed over immunizations and gave recommendations regarding current immunization  needed for age.   In addition to this additional areas were also touched on including: Preventative health exams needed:  Colonoscopy 2033  Patient was advised yearly wellness exam  Lab work pending will let patient know results Recommend follow-up approximately 6 months  1. Well adult exam (Primary) See documentation above - Hemoglobin A1c - Lipid Panel - Hepatic Function Panel - Basic Metabolic Panel - Microalbumin/Creatinine Ratio, Urine - PSA  2. Urinary frequency I do not find any evidence of any infection going on there are some red blood cells which go along with his history of kidney stones being followed by urology - POCT URINALYSIS DIP (CLINITEK)  3. Screening PSA (prostate specific antigen) Screening PSA ordered - PSA  4. Pre-diabetes Minimize carbohydrates stay physically active avoid excessive alcohol check A1c - Hemoglobin A1c  5. Primary hypertension Continue current medication blood pressure good control fit and walking and golf on a regular basis - Basic Metabolic Panel - Microalbumin/Creatinine Ratio, Urine  6. High risk medication use Lab work ordered - Adult nurse

## 2024-06-20 DIAGNOSIS — R7303 Prediabetes: Secondary | ICD-10-CM | POA: Diagnosis not present

## 2024-06-20 DIAGNOSIS — I10 Essential (primary) hypertension: Secondary | ICD-10-CM | POA: Diagnosis not present

## 2024-06-20 DIAGNOSIS — Z125 Encounter for screening for malignant neoplasm of prostate: Secondary | ICD-10-CM | POA: Diagnosis not present

## 2024-06-20 DIAGNOSIS — Z79899 Other long term (current) drug therapy: Secondary | ICD-10-CM | POA: Diagnosis not present

## 2024-06-20 DIAGNOSIS — Z0000 Encounter for general adult medical examination without abnormal findings: Secondary | ICD-10-CM | POA: Diagnosis not present

## 2024-06-21 LAB — LIPID PANEL
Chol/HDL Ratio: 4.1 ratio (ref 0.0–5.0)
Cholesterol, Total: 112 mg/dL (ref 100–199)
HDL: 27 mg/dL — ABNORMAL LOW (ref >39)
LDL Chol Calc (NIH): 58 mg/dL (ref 0–99)
Triglycerides: 155 mg/dL — ABNORMAL HIGH (ref 0–149)
VLDL Cholesterol Cal: 27 mg/dL (ref 5–40)

## 2024-06-21 LAB — PSA: Prostate Specific Ag, Serum: 2 ng/mL (ref 0.0–4.0)

## 2024-06-21 LAB — HEPATIC FUNCTION PANEL
ALT: 30 IU/L (ref 0–44)
AST: 26 IU/L (ref 0–40)
Albumin: 4.2 g/dL (ref 3.9–4.9)
Alkaline Phosphatase: 67 IU/L (ref 44–121)
Bilirubin Total: 0.7 mg/dL (ref 0.0–1.2)
Bilirubin, Direct: 0.25 mg/dL (ref 0.00–0.40)
Total Protein: 6.7 g/dL (ref 6.0–8.5)

## 2024-06-21 LAB — BASIC METABOLIC PANEL WITH GFR
BUN/Creatinine Ratio: 16 (ref 10–24)
BUN: 18 mg/dL (ref 8–27)
CO2: 23 mmol/L (ref 20–29)
Calcium: 9.1 mg/dL (ref 8.6–10.2)
Chloride: 100 mmol/L (ref 96–106)
Creatinine, Ser: 1.14 mg/dL (ref 0.76–1.27)
Glucose: 119 mg/dL — ABNORMAL HIGH (ref 70–99)
Potassium: 4.2 mmol/L (ref 3.5–5.2)
Sodium: 139 mmol/L (ref 134–144)
eGFR: 72 mL/min/1.73 (ref >59)

## 2024-06-21 LAB — MICROALBUMIN / CREATININE URINE RATIO
Creatinine, Urine: 200.3 mg/dL
Microalb/Creat Ratio: 21 mg/g{creat} (ref 0–29)
Microalbumin, Urine: 42.1 ug/mL

## 2024-06-21 LAB — HEMOGLOBIN A1C
Est. average glucose Bld gHb Est-mCnc: 123 mg/dL
Hgb A1c MFr Bld: 5.9 % — ABNORMAL HIGH (ref 4.8–5.6)

## 2024-06-22 ENCOUNTER — Other Ambulatory Visit: Payer: Self-pay | Admitting: Cardiology

## 2024-09-10 ENCOUNTER — Ambulatory Visit: Attending: Cardiology | Admitting: Cardiology

## 2024-09-10 ENCOUNTER — Encounter: Payer: Self-pay | Admitting: Cardiology

## 2024-09-10 VITALS — BP 132/80 | HR 82 | Ht 68.0 in | Wt 246.0 lb

## 2024-09-10 DIAGNOSIS — I25119 Atherosclerotic heart disease of native coronary artery with unspecified angina pectoris: Secondary | ICD-10-CM

## 2024-09-10 DIAGNOSIS — I10 Essential (primary) hypertension: Secondary | ICD-10-CM | POA: Diagnosis not present

## 2024-09-10 DIAGNOSIS — E782 Mixed hyperlipidemia: Secondary | ICD-10-CM

## 2024-09-10 MED ORDER — NITROGLYCERIN 0.4 MG SL SUBL: 0.4000 mg | SUBLINGUAL_TABLET | SUBLINGUAL | 3 refills | Status: AC | PRN

## 2024-09-10 NOTE — Progress Notes (Signed)
    Cardiology Office Note  Date: 09/10/2024   ID: Jeremy Riley, DOB Jun 26, 1961, MRN [id number]  History of Present Illness: Jeremy Riley is a 63 y.o. male last seen in September 2024.  He is here for a routine visit.  Reports no angina or interval nitroglycerin  use, no change in stamina.  He is working 3 days a week at this point.  I went over his medications.  He reports compliance with current regimen.  Refill being provided for fresh bottle of nitroglycerin .  Most recent lab work shows LDL 58 on Lipitor 80 mg daily.  He continues to follow regularly with Dr. Alphonsa.  I reviewed his ECG today which shows normal sinus rhythm.  Physical Exam: VS:  BP 132/80 (BP Location: Left Arm, Cuff Size: Normal)   Pulse 82   Ht 5' 8 (1.727 m)   Wt 246 lb (111.6 kg)   SpO2 95%   BMI 37.40 kg/m , BMI Body mass index is 37.4 kg/m.  Wt Readings from Last 3 Encounters:  09/10/24 246 lb (111.6 kg)  06/06/24 247 lb (112 kg)  08/30/23 252 lb 8 oz (114.5 kg)    General: Patient appears comfortable at rest. HEENT: Conjunctiva and lids normal. Neck: Supple, no elevated JVP or carotid bruits. Lungs: Clear to auscultation, nonlabored breathing at rest. Cardiac: Regular rate and rhythm, no S3 or significant systolic murmur. Extremities: No pitting edema.  ECG:  An ECG dated 08/30/2023 was personally reviewed today and demonstrated:  Sinus rhythm.  Labwork: 06/20/2024: ALT 30; AST 26; BUN 18; Creatinine, Ser 1.14; Potassium 4.2; Sodium 139     Component Value Date/Time   CHOL 112 06/20/2024 0830   TRIG 155 (H) 06/20/2024 0830   HDL 27 (L) 06/20/2024 0830   CHOLHDL 4.1 06/20/2024 0830   CHOLHDL 3.3 09/01/2022 1229   VLDL 15 09/01/2022 1229   LDLCALC 58 06/20/2024 0830   Other Studies Reviewed Today:  No interval cardiac testing for review today.  Assessment and Plan:  1.  CAD status post DES to the distal circumflex in June 2020.  Follow-up Lexiscan  Myoview  in January 2022 was  consistent with inferior/inferolateral scar with mild peri-infarct ischemia, LVEF 64%.  He does not describe any interval angina or nitroglycerin  use, refill being provided for fresh bottle.  ECG is normal today.  Continue aspirin  81 mg daily and Lipitor 80 mg daily.   2.  Primary hypertension.  No change to current regimen, continue Coreg  3.125 mg twice daily and Hyzaar 100/12.5 mg daily.   3.  Mixed hyperlipidemia.  LDL 58 in July.  Continue Lipitor 80 mg daily.  Disposition:  Follow up 1 year.  Signed, Jayson JUDITHANN Sierras, M.D., F.A.C.C. [facility]

## 2024-09-10 NOTE — Patient Instructions (Signed)
 Medication Instructions:  Your physician recommends that you continue on your current medications as directed. Please refer to the Current Medication list given to you today.  *If you need a refill on your cardiac medications before your next appointment, please call your pharmacy*  Lab Work: NONE   If you have labs (blood work) drawn today and your tests are completely normal, you will receive your results only by: MyChart Message (if you have MyChart) OR A paper copy in the mail If you have any lab test that is abnormal or we need to change your treatment, we will call you to review the results.  Testing/Procedures: NONE   Follow-Up: At [facility], you and your health needs are our priority.  As part of our continuing mission to provide you with exceptional heart care, our providers are all part of one team.  This team includes your primary Cardiologist (physician) and Advanced Practice Providers or APPs (Physician Assistants and Nurse Practitioners) who all work together to provide you with the care you need, when you need it.  Your next appointment:   1 year(s)  Provider:   You may see Teddie Favre, MD or one of the following Advanced Practice Providers on your designated Care Team:   Woodfin Hays, PA-C  [place], [place] Theotis Flake, [place]     We recommend signing up for the patient portal called "MyChart".  Sign up information is provided on this After Visit Summary.  MyChart is used to connect with patients for Virtual Visits (Telemedicine).  Patients are able to view lab/test results, encounter notes, upcoming appointments, etc.  Non-urgent messages can be sent to your provider as well.   To learn more about what you can do with MyChart, go to [email or web address].   Other Instructions Thank you for choosing [facility]!

## 2024-09-18 ENCOUNTER — Other Ambulatory Visit: Payer: Self-pay | Admitting: Cardiology

## 2024-09-18 ENCOUNTER — Other Ambulatory Visit: Payer: Self-pay | Admitting: Student

## 2024-09-19 ENCOUNTER — Other Ambulatory Visit: Payer: Self-pay | Admitting: Cardiology

## 2024-11-14 DIAGNOSIS — J029 Acute pharyngitis, unspecified: Secondary | ICD-10-CM | POA: Diagnosis not present

## 2024-11-25 DIAGNOSIS — J029 Acute pharyngitis, unspecified: Secondary | ICD-10-CM | POA: Diagnosis not present

## 2025-01-09 ENCOUNTER — Encounter: Payer: Self-pay | Admitting: Family Medicine

## 2025-01-09 ENCOUNTER — Ambulatory Visit: Admitting: Family Medicine

## 2025-01-09 ENCOUNTER — Ambulatory Visit: Payer: Self-pay

## 2025-01-09 VITALS — BP 129/85 | HR 97 | Temp 99.0°F | Ht 68.0 in | Wt 244.6 lb

## 2025-01-09 DIAGNOSIS — R0600 Dyspnea, unspecified: Secondary | ICD-10-CM

## 2025-01-09 DIAGNOSIS — J988 Other specified respiratory disorders: Secondary | ICD-10-CM | POA: Diagnosis not present

## 2025-01-09 DIAGNOSIS — B9789 Other viral agents as the cause of diseases classified elsewhere: Secondary | ICD-10-CM

## 2025-01-09 MED ORDER — OSELTAMIVIR PHOSPHATE 75 MG PO CAPS: 75.0000 mg | ORAL_CAPSULE | Freq: Two times a day (BID) | ORAL | 0 refills | Status: AC

## 2025-01-09 MED ORDER — ALBUTEROL SULFATE HFA 108 (90 BASE) MCG/ACT IN AERS: 1.0000 | INHALATION_SPRAY | Freq: Four times a day (QID) | RESPIRATORY_TRACT | 1 refills | Status: AC | PRN

## 2025-01-09 MED ORDER — IPRATROPIUM BROMIDE 0.06 % NA SOLN: 2.0000 | Freq: Four times a day (QID) | NASAL | 0 refills | Status: AC | PRN

## 2025-01-09 NOTE — Assessment & Plan Note (Addendum)
 Acute illness with systemic symptoms given fever.  Discussed testing.  Patient deferred.  Concern for influenza.  Covering with Tamiflu .  Albuterol  refilled.  Atrovent  for upper respiratory symptoms.

## 2025-01-09 NOTE — Telephone Encounter (Signed)
 FYI Only or Action Required?: FYI only for provider: appointment scheduled on 01/09/25.  Patient was last seen in primary care on 06/06/2024 by Alphonsa Glendia LABOR, MD.  Called Nurse Triage reporting Cough.  Symptoms began several days ago.  Interventions attempted: OTC medications: Nyquil, Mucinex.  Symptoms are: stable.  Triage Disposition: Home Care  Patient/caregiver understands and will follow disposition?: Yes  Reason for Disposition  Cough with cold symptoms (e.g., runny nose, postnasal drip, throat clearing)  Answer Assessment - Initial Assessment Questions Pt has taken Nyquil, Mucinex. Advised home care for symptoms, but also stated if pt would like an appointment I'd be more than happy to set one up, patient was agreeable to an appointment.   1. ONSET: When did the cough begin?      2 days ago  2. SEVERITY: How bad is the cough today?      Worsening  3. SPUTUM: Describe the color of your sputum (e.g., none, dry cough; clear, white, yellow, green)     Thick, creamy color  4. HEMOPTYSIS: Are you coughing up any blood? If Yes, ask: How much? (e.g., flecks, streaks, tablespoons, etc.)     Denies  5. DIFFICULTY BREATHING: Are you having difficulty breathing? If Yes, ask: How bad is it? (e.g., mild, moderate, severe)      Denies  6. FEVER: Do you have a fever? If Yes, ask: What is your temperature, how was it measured, and when did it start?     100.6 last night before bed, orally  7. CARDIAC HISTORY: Do you have any history of heart disease? (e.g., heart attack, congestive heart failure)      Hypertension, CAD  8. LUNG HISTORY: Do you have any history of lung disease?  (e.g., pulmonary embolus, asthma, emphysema)     Denies  9. OTHER SYMPTOMS: Do you have any other symptoms? (e.g., runny nose, wheezing, chest pain)       Runny nose, mild nasal congestion, chest congestion. Denies chest pain, SOB or wheezing + any other symptoms.  Protocols used:  Cough - Acute Productive-A-AH  Message from Ravenden T sent at 01/09/2025  8:05 AM EST  Summary: Productive cough with colored mucuos   Reason for Triage: pt reports has a chest cold, congeston in head and chest with productive coughing and colored mucous.  Pt requesting an appt for evaluation.

## 2025-01-09 NOTE — Progress Notes (Addendum)
 "  Subjective:  Patient ID: Jeremy Riley, male    DOB: October 24, 1961  Age: 64 y.o. MRN: [id number]  CC:   Chief Complaint  Patient presents with   Nasal Congestion    Head/Chest congestion, cough, and temp at home (100.6 last night) since Monday. States he was around his daughter who was sick.   Refill for albuterol  pending per patient request.    HPI:  64 year old male presents for evaluation of the above.  Patient reports he has been sick for the past few days.  Started on Monday.  He reports congestion.  Mostly nasal congestion.  Has had some cough.  Has been febrile, Tmax 100.6.  Has had recent exposure to someone whose been sick.  Some body aches.  Has taken NyQuil and Mucinex without resolution.  Patient Active Problem List   Diagnosis Date Noted   Viral respiratory infection 01/09/2025   Benign prostatic hyperplasia with urinary obstruction 04/05/2021   Weak urinary stream 04/05/2021   Nephrolithiasis 07/20/2020   Lumbar pain 05/18/2020   Hyperlipidemia 05/23/2019   Pre-diabetes 05/23/2019   CAD S/P percutaneous coronary angioplasty 05/23/2019   HTN (hypertension) 10/03/2017    Social Hx   Social History   Socioeconomic History   Marital status: Married    Spouse name: Not on file   Number of children: 3   Years of education: Not on file   Highest education level: Not on file  Occupational History   Not on file  Tobacco Use   Smoking status: Former    Current packs/day: 0.00    Types: Cigarettes    Start date: 01/08/1979    Quit date: 01/08/2009    Years since quitting: 16.0   Smokeless tobacco: Current    Types: Chew  Vaping Use   Vaping status: Every Day   Substances: Nicotine, Flavoring  Substance and Sexual Activity   Alcohol use: Yes    Comment: occ   Drug use: Not Currently   Sexual activity: Not on file  Other Topics Concern   Not on file  Social History Narrative   Not on file   Social Drivers of Health   Tobacco Use: High Risk (01/09/2025)    Patient History    Smoking Tobacco Use: Former    Smokeless Tobacco Use: Current    Passive Exposure: Not on Actuary Strain: Not on file  Food Insecurity: Not on file  Transportation Needs: Not on file  Physical Activity: Not on file  Stress: Not on file  Social Connections: Not on file  Depression (PHQ2-9): Low Risk (01/09/2025)   Depression (PHQ2-9)    PHQ-2 Score: 0  Alcohol Screen: Not on file  Housing: Not on file  Utilities: Not on file  Health Literacy: Not on file    Review of Systems Per HPI  Objective:  BP 129/85   Pulse 97   Temp 99 F (37.2 C)   Ht 5' 8 (1.727 m)   Wt 244 lb 9.6 oz (110.9 kg)   SpO2 94%   BMI 37.19 kg/m      01/09/2025   11:03 AM 09/10/2024    8:21 AM 06/06/2024    1:33 PM  BP/Weight  Systolic BP 129 132 126  Diastolic BP 85 80 68  Wt. (Lbs) 244.6 246   BMI 37.19 kg/m2 37.4 kg/m2     Physical Exam Vitals and nursing note reviewed.  Constitutional:      General: He is not in acute distress.  Appearance: Normal appearance.  HENT:     Head: Normocephalic and atraumatic.     Nose: Congestion present.  Cardiovascular:     Rate and Rhythm: Regular rhythm. Tachycardia present.  Pulmonary:     Effort: Pulmonary effort is normal.     Breath sounds: Normal breath sounds. No wheezing, rhonchi or rales.  Neurological:     Mental Status: He is alert.  Psychiatric:        Mood and Affect: Mood normal.        Behavior: Behavior normal.     Lab Results  Component Value Date   WBC 5.8 09/01/2022   HGB 14.6 09/01/2022   HCT 40.3 09/01/2022   PLT 214 09/01/2022   GLUCOSE 119 (H) 06/20/2024   CHOL 112 06/20/2024   TRIG 155 (H) 06/20/2024   HDL 27 (L) 06/20/2024   LDLCALC 58 06/20/2024   ALT 30 06/20/2024   AST 26 06/20/2024   NA 139 06/20/2024   K 4.2 06/20/2024   CL 100 06/20/2024   CREATININE 1.14 06/20/2024   BUN 18 06/20/2024   CO2 23 06/20/2024   TSH 1.605 12/30/2020   HGBA1C 5.9 (H) 06/20/2024      Assessment & Plan:  Viral respiratory infection Assessment & Plan: Acute illness with systemic symptoms given fever.  Discussed testing.  Patient deferred.  Concern for influenza.  Covering with Tamiflu .  Albuterol  refilled.  Atrovent  for upper respiratory symptoms.  Orders: -     Oseltamivir  Phosphate; Take 1 capsule (75 mg total) by mouth 2 (two) times daily.  Dispense: 10 capsule; Refill: 0 -     Ipratropium Bromide ; Place 2 sprays into both nostrils 4 (four) times daily as needed for rhinitis.  Dispense: 15 mL; Refill: 0  Dyspnea, unspecified type -     Albuterol  Sulfate HFA; Inhale 1-2 puffs into the lungs every 6 (six) hours as needed for wheezing or shortness of breath.  Dispense: 18 each; Refill: 1    Follow-up:  Return if symptoms worsen or fail to improve.  Jacqulyn Ahle DO [facility] "

## 2025-01-09 NOTE — Patient Instructions (Signed)
 Medications as directed.  If symptoms continue to persist, please let us  know.

## 2025-01-09 NOTE — Telephone Encounter (Signed)
 Patient has appointment today @ 01/09/2025

## 2025-05-07 ENCOUNTER — Ambulatory Visit: Admitting: Urology
